# Patient Record
Sex: Male | Born: 1955
Health system: Southern US, Community
[De-identification: ages and names within clinical notes are randomized; demographics above are authoritative.]

## PROBLEM LIST (undated history)

## (undated) ENCOUNTER — Encounter

## (undated) ENCOUNTER — Telehealth

## (undated) ENCOUNTER — Encounter: Attending: Infectious Disease | Primary: Infectious Disease

## (undated) ENCOUNTER — Ambulatory Visit

## (undated) ENCOUNTER — Telehealth: Attending: Clinical | Primary: Clinical

## (undated) ENCOUNTER — Ambulatory Visit: Payer: MEDICARE | Attending: Infectious Disease | Primary: Infectious Disease

## (undated) ENCOUNTER — Ambulatory Visit: Payer: Medicare (Managed Care) | Attending: Infectious Disease | Primary: Infectious Disease

## (undated) ENCOUNTER — Encounter: Payer: MEDICARE | Attending: Infectious Disease | Primary: Infectious Disease

## (undated) DIAGNOSIS — B20 Human immunodeficiency virus [HIV] disease: Secondary | ICD-10-CM

## (undated) DIAGNOSIS — M1990 Unspecified osteoarthritis, unspecified site: Secondary | ICD-10-CM

## (undated) DIAGNOSIS — K219 Gastro-esophageal reflux disease without esophagitis: Secondary | ICD-10-CM

## (undated) DIAGNOSIS — Z21 Asymptomatic human immunodeficiency virus [HIV] infection status: Secondary | ICD-10-CM

## (undated) DIAGNOSIS — F329 Major depressive disorder, single episode, unspecified: Secondary | ICD-10-CM

## (undated) DIAGNOSIS — F32A Depression, unspecified: Secondary | ICD-10-CM

## (undated) DIAGNOSIS — K5792 Diverticulitis of intestine, part unspecified, without perforation or abscess without bleeding: Secondary | ICD-10-CM

## (undated) DIAGNOSIS — I10 Essential (primary) hypertension: Secondary | ICD-10-CM

## (undated) DIAGNOSIS — H9192 Unspecified hearing loss, left ear: Secondary | ICD-10-CM

## (undated) HISTORY — PX: CARPAL TUNNEL RELEASE: SHX101

## (undated) HISTORY — PX: WISDOM TOOTH EXTRACTION: SHX21

## (undated) HISTORY — DX: Asymptomatic human immunodeficiency virus (hiv) infection status: Z21

## (undated) HISTORY — DX: Diverticulitis of intestine, part unspecified, without perforation or abscess without bleeding: K57.92

## (undated) HISTORY — DX: Essential (primary) hypertension: I10

## (undated) HISTORY — PX: COLONOSCOPY: SHX174

## (undated) HISTORY — DX: Human immunodeficiency virus (HIV) disease: B20

---

## 2008-02-16 ENCOUNTER — Ambulatory Visit: Payer: Self-pay | Admitting: Cardiology

## 2008-03-11 ENCOUNTER — Encounter: Payer: Self-pay | Admitting: Cardiology

## 2008-03-11 ENCOUNTER — Ambulatory Visit: Payer: Self-pay

## 2009-04-09 ENCOUNTER — Encounter: Admission: RE | Admit: 2009-04-09 | Discharge: 2009-04-09 | Payer: Self-pay | Admitting: Orthopaedic Surgery

## 2011-02-01 ENCOUNTER — Other Ambulatory Visit: Payer: Self-pay | Admitting: Family Medicine

## 2011-02-03 ENCOUNTER — Ambulatory Visit
Admission: RE | Admit: 2011-02-03 | Discharge: 2011-02-03 | Disposition: A | Discharge status: Home / Self Care | Payer: PRIVATE HEALTH INSURANCE | Source: Ambulatory Visit | Attending: Family Medicine | Admitting: Family Medicine

## 2011-04-06 NOTE — Assessment & Plan Note (Signed)
[facility]                            CARDIOLOGY OFFICE NOTE   LONN, IM                        MRN:          [id number]  DATE:02/16/2008                            DOB:          05/31/1956    Mr. Richard Velazquez is a pleasant 55 year old gentleman with past medical history  of HIV who presents for evaluation of hypertension and dyspnea.  The  patient states he has had an irregular heartbeat occasionally in the  past.  He is from [place].  However, over the past 6 months he states  his blood pressure has been in the 140/90 to to 150/100 range.  However  over the past 2 weeks it has returned to normal.  When his blood  pressure is elevated, he has complained of dyspnea on exertion which is  new for him.  There is no orthopnea, PND, pedal edema, presyncope,  syncope or chest pain.  He does occasionally have palpitations that are  not sustained.  Because of his blood pressure and dyspnea, he was  referred to be evaluated.  His medications include multiple supplements.  He is also on Norvir 100 mg daily, Viread 300 mg daily, Rayataz 300 mg  daily, and Epzicom 900 mg daily. He also takes lorazepam as needed,  Lomotil as needed, and ibuprofen as needed.  He has no known drug  allergies.   SOCIAL HISTORY:  Does not smoke or use drugs.  He rarely consumes  alcohol.  He obtained his HIV through sex by his report.  There has been  no blood transfusion.   FAMILY HISTORY:  Significant for congestive heart failure as well as  atrial fibrillation.   PAST MEDICAL HISTORY:  There is no diabetes mellitus or hyperlipidemia.  He has had elevated blood pressure in the past 6 months but not over the  past 2 weeks.  He has had pleurisy in the past.  He has had reflux as  well as diverticula.  He has had his wisdom teeth removed but no other  surgeries have been noted.  There is no other past medical history  noted.   REVIEW OF SYSTEMS:  He denies any fevers or chills  but he has had  occasional headaches.  There is no productive cough or hemoptysis.  No  dysphagia, odynophagia, melena or hematochezia.  There is no dysuria  inch of his or seizure activity.  There is no orthopnea, PND or pedal  edema.  He does occasionally some pain in his lower extremities.  Remaining systems are negative.   PHYSICAL EXAMINATION:  Today shows a blood pressure of 120/80.  His  pulse is 74, weight is 173 pounds.  He is well-developed, well-nourished distress.  Skin is warm, dry.  Does not appear depressed.  There is no peripheral  clubbing.  BACK:  Normal.  HEENT:  Normal.  Normal eyelids.  Neck is supple with a normal upstroke bilaterally.  No bruits noted.  There is no jugular distention and no thyromegaly.  Chest is clear to auscultation.  There is normal expansion.  CARDIOVASCULAR:  Regular rhythm.  Normal  S1-S2.  There are no murmurs,  rubs or gallops noted.  Abdominal series shows no tenderness to palpation.  There is no  hepatosplenomegaly.  I cannot palpate masses and there are no bruits  noted.  He is 2+ femoral pulses bilaterally bruits.  EXTREMITIES:  Show no edema palpate no cords.  He is 2+ dorsalis pedis  pulses bilaterally.  NEUROLOGIC:  Grossly intact.   Electrocardiogram shows sinus rhythm at 74.  There are no ST changes  noted.   DIAGNOSES:  1. Hypertension - Mr. Commisso states his blood pressure was in 140/90 to      150/100 for the preceding 6 months but in the past 2 weeks it has      improved.  He is now 120/80.  I have asked him to track this at      home and we can implement therapy if his diastolic runs greater      than 85 or systolic runs greater than 140.  He already observes      lifestyle modification (he does not consume significant amounts of      alcohol, he follows a strict diet, and does exercise occasionally).      I have asked him to contact us and if his blood pressure runs high      we could consider adding a beta blocker  both for hypertension and      his history of palpitations.  2. Dyspnea on exertion - we will schedule him to have stress      echocardiogram both to rule out coronary disease and quantify his      LV function (he does have HIV and we would need to exclude an HIV      cardiomyopathy, although I think is unlikely).  Note, he did travel      to [place] recently, but he states his dyspnea began well before      then and I therefore think pulmonary embolism is unlikely.  It      would not explain his symptoms as his travel occurred after the      onset of his symptoms.  He does have a history of a pulmonary      embolus 30 years ago.  3. Human immunodeficiency virus - managed per his infectious disease      specialist.   We will see him back on as-needed basis.     Madolyn Frieze Jens Som, MD, [facility]  Electronically Signed    BSC/MedQ  DD: 02/16/2008  DT: 02/17/2008  Job #: [id number]   cc:   Allena Napoleon

## 2012-06-07 ENCOUNTER — Other Ambulatory Visit: Payer: Self-pay | Admitting: Family Medicine

## 2012-06-07 ENCOUNTER — Ambulatory Visit (INDEPENDENT_AMBULATORY_CARE_PROVIDER_SITE_OTHER): Payer: PRIVATE HEALTH INSURANCE | Admitting: Family Medicine

## 2012-06-07 VITALS — BP 128/81 | HR 83 | Temp 97.7°F | Resp 17 | Ht 70.0 in | Wt 162.0 lb

## 2012-06-07 DIAGNOSIS — B20 Human immunodeficiency virus [HIV] disease: Secondary | ICD-10-CM

## 2012-06-07 DIAGNOSIS — Z21 Asymptomatic human immunodeficiency virus [HIV] infection status: Secondary | ICD-10-CM

## 2012-06-07 DIAGNOSIS — Z113 Encounter for screening for infections with a predominantly sexual mode of transmission: Secondary | ICD-10-CM

## 2012-06-07 DIAGNOSIS — R197 Diarrhea, unspecified: Secondary | ICD-10-CM

## 2012-06-07 DIAGNOSIS — R109 Unspecified abdominal pain: Secondary | ICD-10-CM

## 2012-06-07 LAB — POCT CBC
Granulocyte percent: 54.1 %G (ref 37–80)
HCT, POC: 49.5 % (ref 43.5–53.7)
Hemoglobin: 15.7 g/dL (ref 14.1–18.1)
Lymph, poc: 2.1 (ref 0.6–3.4)
MCH, POC: 30.8 pg (ref 27–31.2)
MCHC: 31.7 g/dL — AB (ref 31.8–35.4)
MCV: 97.2 fL — AB (ref 80–97)
MID (cbc): 0.6 (ref 0–0.9)
MPV: 10.1 fL (ref 0–99.8)
POC Granulocyte: 3.2 (ref 2–6.9)
POC LYMPH PERCENT: 36.1 %L (ref 10–50)
POC MID %: 9.8 %M (ref 0–12)
Platelet Count, POC: 201 10*3/uL (ref 142–424)
RBC: 5.09 M/uL (ref 4.69–6.13)
RDW, POC: 13.4 %
WBC: 5.9 10*3/uL (ref 4.6–10.2)

## 2012-06-07 NOTE — Progress Notes (Signed)
 [facility]:  Office Visit  Chief Complaint:  Chief Complaint  Patient presents with  . Abdominal Pain  . Fever  . Generalized Body Aches  . HIV Positive/AIDS    HPI: Richard Velazquez is a 56 y.o. male who complains of non-bloody diarrhea, since Monday, x 3x daily, took 1/2 lomotil. Stool nl-loose, brown colored.  Subjective fevers, generalized muscle aches. Took Ibuprofen with some relief. Feels better this AM. + quesy. Went to eat at buffet at [employer], chicken salad, asparagus on side and broccoli side , started within 3 hours of ingection of food.   Would like to do STD testing because is not monogamous. + hiv viral load unknown, cd4 >500  Past Medical History  Diagnosis Date  . HIV infection   . Diverticulitis   . Hypertension    History reviewed. No pertinent past surgical history. History   Social History  . Marital Status: Single    Spouse Name: N/A    Number of Children: N/A  . Years of Education: N/A   Social History Main Topics  . Smoking status: Never Smoker   . Smokeless tobacco: None  . Alcohol Use: Yes  . Drug Use: No  . Sexually Active: Yes   Other Topics Concern  . None   Social History Narrative  . None   No family history on file. Allergies  Allergen Reactions  . Clindamycin/Lincomycin Other (See Comments)    Tendonitis   . Levaquin (Levofloxacin In D5w) Other (See Comments)    Tendonitis     Prior to Admission medications   Medication Sig Start Date End Date Taking? Authorizing Provider  Ascorbic Acid (VITAMIN C) 100 MG tablet Take 100 mg by mouth daily.   Yes Historical Provider, MD  atazanavir (REYATAZ) 300 MG capsule Take 300 mg by mouth daily with breakfast.   Yes Historical Provider, MD  buPROPion (WELLBUTRIN SR) 150 MG 12 hr tablet Take 150 mg by mouth 2 (two) times daily.   Yes Historical Provider, MD  lisinopril-hydrochlorothiazide (PRINZIDE,ZESTORETIC) 10-12.5 MG per tablet Take 1 tablet by mouth daily.   Yes  Historical Provider, MD  ritonavir (NORVIR) 100 MG capsule Take by mouth 2 (two) times daily.   Yes Historical Provider, MD  tenofovir (VIREAD) 300 MG tablet Take 300 mg by mouth daily.   Yes Historical Provider, MD  vitamin B-12 (CYANOCOBALAMIN) 50 MCG tablet Take 50 mcg by mouth daily.   Yes Historical Provider, MD  Vitamin D, Ergocalciferol, (DRISDOL) 50000 UNITS CAPS Take 50,000 Units by mouth.   Yes Historical Provider, MD     ROS: The patient denies chills, night sweats, unintentional weight loss, chest pain, palpitations, wheezing, dyspnea on exertion,  vomiting, dysuria, hematuria, melena, numbness, weakness, or tingling.   All other systems have been reviewed and were otherwise negative with the exception of those mentioned in the HPI and as above.    PHYSICAL EXAM: Filed Vitals:   06/07/12 0853  BP: 128/81  Pulse: 83  Temp: 97.7 F (36.5 C)  Resp: 17   Filed Vitals:   06/07/12 0853  Height: 5\' 10"  (1.778 m)  Weight: 162 lb (73.483 kg)   Body mass index is 23.24 kg/(m^2).  General: Alert, no acute distress HEENT:  Normocephalic, atraumatic, oropharynx patent.  Cardiovascular:  Regular rate and rhythm, no rubs murmurs or gallops.  No Carotid bruits, radial pulse intact. No pedal edema.  Respiratory: Clear to auscultation bilaterally.  No wheezes, rales, or rhonchi.  No cyanosis,  no use of accessory musculature GI: No organomegaly, abdomen is soft and non-tender, positive bowel sounds.  No masses. Skin: No rashes. Neurologic: Facial musculature symmetric. Psychiatric: Patient is appropriate throughout our interaction. Lymphatic: No cervical lymphadenopathy Musculoskeletal: Gait intact.   LABS: Results for orders placed in visit on 06/07/12  POCT CBC      Component Value Range   WBC 5.9  4.6 - 10.2 K/uL   Lymph, poc 2.1  0.6 - 3.4   POC LYMPH PERCENT 36.1  10 - 50 %L   MID (cbc) 0.6  0 - 0.9   POC MID % 9.8  0 - 12 %M   POC Granulocyte 3.2  2 - 6.9    Granulocyte percent 54.1  37 - 80 %G   RBC 5.09  4.69 - 6.13 M/uL   Hemoglobin 15.7  14.1 - 18.1 g/dL   HCT, POC 16.1  09.6 - 53.7 %   MCV 97.2 (*) 80 - 97 fL   MCH, POC 30.8  27 - 31.2 pg   MCHC 31.7 (*) 31.8 - 35.4 g/dL   RDW, POC 04.5     Platelet Count, POC 201  142 - 424 K/uL   MPV 10.1  0 - 99.8 fL     EKG/XRAY:   Primary read interpreted by Dr. Conley Rolls at [facility].   ASSESSMENT/PLAN: Encounter Diagnoses  Name Primary?  . Abdominal  pain, other specified site Yes  . Diarrhea   . HIV (human immunodeficiency virus infection)   . Screening for STD (sexually transmitted disease)     [facility] labs: CMP, stool cx, RPR, G/C uriprobe Viral vs bacterial gastroenteritis- will continue to monitor. BRAT. F/u prn if sxs worsen.   ,  PHUONG, DO 06/07/2012 11:18 AM

## 2012-06-15 ENCOUNTER — Telehealth: Payer: Self-pay

## 2012-06-15 ENCOUNTER — Other Ambulatory Visit (INDEPENDENT_AMBULATORY_CARE_PROVIDER_SITE_OTHER): Payer: PRIVATE HEALTH INSURANCE | Admitting: Family Medicine

## 2012-06-15 ENCOUNTER — Other Ambulatory Visit: Payer: Self-pay | Admitting: Family Medicine

## 2012-06-15 ENCOUNTER — Telehealth: Payer: Self-pay | Admitting: Family Medicine

## 2012-06-15 DIAGNOSIS — R899 Unspecified abnormal finding in specimens from other organs, systems and tissues: Secondary | ICD-10-CM

## 2012-06-15 DIAGNOSIS — Z113 Encounter for screening for infections with a predominantly sexual mode of transmission: Secondary | ICD-10-CM

## 2012-06-15 DIAGNOSIS — R6889 Other general symptoms and signs: Secondary | ICD-10-CM

## 2012-06-15 LAB — COMPREHENSIVE METABOLIC PANEL
Albumin/Globulin Ratio: 2 (ref 1.1–2.5)
Albumin: 4.7 g/dL (ref 3.5–5.5)
Alkaline Phosphatase: 107 IU/L — ABNORMAL HIGH (ref 44–102)
BUN/Creatinine Ratio: 16 (ref 9–20)
BUN: 19 mg/dL (ref 6–24)
CO2: 26 mmol/L (ref 19–28)
Creatinine, Ser: 1.19 mg/dL (ref 0.76–1.27)
GFR calc non Af Amer: 68 mL/min/{1.73_m2} (ref >59)
Globulin, Total: 2.3 g/dL (ref 1.5–4.5)

## 2012-06-15 LAB — GC/CHLAMYDIA PROBE AMP

## 2012-06-15 LAB — COMPREHENSIVE METABOLIC PANEL WITH GFR
ALT: 27 IU/L (ref 0–44)
AST: 24 IU/L (ref 0–40)
Calcium: 9.9 mg/dL (ref 8.7–10.2)
Chloride: 100 mmol/L (ref 97–108)
GFR calc Af Amer: 79 mL/min/{1.73_m2} (ref >59)
Glucose: 100 mg/dL — ABNORMAL HIGH (ref 65–99)
Potassium: 4.2 mmol/L (ref 3.5–5.2)
Sodium: 137 mmol/L (ref 134–144)
Total Bilirubin: 2.4 mg/dL — ABNORMAL HIGH (ref 0.0–1.2)
Total Protein: 7 g/dL (ref 6.0–8.5)

## 2012-06-15 LAB — STOOL CULTURE: E coli, Shiga toxin Assay: NEGATIVE

## 2012-06-15 LAB — TEST CODE CHANGE

## 2012-06-15 LAB — RPR: RPR: NONREACTIVE

## 2012-06-15 LAB — SPECIMEN STATUS REPORT

## 2012-06-15 NOTE — Telephone Encounter (Signed)
Patient will be coming in to get repeat labs today : GC urine and CMP. [facility] only. Order already put in. Does not need office visit. GC last collected deteriorated in transmit.  Repeat CMP for abnl results.

## 2012-06-21 ENCOUNTER — Other Ambulatory Visit: Payer: Self-pay | Admitting: Family Medicine

## 2012-06-21 DIAGNOSIS — R197 Diarrhea, unspecified: Secondary | ICD-10-CM

## 2012-06-21 MED ORDER — DIPHENOXYLATE-ATROPINE 2.5-0.025 MG PO TABS: 1.0000 | ORAL_TABLET | Freq: Four times a day (QID) | ORAL | Status: AC | PRN

## 2012-06-22 LAB — COMPREHENSIVE METABOLIC PANEL WITH GFR
ALT: 23 IU/L (ref 0–44)
Albumin/Globulin Ratio: 2 (ref 1.1–2.5)
Albumin: 4.5 g/dL (ref 3.5–5.5)
BUN: 17 mg/dL (ref 6–24)
CO2: 24 mmol/L (ref 19–28)
Calcium: 9.7 mg/dL (ref 8.7–10.2)
Creatinine, Ser: 1.11 mg/dL (ref 0.76–1.27)
GFR calc non Af Amer: 74 mL/min/{1.73_m2} (ref >59)
Globulin, Total: 2.2 g/dL (ref 1.5–4.5)
Sodium: 138 mmol/L (ref 134–144)
Total Protein: 6.7 g/dL (ref 6.0–8.5)

## 2012-06-22 LAB — AMBIG ABBREV CMP14 DEFAULT

## 2012-06-22 LAB — COMPREHENSIVE METABOLIC PANEL
AST: 23 IU/L (ref 0–40)
Alkaline Phosphatase: 104 IU/L — ABNORMAL HIGH (ref 44–102)
BUN/Creatinine Ratio: 15 (ref 9–20)
Chloride: 100 mmol/L (ref 97–108)
GFR calc Af Amer: 86 mL/min/{1.73_m2} (ref >59)
Glucose: 90 mg/dL (ref 65–99)
Potassium: 4.1 mmol/L (ref 3.5–5.2)
Total Bilirubin: 1.8 mg/dL — ABNORMAL HIGH (ref 0.0–1.2)

## 2012-06-22 LAB — GC/CHLAMYDIA PROBE AMP
Chlamydia trachomatis, NAA: NEGATIVE
Neisseria gonorrhoeae by PCR: NEGATIVE

## 2012-07-10 NOTE — Telephone Encounter (Signed)
ERROR

## 2012-07-18 ENCOUNTER — Ambulatory Visit (INDEPENDENT_AMBULATORY_CARE_PROVIDER_SITE_OTHER): Payer: PRIVATE HEALTH INSURANCE | Admitting: Family Medicine

## 2012-07-18 VITALS — BP 109/72 | HR 76 | Temp 97.9°F | Resp 18 | Ht 70.0 in | Wt 166.0 lb

## 2012-07-18 DIAGNOSIS — N489 Disorder of penis, unspecified: Secondary | ICD-10-CM

## 2012-07-18 DIAGNOSIS — N4889 Other specified disorders of penis: Secondary | ICD-10-CM

## 2012-07-18 DIAGNOSIS — B20 Human immunodeficiency virus [HIV] disease: Secondary | ICD-10-CM

## 2012-07-18 DIAGNOSIS — J029 Acute pharyngitis, unspecified: Secondary | ICD-10-CM

## 2012-07-18 LAB — POCT RAPID STREP A (OFFICE): Rapid Strep A Screen: NEGATIVE

## 2012-07-18 MED ORDER — AMOXICILLIN 500 MG PO CAPS: 500.0000 mg | ORAL_CAPSULE | Freq: Three times a day (TID) | ORAL | Status: AC

## 2012-07-18 MED ORDER — MUPIROCIN 2 % EX OINT: TOPICAL_OINTMENT | Freq: Three times a day (TID) | CUTANEOUS | Status: AC

## 2012-07-18 NOTE — Patient Instructions (Addendum)
1. Pharyngitis  POCT rapid strep A, Culture, Group A Strep, amoxicillin (AMOXIL) 500 MG capsule  2. Penile lesion  Herpes culture, rapid, mupirocin ointment (BACTROBAN) 2 %

## 2012-07-18 NOTE — Progress Notes (Signed)
Subjective:    Patient ID: Richard Velazquez, male    DOB: 1956-04-24, 56 y.o.   MRN: [id number]  HPI This 56 y.o. male presents for evaluation of the following:  1.  Sore throat:  Onset of R sided sore throat yesterday; actually pain improved today 25%; [place] and felt like needed to be evaluated.  Gland also tender on L.  No fever/chills/sweats.  No headache; no ear pain but +ear itching intermittently.  No nasal congestion, rhinorrhea, cough.  No malaise.  No nausea, vomiting, rash.    2.  Penile skin lesions:  Has appointment with dermatology next week to evaluate lesion on penis that has been present for 18 months;.  Small spot that has been there for a very long time.  Protopic cream prescribed by dermatology; has applied to area.  Also has an area that is sore and not healing properly.  No drainage.  +new sexual partners; no penetration; not worried about STD; worried about abrasions.  Onset of abrasion 3 days ago; less sore now than with onset.  Has been applying topical antibacterial cream to area with improvement/decreased pain.  Not circumcised so has constant moisture under foreskin.  3.  HIV: very active sexually/intimately yet no penetration.  Tries to be careful.      Review of Systems  Constitutional: Negative for fever, chills, diaphoresis and fatigue.  HENT: Positive for ear pain, sore throat and neck pain. Negative for hearing loss, congestion, rhinorrhea, sneezing, mouth sores, trouble swallowing, neck stiffness, voice change, postnasal drip and ear discharge.   Respiratory: Negative for cough and shortness of breath.   Gastrointestinal: Negative for abdominal pain.  Genitourinary: Positive for genital sores and penile pain. Negative for dysuria, frequency, hematuria, flank pain, discharge, penile swelling, scrotal swelling, difficulty urinating and testicular pain.  Skin: Positive for wound.    Past Medical History  Diagnosis Date  . HIV  infection   . Diverticulitis   . Hypertension     No past surgical history on file.  Prior to Admission medications   Medication Sig Start Date End Date Taking? Authorizing Provider  Ascorbic Acid (VITAMIN C) 100 MG tablet Take 100 mg by mouth daily.   Yes Historical Provider, MD  atazanavir (REYATAZ) 300 MG capsule Take 300 mg by mouth daily with breakfast.   Yes Historical Provider, MD  buPROPion (WELLBUTRIN SR) 150 MG 12 hr tablet Take 150 mg by mouth 2 (two) times daily.   Yes Historical Provider, MD  lisinopril-hydrochlorothiazide (PRINZIDE,ZESTORETIC) 10-12.5 MG per tablet Take 1 tablet by mouth daily.   Yes Historical Provider, MD  ritonavir (NORVIR) 100 MG capsule Take by mouth 2 (two) times daily.   Yes Historical Provider, MD  tenofovir (VIREAD) 300 MG tablet Take 300 mg by mouth daily.   Yes Historical Provider, MD  vitamin B-12 (CYANOCOBALAMIN) 50 MCG tablet Take 50 mcg by mouth daily.   Yes Historical Provider, MD  Vitamin D, Ergocalciferol, (DRISDOL) 50000 UNITS CAPS Take 50,000 Units by mouth.   Yes Historical Provider, MD  amoxicillin (AMOXIL) 500 MG capsule Take 1 capsule (500 mg total) by mouth 3 (three) times daily. 07/18/12 07/28/12  Ethelda Chick, MD  mupirocin ointment (BACTROBAN) 2 % Apply topically 3 (three) times daily. 07/18/12 07/25/12  Ethelda Chick, MD    Allergies  Allergen Reactions  . Clindamycin/Lincomycin Other (See Comments)    Tendonitis   . Levaquin (Levofloxacin In D5w) Other (See Comments)  Tendonitis      History   Social History  . Marital Status: Single    Spouse Name: N/A    Number of Children: N/A  . Years of Education: N/A   Occupational History  . Not on file.   Social History Main Topics  . Smoking status: Never Smoker   . Smokeless tobacco: Not on file  . Alcohol Use: Yes  . Drug Use: No  . Sexually Active: Yes   Other Topics Concern  . Not on file   Social History Narrative  . No narrative on file    No family  history on file.     Objective:   Physical Exam  Nursing note and vitals reviewed. Constitutional: He is oriented to person, place, and time. He appears well-developed and well-nourished. No distress.  HENT:  Head: Normocephalic and atraumatic.  Right Ear: External ear normal.  Left Ear: External ear normal.  Nose: Nose normal.  Mouth/Throat: Oropharynx is clear and moist and mucous membranes are normal. No oral lesions. No uvula swelling. No oropharyngeal exudate or tonsillar abscesses.       MILD ERYTHEMA R OP; NO EXUDATE; NO UVULA DEVIATION.  NO BUCCAL LESIONS.  Eyes: Conjunctivae and EOM are normal. Pupils are equal, round, and reactive to light.  Neck: Normal range of motion. Neck supple. No thyromegaly present.       MILD R ANTERIOR CERVICAL LAD.  Cardiovascular: Normal rate, regular rhythm, normal heart sounds and intact distal pulses.   Pulmonary/Chest: Effort normal and breath sounds normal.  Genitourinary: Uncircumcised. No penile erythema or penile tenderness. No discharge found.       SINGLE PAPULAR LESION L LATERAL PENILE SHAFT DIAMETER WITHOUT ULCERATION, VESICLE, PUSTULE, OR ERYTHEMA.  DISTAL PENILE SHAFT L, WITH SUPERFICIAL X ABRASION; NO ASSOCIATED ULCERATION, PUSTULE, OR VESICLE.   HSV CULTURE OBTAINED.  Lymphadenopathy:    He has cervical adenopathy.  Neurological: He is alert and oriented to person, place, and time. He has normal reflexes.  Skin: He is not diaphoretic.  Psychiatric: He has a normal mood and affect. His behavior is normal. Judgment and thought content normal.       Results for orders placed in visit on 07/18/12  POCT RAPID STREP A (OFFICE)      Component Value Range   Rapid Strep A Screen Negative  Negative    Assessment & Plan:   1. Pharyngitis  POCT rapid strep A, Culture, Group A Strep, amoxicillin (AMOXIL) 500 MG capsule  2. Penile lesion  mupirocin ointment (BACTROBAN) 2 %, Herpes simplex virus culture  3. HIV  positive   1.  Pharyngitis:  New.  Send throat culture.  Treat empirically with Amoxicillin.  If persists, will warrant throat sample for GC/Chlam. 2.  Penile Lesion: New. Two separate lesions.  Papular lesion  L lateral penile shaft; scheduled for evaluation by dermatology in upcoming week.  Superficial abrasion distal penile shaft; rx for Bactroban provided.  HSV culture also obtained.  RTC for acute worsening or lack of healing. 3.  HIV positive status: stable.

## 2012-07-19 NOTE — Progress Notes (Signed)
Reviewed and agree.

## 2012-07-20 LAB — CULTURE, GROUP A STREP: Organism ID, Bacteria: NORMAL

## 2012-07-20 LAB — HERPES SIMPLEX VIRUS CULTURE: Organism ID, Bacteria: NOT DETECTED

## 2013-02-10 ENCOUNTER — Ambulatory Visit (INDEPENDENT_AMBULATORY_CARE_PROVIDER_SITE_OTHER): Payer: PRIVATE HEALTH INSURANCE | Admitting: Family Medicine

## 2013-02-10 VITALS — BP 108/74 | HR 70 | Temp 98.2°F | Resp 16 | Ht 70.0 in | Wt 160.0 lb

## 2013-02-10 DIAGNOSIS — B029 Zoster without complications: Secondary | ICD-10-CM

## 2013-02-10 DIAGNOSIS — H6120 Impacted cerumen, unspecified ear: Secondary | ICD-10-CM

## 2013-02-10 DIAGNOSIS — H6121 Impacted cerumen, right ear: Secondary | ICD-10-CM

## 2013-02-10 DIAGNOSIS — B370 Candidal stomatitis: Secondary | ICD-10-CM

## 2013-02-10 MED ORDER — FLUCONAZOLE 150 MG PO TABS: 150.0000 mg | ORAL_TABLET | Freq: Once | ORAL | Status: DC

## 2013-02-10 MED ORDER — VALACYCLOVIR HCL 1 G PO TABS: 1000.0000 mg | ORAL_TABLET | Freq: Three times a day (TID) | ORAL | Status: DC

## 2013-02-10 NOTE — Progress Notes (Signed)
57 yo HIV patient with left groin and hip skin sensitivity starting last night. Last CD4 count was over 500. Throat has been husky with throat irritation x 1 month Hasn't been exercising this winter  Also having scaly irritated right ear with some popping Also having physical therapy for L4L5 disc bulging, right ear noise and itchiness.  Objective:  NAD  Mild adenopathy left groin without any significant rash. Right ear shows cerumen impaction Throat: few small exudates Ear was cleared on the right and 90% clear on left  Assessment: early thrush, shingles. Cerumen impaction  Plan:Oral thrush - Plan: fluconazole (DIFLUCAN) 150 MG tablet  Shingles - Plan: valACYclovir (VALTREX) 1000 MG tablet  Cerumen impaction, right   Oral thrush - Plan: fluconazole (DIFLUCAN) 150 MG tablet  Shingles - Plan: valACYclovir (VALTREX) 1000 MG tablet

## 2013-03-01 DIAGNOSIS — F329 Major depressive disorder, single episode, unspecified: Secondary | ICD-10-CM | POA: Insufficient documentation

## 2013-03-01 DIAGNOSIS — M5116 Intervertebral disc disorders with radiculopathy, lumbar region: Secondary | ICD-10-CM | POA: Insufficient documentation

## 2013-03-01 DIAGNOSIS — G5600 Carpal tunnel syndrome, unspecified upper limb: Secondary | ICD-10-CM | POA: Insufficient documentation

## 2013-03-01 DIAGNOSIS — F32A Depression, unspecified: Secondary | ICD-10-CM | POA: Insufficient documentation

## 2013-07-19 ENCOUNTER — Ambulatory Visit: Payer: Medicare Other | Attending: Orthopaedic Surgery | Admitting: Physical Therapy

## 2013-07-19 DIAGNOSIS — M25519 Pain in unspecified shoulder: Secondary | ICD-10-CM | POA: Insufficient documentation

## 2013-07-19 DIAGNOSIS — M25619 Stiffness of unspecified shoulder, not elsewhere classified: Secondary | ICD-10-CM | POA: Insufficient documentation

## 2013-07-19 DIAGNOSIS — M6530 Trigger finger, unspecified finger: Secondary | ICD-10-CM | POA: Insufficient documentation

## 2013-07-19 DIAGNOSIS — IMO0001 Reserved for inherently not codable concepts without codable children: Secondary | ICD-10-CM | POA: Insufficient documentation

## 2013-07-25 ENCOUNTER — Ambulatory Visit: Payer: Medicare Other | Attending: Orthopaedic Surgery | Admitting: Physical Therapy

## 2013-07-25 DIAGNOSIS — M25619 Stiffness of unspecified shoulder, not elsewhere classified: Secondary | ICD-10-CM | POA: Insufficient documentation

## 2013-07-25 DIAGNOSIS — M6530 Trigger finger, unspecified finger: Secondary | ICD-10-CM | POA: Insufficient documentation

## 2013-07-25 DIAGNOSIS — M25519 Pain in unspecified shoulder: Secondary | ICD-10-CM | POA: Insufficient documentation

## 2013-07-25 DIAGNOSIS — IMO0001 Reserved for inherently not codable concepts without codable children: Secondary | ICD-10-CM | POA: Insufficient documentation

## 2013-07-26 ENCOUNTER — Ambulatory Visit: Payer: Medicare Other | Admitting: Physical Therapy

## 2013-07-31 ENCOUNTER — Ambulatory Visit: Payer: Medicare Other | Admitting: Physical Therapy

## 2013-08-02 ENCOUNTER — Ambulatory Visit: Payer: Medicare Other | Admitting: Physical Therapy

## 2013-08-07 ENCOUNTER — Ambulatory Visit: Payer: Medicare Other | Admitting: Rehabilitation

## 2013-08-09 ENCOUNTER — Ambulatory Visit: Payer: Medicare Other | Admitting: Rehabilitation

## 2013-09-20 DIAGNOSIS — K5792 Diverticulitis of intestine, part unspecified, without perforation or abscess without bleeding: Secondary | ICD-10-CM | POA: Insufficient documentation

## 2013-09-20 DIAGNOSIS — R5383 Other fatigue: Secondary | ICD-10-CM | POA: Insufficient documentation

## 2013-09-20 DIAGNOSIS — N419 Inflammatory disease of prostate, unspecified: Secondary | ICD-10-CM | POA: Insufficient documentation

## 2013-09-28 ENCOUNTER — Ambulatory Visit: Payer: PRIVATE HEALTH INSURANCE

## 2013-10-03 ENCOUNTER — Encounter: Payer: Self-pay | Admitting: Radiology

## 2013-11-04 ENCOUNTER — Ambulatory Visit (INDEPENDENT_AMBULATORY_CARE_PROVIDER_SITE_OTHER): Payer: PRIVATE HEALTH INSURANCE | Admitting: Emergency Medicine

## 2013-11-04 ENCOUNTER — Ambulatory Visit: Payer: PRIVATE HEALTH INSURANCE

## 2013-11-04 VITALS — BP 120/68 | HR 99 | Temp 98.9°F | Resp 12 | Ht 69.75 in | Wt 165.0 lb

## 2013-11-04 DIAGNOSIS — R109 Unspecified abdominal pain: Secondary | ICD-10-CM

## 2013-11-04 DIAGNOSIS — N419 Inflammatory disease of prostate, unspecified: Secondary | ICD-10-CM

## 2013-11-04 DIAGNOSIS — B20 Human immunodeficiency virus [HIV] disease: Secondary | ICD-10-CM

## 2013-11-04 DIAGNOSIS — N390 Urinary tract infection, site not specified: Secondary | ICD-10-CM

## 2013-11-04 LAB — POCT CBC
HCT, POC: 46 % (ref 43.5–53.7)
Hemoglobin: 14.9 g/dL (ref 14.1–18.1)
Lymph, poc: 2.1 (ref 0.6–3.4)
MCH, POC: 32 pg — AB (ref 27–31.2)
MCHC: 32.4 g/dL (ref 31.8–35.4)
MID (cbc): 0.6 (ref 0–0.9)
POC Granulocyte: 7.3 — AB (ref 2–6.9)
RBC: 4.65 M/uL — AB (ref 4.69–6.13)
WBC: 10 10*3/uL (ref 4.6–10.2)

## 2013-11-04 LAB — COMPREHENSIVE METABOLIC PANEL
ALT: 26 U/L (ref 0–53)
Albumin: 4.5 g/dL (ref 3.5–5.2)
Alkaline Phosphatase: 76 U/L (ref 39–117)
BUN: 17 mg/dL (ref 6–23)
CO2: 29 mEq/L (ref 19–32)
Glucose, Bld: 90 mg/dL (ref 70–99)
Potassium: 4.3 mEq/L (ref 3.5–5.3)
Sodium: 137 mEq/L (ref 135–145)

## 2013-11-04 LAB — POCT UA - MICROSCOPIC ONLY
Casts, Ur, LPF, POC: NEGATIVE
Crystals, Ur, HPF, POC: NEGATIVE
Mucus, UA: POSITIVE

## 2013-11-04 LAB — POCT URINALYSIS DIPSTICK
Bilirubin, UA: NEGATIVE
Ketones, UA: NEGATIVE
Nitrite, UA: NEGATIVE
Spec Grav, UA: 1.02
pH, UA: 7

## 2013-11-04 LAB — IFOBT (OCCULT BLOOD): IFOBT: NEGATIVE

## 2013-11-04 MED ORDER — CEPHALEXIN 500 MG PO CAPS: 500.0000 mg | ORAL_CAPSULE | Freq: Four times a day (QID) | ORAL | Status: DC

## 2013-11-04 NOTE — Patient Instructions (Signed)
Prostatitis The prostate gland is about the size and shape of a walnut. It is located just below your bladder. It produces one of the components of semen, which is made up of sperm and the fluids that help nourish and transport it out from the testicles. Prostatitis is redness, soreness, and swelling (inflammation) of the prostate gland.  There are 3 types of prostatitis:  Acute bacterial prostatitis This is the least common type of prostatitis. It starts quickly and usually leads to a bladder infection. It can occur at any age.  Chronic bacterial prostatitis This is a persistent bacterial infection in the prostate. It usually develops from repeated acute bacterial prostatitis or acute bacterial prostatitis that was not properly treated. It can occur in men of any age but is most common in middle-aged men whose prostate has begun to enlarge.   Chronic prostatitis chronic pelvic pain syndrome This is the most common type of prostatitis. It is inflammation of the prostate gland that is not caused by a bacterial infection. The cause is unknown. CAUSES The cause of acute and chronic bacterial prostatitis is a bacterial infection. The exact cause of chronic prostatitis and chronic pelvic pain syndrome and asymptomatic inflammatory prostatitis is unknown.  SYMPTOMS  Symptoms can vary depending upon the type of prostatitis that exists. There can also be overlap in symptoms. Possible symptoms for each type of prostatitis are listed below. Acute bacterial prostatitis  Painful urination.  Fever or chills.  Muscle or joint pains.  Low back pain.  Low abdominal pain.  Inability to empty bladder completely.  Sudden urge to urinate.  Frequent urination.  Difficulty starting urine stream.  Weak urine stream.  Discharge from the urethra.  Dribbling after urination.  Rectal pain.  Pain in the testicles, penis, or tip of the penis.  Pain in the space between the anus and scrotum  (perineum).  Problems with sexual function.  Painful ejaculation.  Bloody semen. Chronic bacterial prostatitis  The symptoms are similar to those of acute bacterial prostatitis, but they usually are much less severe. Fever, chills, and muscle and joint pain are not associated with chronic bacterial prostatitis. Chronic prostatitis chronic pelvic pain syndrome  Symptoms typically include a dull ache in the scrotum and the perineum. DIAGNOSIS  In order to diagnose prostatitis, your caregiver will ask about your symptoms. If acute or chronic bacterial prostatitis is suspected, a urine sample will be taken and tested (urinalysis). This is to see if there is bacteria in your urine. If the urinalysis result is negative for bacteria, your caregiver may use a finger to feel your prostate (digital rectal exam). This exam helps your caregiver determine if your prostate is swollen and tender. TREATMENT  Treatment for prostatitis depends on the cause. If a bacterial infection is the cause, it can be treated with antibiotic medicine. In cases of chronic bacterial prostatitis, the use of antibiotics for up to 1 month may be necessary. Your caregiver may instruct you to take sitz baths to help relieve pain. A sitz bath is a bath of hot water in which your hips and buttocks are under water. HOME CARE INSTRUCTIONS   Take all medicines as directed by your caregiver.  Take sitz baths as directed by your caregiver. SEEK MEDICAL CARE IF:   Your symptoms get worse, not better.  You have a fever. SEEK IMMEDIATE MEDICAL CARE IF:   You have chills.  You feel nauseous or vomit.  You feel lightheaded or faint.  You are unable to  urinate.  You have blood or blood clots in your urine. Document Released: 11/05/2000 Document Revised: 01/31/2012 Document Reviewed: 05/28/2013 [facility] Patient Information 2014 [place], [place].

## 2013-11-04 NOTE — Progress Notes (Addendum)
Subjective:   This chart was scribed for Richard Chris, MD at [facility] by Yevette Edwards, Scribe. This pt's care was started at 9:42 AM.   Patient ID: Richard Velazquez, male    DOB: Aug 26, 1956, 57 y.o.   MRN: [id number]  Chief Complaint  Patient presents with  . Abdominal Pain    hx diverticulitis - does not feel like that - x3 days - lower quadrants  . Dysuria    frequency & poor stream    HPI  Richard Velazquez is a 56 y.o. male, with a h/o diverticulitis and HIV infection, who presents to [facility] complaining of suprapubic abdominal pain which radiates into his lower quadrants and which began three days ago. The pt characterizes the pain as if he "had been kicked in the stomach," and he reports the abdominal pain has interfered with his sleep. As associated symptoms, the pt has also experienced increased flatulence, a fever, constipation, decreased urination, and frequency. His temperature at bedside is 98.9 F. He denies emesis, diarrhea, and dysuria.  He states the current pain is located in a different abdominal region than previous episodes of diverticulitis  The pt states that he recently had several medications changed, and he began Truvada approximately a month ago. He experienced a headache as a side effect of Truvada, and he questions if the current abdominal pain is also a side effect. The headache has since resolved. His last CD-4 count was in the 400s.   The pt had a colonoscopy within the past ten years; Dr. Fortunato Curling performed the colonoscopy.   The pt's ID physician is in [place].   Past Medical History  Diagnosis Date  . HIV infection   . Diverticulitis   . Hypertension     Current Outpatient Prescriptions on File Prior to Visit  Medication Sig Dispense Refill  . 5-Hydroxytryptophan (5-HTP PO) Take by mouth.      . Ascorbic Acid (VITAMIN C) 100 MG tablet Take 100 mg by mouth daily.      Marland Kitchen atazanavir (REYATAZ) 300 MG capsule Take 300 mg by mouth daily  with breakfast.      . buPROPion (WELLBUTRIN SR) 150 MG 12 hr tablet Take 150 mg by mouth 2 (two) times daily.      . diphenoxylate-atropine (LOMOTIL) 2.5-0.025 MG/5ML liquid Take 5 mLs by mouth 4 (four) times daily as needed.      . fish oil-omega-3 fatty acids 1000 MG capsule Take 2 g by mouth daily.      Marland Kitchen lisinopril-hydrochlorothiazide (PRINZIDE,ZESTORETIC) 10-12.5 MG per tablet Take 1 tablet by mouth daily.      Marland Kitchen LORazepam (ATIVAN) 0.5 MG tablet Take 0.5 mg by mouth every 8 (eight) hours.      . Melatonin 10 MG TABS Take by mouth.      . ritonavir (NORVIR) 100 MG capsule Take by mouth 2 (two) times daily.      . vitamin B-12 (CYANOCOBALAMIN) 50 MCG tablet Take 50 mcg by mouth daily.      . Vitamin D, Ergocalciferol, (DRISDOL) 50000 UNITS CAPS Take 50,000 Units by mouth.      Marland Kitchen abacavir-lamiVUDine (EPZICOM) 600-300 MG per tablet Take 1 tablet by mouth daily.      . fluconazole (DIFLUCAN) 150 MG tablet Take 1 tablet (150 mg total) by mouth once.  1 tablet  0  . tenofovir (VIREAD) 300 MG tablet Take 300 mg by mouth daily.      . valACYclovir (VALTREX) 1000  MG tablet Take 1 tablet (1,000 mg total) by mouth 3 (three) times daily.  21 tablet  0   No current facility-administered medications on file prior to visit.    Review of Systems  Constitutional: Positive for fever.  Gastrointestinal: Positive for abdominal pain and constipation. Negative for vomiting and diarrhea.  Genitourinary: Positive for frequency and decreased urine volume. Negative for dysuria.       Objective:   Physical Exam   Physical Exam  Nursing note and vitals reviewed. Constitutional: He is oriented to person, place, and time. He appears well-developed and well-nourished. No distress.  HENT:  Head: Normocephalic and atraumatic.  Eyes: EOM are normal.  Neck: Neck supple. No tracheal deviation present.  Cardiovascular: Normal rate.   Pulmonary/Chest: Effort normal. No respiratory distress.  Abdomen is soft.  Bowel sounds are present. There is significant tenderness right and left lower abdomen as well as the suprapubic area. Prostate is normal size only mild tenderness. There are no masses felt on rectal exam.  Musculoskeletal: Normal range of motion.  Neurological: He is alert and oriented to person, place, and time.  Skin: Skin is warm and dry.  Psychiatric: He has a normal mood and affect. His behavior is normal.  [facility] reading (PRIMARY) by  Dr.Jelicia Nantz there is no free air. There is significant constipation. Chest x-ray is normal. There is no evidence of bowel obstruction Results for orders placed in visit on 11/04/13  POCT CBC      Result Value Range   WBC 10.0  4.6 - 10.2 K/uL   Lymph, poc 2.1  0.6 - 3.4   POC LYMPH PERCENT 20.9  10 - 50 %L   MID (cbc) 0.6  0 - 0.9   POC MID % 6.5  0 - 12 %M   POC Granulocyte 7.3 (*) 2 - 6.9   Granulocyte percent 72.6  37 - 80 %G   RBC 4.65 (*) 4.69 - 6.13 M/uL   Hemoglobin 14.9  14.1 - 18.1 g/dL   HCT, POC 40.9  81.1 - 53.7 %   MCV 98.9 (*) 80 - 97 fL   MCH, POC 32.0 (*) 27 - 31.2 pg   MCHC 32.4  31.8 - 35.4 g/dL   RDW, POC 91.4     Platelet Count, POC 184  142 - 424 K/uL   MPV 10.1  0 - 99.8 fL  POCT URINALYSIS DIPSTICK      Result Value Range   Color, UA orange     Clarity, UA cloudy     Glucose, UA neg     Bilirubin, UA neg     Ketones, UA neg     Spec Grav, UA 1.020     Blood, UA trace     pH, UA 7.0     Protein, UA 30 mg     Urobilinogen, UA 0.2     Nitrite, UA neg     Leukocytes, UA small (1+)    POCT UA - MICROSCOPIC ONLY      Result Value Range   WBC, Ur, HPF, POC 20-TNTC     RBC, urine, microscopic 10-TNTC     Bacteria, U Microscopic 2+     Mucus, UA pos     Epithelial cells, urine per micros 0-4     Crystals, Ur, HPF, POC neg     Casts, Ur, LPF, POC neg     Yeast, UA neg     Renal tubular cells Pos  Vitals: BP 120/68  Pulse 99  Temp(Src) 98.9 F (37.2 C) (Oral)  Resp 12  Ht 5' 9.75" (1.772 m)  Wt 165 lb  (74.844 kg)  BMI 23.84 kg/m2  SpO2 96%     Assessment & Plan:  This is most consistent with prostatitis and urinary tract infection. We'll treat with cephalexin 503 times a day culture was done he will check with his infectious disease specialist regarding medications he is on. His acute abdominal series shows signs of significant constipation. 9:56 AM- Discussed treatment plan, which includes an acute abdominal scan, a UA with micro and urine culture, and a CBC with patient, and the patient agreed to the plan.

## 2013-11-05 LAB — URINE CULTURE
Colony Count: NO GROWTH
Organism ID, Bacteria: NO GROWTH

## 2013-11-05 LAB — PSA: PSA: 4.3 ng/mL — ABNORMAL HIGH (ref <=4.00)

## 2013-11-06 ENCOUNTER — Telehealth: Payer: Self-pay

## 2013-11-06 NOTE — Telephone Encounter (Signed)
Called pt and LMOM that according to message from [place], pt is overdue for a colonoscopy. Asked pt to CB if he has had one done recently and if not, advised we would be glad to set this up for him if he will call me back.

## 2013-11-13 ENCOUNTER — Ambulatory Visit (INDEPENDENT_AMBULATORY_CARE_PROVIDER_SITE_OTHER): Payer: PRIVATE HEALTH INSURANCE | Admitting: Family Medicine

## 2013-11-13 VITALS — BP 112/76 | HR 83 | Temp 98.1°F | Resp 18 | Ht 70.0 in | Wt 166.4 lb

## 2013-11-13 DIAGNOSIS — N419 Inflammatory disease of prostate, unspecified: Secondary | ICD-10-CM

## 2013-11-13 LAB — POCT URINALYSIS DIPSTICK
Glucose, UA: NEGATIVE
Nitrite, UA: NEGATIVE
Protein, UA: NEGATIVE
Spec Grav, UA: 1.025
Urobilinogen, UA: 0.2

## 2013-11-13 LAB — PSA: PSA: 5.24 ng/mL — ABNORMAL HIGH (ref <=4.00)

## 2013-11-13 LAB — POCT CBC
Granulocyte percent: 55.4 %G (ref 37–80)
HCT, POC: 46.1 % (ref 43.5–53.7)
Hemoglobin: 14.8 g/dL (ref 14.1–18.1)
Lymph, poc: 2.2 (ref 0.6–3.4)
MCV: 98.1 fL — AB (ref 80–97)
MPV: 10.7 fL (ref 0–99.8)
POC Granulocyte: 3.4 (ref 2–6.9)
POC MID %: 8.2 %M (ref 0–12)
RBC: 4.7 M/uL (ref 4.69–6.13)

## 2013-11-13 LAB — POCT UA - MICROSCOPIC ONLY
Casts, Ur, LPF, POC: NEGATIVE
Crystals, Ur, HPF, POC: NEGATIVE
Mucus, UA: NEGATIVE
Yeast, UA: NEGATIVE

## 2013-11-13 MED ORDER — CEPHALEXIN 500 MG PO CAPS: 500.0000 mg | ORAL_CAPSULE | Freq: Four times a day (QID) | ORAL | Status: DC

## 2013-11-13 NOTE — Progress Notes (Addendum)
[facility] [address], [place] [address] [phone]  Date:  11/13/2013   Name:  Richard Velazquez   DOB:  1956/01/26   MRN:  [id number]  PCP:  No primary provider on file.    Chief Complaint: Follow-up   History of Present Illness:  Richard Velazquez is a 57 y.o. very pleasant male patient who presents with the following:  Gentleman with HIV who was here 10 days ago and diagnosed with prostatitis and a UTI.  He was treated with keflex 500 QID for about one week.   Urine culture was negative.  His abdominal pain is improved.  He no longer feels "as though I've been kicked in the gut."  He is eating well.  He does have a history of abdominal discomfort which he attributes to diverticulitis, so some degree of abdominal soreness is typical for him He does note continued polyuria- especially at night.  No dysuria.  He has not noted any blood in his urine, no foul odor.    He smoked a few cigarettes as a youth but has never been a regular smoker  Dr. Elnoria Howard is his GI doctor- he has colonoscopy regularly.  He will be due in about one year.   Patient Active Problem List   Diagnosis Date Noted  . HIV disease 11/04/2013    Past Medical History  Diagnosis Date  . HIV infection   . Diverticulitis   . Hypertension     No past surgical history on file.  History  Substance Use Topics  . Smoking status: Never Smoker   . Smokeless tobacco: Never Used  . Alcohol Use: Yes    No family history on file.  Allergies  Allergen Reactions  . Androgel [Testosterone] Anaphylaxis  . Clindamycin/Lincomycin Other (See Comments)    Tendonitis   . Levaquin [Levofloxacin In D5w] Other (See Comments)    Tendonitis    . Trazodone And Nefazodone Other (See Comments)    prioprism     Medication list has been reviewed and updated.  Current Outpatient Prescriptions on File Prior to Visit  Medication Sig Dispense Refill  . 5-Hydroxytryptophan (5-HTP PO) Take by mouth.       Marland Kitchen abacavir-lamiVUDine (EPZICOM) 600-300 MG per tablet Take 1 tablet by mouth daily.      . Ascorbic Acid (VITAMIN C) 100 MG tablet Take 100 mg by mouth daily.      Marland Kitchen atazanavir (REYATAZ) 300 MG capsule Take 300 mg by mouth daily with breakfast.      . buPROPion (WELLBUTRIN SR) 150 MG 12 hr tablet Take 150 mg by mouth 2 (two) times daily.      . diphenoxylate-atropine (LOMOTIL) 2.5-0.025 MG/5ML liquid Take 5 mLs by mouth 4 (four) times daily as needed.      Marland Kitchen emtricitabine-tenofovir (TRUVADA) 200-300 MG per tablet Take 1 tablet by mouth daily.      . fish oil-omega-3 fatty acids 1000 MG capsule Take 2 g by mouth daily.      . fluconazole (DIFLUCAN) 150 MG tablet Take 1 tablet (150 mg total) by mouth once.  1 tablet  0  . lisinopril-hydrochlorothiazide (PRINZIDE,ZESTORETIC) 10-12.5 MG per tablet Take 1 tablet by mouth daily.      Marland Kitchen LORazepam (ATIVAN) 0.5 MG tablet Take 0.5 mg by mouth every 8 (eight) hours.      . Melatonin 10 MG TABS Take by mouth.      . ritonavir (NORVIR) 100 MG capsule Take  by mouth 2 (two) times daily.      Marland Kitchen tenofovir (VIREAD) 300 MG tablet Take 300 mg by mouth daily.      . vitamin B-12 (CYANOCOBALAMIN) 50 MCG tablet Take 50 mcg by mouth daily.      . Vitamin D, Ergocalciferol, (DRISDOL) 50000 UNITS CAPS Take 50,000 Units by mouth.       No current facility-administered medications on file prior to visit.    Review of Systems:  As per HPI- otherwise negative.   Physical Examination: Filed Vitals:   11/13/13 0832  BP: 112/76  Pulse: 83  Temp: 98.1 F (36.7 C)  Resp: 18   Filed Vitals:   11/13/13 0832  Height: 5\' 10"  (1.778 m)  Weight: 166 lb 6.4 oz (75.479 kg)   Body mass index is 23.88 kg/(m^2). Ideal Body Weight: Weight in (lb) to have BMI = 25: 173.9  GEN: WDWN, NAD, Non-toxic, A & O x 3, looks well HEENT: Atraumatic, Normocephalic. Neck supple. No masses, No LAD.  Bilateral TM wnl, oropharynx normal.  PEERL,EOMI.   Ears and Nose: No external  deformity. CV: RRR, No M/G/R. No JVD. No thrill. No extra heart sounds. PULM: CTA B, no wheezes, crackles, rhonchi. No retractions. No resp. distress. No accessory muscle use. ABD: S, ND, +BS. No rebound. No HSM.  He has minimal tenderness over the lower abdomen, non specific location EXTR: No c/c/e NEURO Normal gait.  PSYCH: Normally interactive. Conversant. Not depressed or anxious appearing.  Calm demeanor.  He declines a prostate exam today Results for orders placed in visit on 11/13/13  POCT UA - MICROSCOPIC ONLY      Result Value Range   WBC, Ur, HPF, POC 1-3     RBC, urine, microscopic 2-5     Bacteria, U Microscopic trace     Mucus, UA neg     Epithelial cells, urine per micros neg     Crystals, Ur, HPF, POC neg     Casts, Ur, LPF, POC neg     Yeast, UA neg    POCT URINALYSIS DIPSTICK      Result Value Range   Color, UA yellow     Clarity, UA clear     Glucose, UA neg     Bilirubin, UA neg     Ketones, UA neg     Spec Grav, UA 1.025     Blood, UA trace-intact     pH, UA 6.0     Protein, UA neg     Urobilinogen, UA 0.2     Nitrite, UA neg     Leukocytes, UA Trace    POCT CBC      Result Value Range   WBC 6.1  4.6 - 10.2 K/uL   Lymph, poc 2.2  0.6 - 3.4   POC LYMPH PERCENT 36.4  10 - 50 %L   MID (cbc) 0.5  0 - 0.9   POC MID % 8.2  0 - 12 %M   POC Granulocyte 3.4  2 - 6.9   Granulocyte percent 55.4  37 - 80 %G   RBC 4.70  4.69 - 6.13 M/uL   Hemoglobin 14.8  14.1 - 18.1 g/dL   HCT, POC 16.1  09.6 - 53.7 %   MCV 98.1 (*) 80 - 97 fL   MCH, POC 31.5 (*) 27 - 31.2 pg   MCHC 32.1  31.8 - 35.4 g/dL   RDW, POC 04.5     Platelet Count, POC 211  142 - 424 K/uL   MPV 10.7  0 - 99.8 fL    Assessment and Plan: Prostatitis - Plan: POCT UA - Microscopic Only, POCT urinalysis dipstick, POCT CBC, PSA, cephALEXin (KEFLEX) 500 MG capsule, Urine culture  Probably persistent prostatitis.  Will treat for another week with keflex 500 QID.  Await repeat urine culture.  Discussed  doing a CT scan to ensure there is no other pathology such as diverticulitis or appendicitis.  However Kowen feels that he is getting quite a bit better and declines a CT today.  Close follow-up if he is getting worse in any way.  Also noted that his PSA was up at his last visit.  He understands that it may likely still be up due to recent prostatitis.  However he would like to go ahead and repeat this as long as he is here.  He also has microhematuria today.  Await his urine culture.  This may be due to prostatitis as well  Signed Abbe Amsterdam, MD  12/26: called and discussed with pt.  His urine culture is negative.  PSA is up a bit more, but this is not surprising given his recent prostate infection.  He will come in for a recheck PSA in about 4 weeks.  Will place a future order for him 1/4: also placed a future order for UA and micro.  Called and LMOM to let him know that he will need to give a urine as well

## 2013-11-13 NOTE — Patient Instructions (Signed)
I will be in touch with the rest of your labs.  Go back on Keflex- let me know if you start getting worse, if you have fever, worsening abdominal pain or if you cannot eat.

## 2013-11-14 LAB — URINE CULTURE
Colony Count: NO GROWTH
Organism ID, Bacteria: NO GROWTH

## 2013-11-16 NOTE — Addendum Note (Signed)
Addended by: Abbe Amsterdam C on: 11/16/2013 02:59 PM   Modules accepted: Orders

## 2013-11-25 NOTE — Addendum Note (Signed)
Addended by: Abbe AmsterdamOPLAND, Kalah Pflum C on: 11/25/2013 04:03 PM   Modules accepted: Orders

## 2013-12-30 ENCOUNTER — Encounter: Payer: Self-pay | Admitting: Emergency Medicine

## 2013-12-30 ENCOUNTER — Ambulatory Visit (INDEPENDENT_AMBULATORY_CARE_PROVIDER_SITE_OTHER): Payer: Medicare HMO | Admitting: Emergency Medicine

## 2013-12-30 VITALS — BP 106/76 | HR 84 | Temp 98.3°F | Resp 18 | Ht 70.0 in | Wt 165.6 lb

## 2013-12-30 DIAGNOSIS — N419 Inflammatory disease of prostate, unspecified: Secondary | ICD-10-CM

## 2013-12-30 DIAGNOSIS — R8290 Unspecified abnormal findings in urine: Secondary | ICD-10-CM

## 2013-12-30 DIAGNOSIS — R1032 Left lower quadrant pain: Secondary | ICD-10-CM

## 2013-12-30 DIAGNOSIS — R82998 Other abnormal findings in urine: Secondary | ICD-10-CM

## 2013-12-30 LAB — IFOBT (OCCULT BLOOD): IFOBT: NEGATIVE

## 2013-12-30 LAB — POCT URINALYSIS DIPSTICK
BILIRUBIN UA: NEGATIVE
GLUCOSE UA: NEGATIVE
KETONES UA: NEGATIVE
LEUKOCYTES UA: NEGATIVE
Nitrite, UA: NEGATIVE
PH UA: 7
Protein, UA: NEGATIVE
SPEC GRAV UA: 1.02
Urobilinogen, UA: 0.2

## 2013-12-30 LAB — POCT UA - MICROSCOPIC ONLY
CRYSTALS, UR, HPF, POC: NEGATIVE
Casts, Ur, LPF, POC: NEGATIVE
Epithelial cells, urine per micros: NEGATIVE
Mucus, UA: NEGATIVE
Yeast, UA: NEGATIVE

## 2013-12-30 LAB — PSA: PSA: 2.96 ng/mL (ref <=4.00)

## 2013-12-30 NOTE — Progress Notes (Addendum)
Subjective:    Patient ID: Richard Velazquez, male    DOB: Jul 13, 1956, 58 y.o.   MRN: [id number]  HPI This chart was scribed for Viviann Spare Lizet Kelso-MD by Ladona Ridgel Day, Scribe. This patient was seen in room 14 and the patient's care was started at 9:38 AM.  HPI Comments: Richard Velazquez is a 58 y.o. male who presents to the [facility] for follow up of hematuria. He had seen another provider for this problem a few weeks ago and was tx w/doxycycline which he has since completed. He was also seen here 12/14 and dx w/prostatitis.  He states that hematuria still seems to be a problem for him. He has not yet seen a urologist.  He denies family hx of prostate CA. He states his father had difficulty w/his prostate in terms of needing a catheter to void his urine. 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 He denies hx of kidney stones. He had an abdominal x-ray in his last visit which showed some stool but possible constipation.   He has never been a smoker.  Patient Active Problem List   Diagnosis Date Noted  . HIV disease 11/04/2013   History reviewed. No pertinent past surgical history.  History reviewed. No pertinent family history.  History   Social History  . Marital Status: Single    Spouse Name: N/A    Number of Children: N/A  . Years of Education: N/A   Occupational History  . Not on file.   Social History Main Topics  . Smoking status: Never Smoker   . Smokeless tobacco: Never Used  . Alcohol Use: Yes  . Drug Use: No  . Sexual Activity: Yes   Other Topics Concern  . Not on file   Social History Narrative  . No narrative on file   Allergies  Allergen Reactions  . Androgel [Testosterone] Anaphylaxis  . Clindamycin/Lincomycin Other (See Comments)    Tendonitis   . Levaquin [Levofloxacin In D5w] Other (See Comments)    Tendonitis    . Trazodone And Nefazodone Other (See Comments)    prioprism    Results for orders placed in visit on 12/30/13  POCT URINALYSIS DIPSTICK      Result Value Range   Color, UA yellow     Clarity, UA clear     Glucose, UA neg     Bilirubin, UA neg     Ketones, UA neg     Spec Grav, UA 1.020     Blood, UA trace-lysed     pH, UA 7.0     Protein, UA neg     Urobilinogen, UA 0.2     Nitrite, UA neg     Leukocytes, UA Negative    POCT UA - MICROSCOPIC ONLY      Result Value Range   WBC, Ur, HPF, POC 0-1     RBC, urine, microscopic 1-4     Bacteria, U Microscopic trace     Mucus, UA neg     Epithelial cells, urine per micros neg     Crystals, Ur, HPF, POC neg     Casts, Ur, LPF, POC neg     Yeast, UA neg    IFOBT (OCCULT BLOOD)      Result Value Range   IFOBT Negative     Review of Systems  Constitutional: Negative for fever and chills.  Respiratory: Negative for cough and shortness of  breath.   Cardiovascular: Negative for chest pain.  Gastrointestinal: Negative for abdominal pain.  Genitourinary: Positive for hematuria. Negative for flank pain.  Musculoskeletal: Negative for back pain.      Objective:   Physical Exam Nursing note and vitals reviewed. Constitutional: Patient is oriented to person, place, and time. Patient appears well-developed and well-nourished. No distress.  HENT:  Head: Normocephalic and atraumatic.  Neck: Neck supple. No tracheal deviation present.  Cardiovascular: Normal rate, regular rhythm and normal heart sounds.   No murmur heard. Pulmonary/Chest: Effort normal and breath sounds normal. No respiratory distress. Patient has no wheezes. Patient has no rales.  Musculoskeletal: Normal range of motion.  Neurological: Patient is alert and oriented to person, place, and time.  Skin: Skin is warm and dry.  Psychiatric: Patient has a normal mood and affect. Patient's behavior is normal.  There is mild tenderness at the right inguinal canal. There is an enlarged prostate which is non-tender without  nodularity. Triage Vitals: BP 106/76  Pulse 84  Temp(Src) 98.3 F (36.8 C) (Oral)  Resp 18  Ht 5\' 10"  (1.778 m)  Wt 165 lb 9.6 oz (75.116 kg)  BMI 23.76 kg/m2  SpO2 99% Results for orders placed in visit on 12/30/13  POCT URINALYSIS DIPSTICK      Result Value Range   Color, UA yellow     Clarity, UA clear     Glucose, UA neg     Bilirubin, UA neg     Ketones, UA neg     Spec Grav, UA 1.020     Blood, UA trace-lysed     pH, UA 7.0     Protein, UA neg     Urobilinogen, UA 0.2     Nitrite, UA neg     Leukocytes, UA Negative    POCT UA - MICROSCOPIC ONLY      Result Value Range   WBC, Ur, HPF, POC 0-1     RBC, urine, microscopic 1-4     Bacteria, U Microscopic trace     Mucus, UA neg     Epithelial cells, urine per micros neg     Crystals, Ur, HPF, POC neg     Casts, Ur, LPF, POC neg     Yeast, UA neg    IFOBT (OCCULT BLOOD)      Result Value Range   IFOBT Negative     DIAGNOSTIC STUDIES: Oxygen Saturation is 99% on room air, normal by my interpretation.    COORDINATION OF CARE: At 920 AM Discussed treatment plan with patient which includes blood work. Patient agrees.      Assessment & Plan:  Patient needs evaluation for ongoing left-sided abdominal pain and symptoms of prostatitis. Referral made to urology for their evaluation referral also made to Dr. Elnoria HowardHung in that patient is due for an update on his colonoscopy. We'll hold off on imaging at the present time.

## 2014-03-01 DIAGNOSIS — R413 Other amnesia: Secondary | ICD-10-CM | POA: Insufficient documentation

## 2014-05-14 ENCOUNTER — Ambulatory Visit (INDEPENDENT_AMBULATORY_CARE_PROVIDER_SITE_OTHER): Payer: Medicare HMO | Admitting: Family Medicine

## 2014-05-14 VITALS — BP 102/68 | HR 76 | Temp 97.6°F | Resp 18 | Wt 165.0 lb

## 2014-05-14 DIAGNOSIS — H60391 Other infective otitis externa, right ear: Secondary | ICD-10-CM

## 2014-05-14 DIAGNOSIS — H6120 Impacted cerumen, unspecified ear: Secondary | ICD-10-CM

## 2014-05-14 DIAGNOSIS — H6121 Impacted cerumen, right ear: Secondary | ICD-10-CM

## 2014-05-14 DIAGNOSIS — H60399 Other infective otitis externa, unspecified ear: Secondary | ICD-10-CM

## 2014-05-14 MED ORDER — NEOMYCIN-POLYMYXIN-HC 3.5-10000-1 OT SOLN: 4.0000 [drp] | Freq: Four times a day (QID) | OTIC | Status: DC

## 2014-05-14 NOTE — Patient Instructions (Addendum)
In order to keep from getting these blockages I would recommend that you put it on your calendar and every 3 months or so use the Debrox drops. Irrigated out as directed.  Return as needed  If the ear develops pain go ahead and use the ear drops as directed

## 2014-05-14 NOTE — Progress Notes (Signed)
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  He has loss of hearing in the left ear, and his right ear has a tendency to build up wax. He has had to have it irrigated several times in the past. It is now plugged up completely. He has a history of itchy ears.  Objective: Right ear is plugged with cerumen. The left ear has some headache, but not like the right. He is a little harder..  Assessment: Cerumen impaction right ear Deafness left ear  Plan: Irrigates cerumen  Post irrigation the ears were rechecked. They're clean, but there is erythema of the superior half of the ear canal on the right. This is probably just from inflammation and irritation from the wax and irrigation process. However I talked with him about needing to go on antibiotic ear drops if the ear becomes painful. Will give her a printed prescription for him to have on as-needed basis in the next few days.  Assessment: Cerumen impaction, resolved Deafness left ear Erythema right ear canal/otitis externa possibly  Plan: Cortisporin Otic drops for when necessary use. See instructions for taking care of his ears.

## 2014-09-19 DIAGNOSIS — M19049 Primary osteoarthritis, unspecified hand: Secondary | ICD-10-CM | POA: Insufficient documentation

## 2014-09-19 DIAGNOSIS — I10 Essential (primary) hypertension: Secondary | ICD-10-CM | POA: Insufficient documentation

## 2014-09-21 ENCOUNTER — Telehealth: Payer: Self-pay

## 2014-09-21 DIAGNOSIS — Z1211 Encounter for screening for malignant neoplasm of colon: Secondary | ICD-10-CM

## 2014-09-21 NOTE — Telephone Encounter (Signed)
Per dr. Cleta Albertsaub, call pt and see if he is UTD on a colon screening. If not, make an appt with GI if he is willing.

## 2014-09-22 NOTE — Telephone Encounter (Signed)
Left message on machine to call back  

## 2014-09-30 NOTE — Telephone Encounter (Signed)
Left message on machine to call back  

## 2014-11-26 ENCOUNTER — Encounter: Payer: Self-pay | Admitting: Family Medicine

## 2015-04-03 IMAGING — CR DG ABDOMEN ACUTE W/ 1V CHEST
5 series · 5 of 5 positions shown · non-contrast
Comparison: None.

CLINICAL DATA: Abdominal pain

EXAM:
ACUTE ABDOMEN SERIES (ABDOMEN 2 VIEW & CHEST 1 VIEW)

[PA]
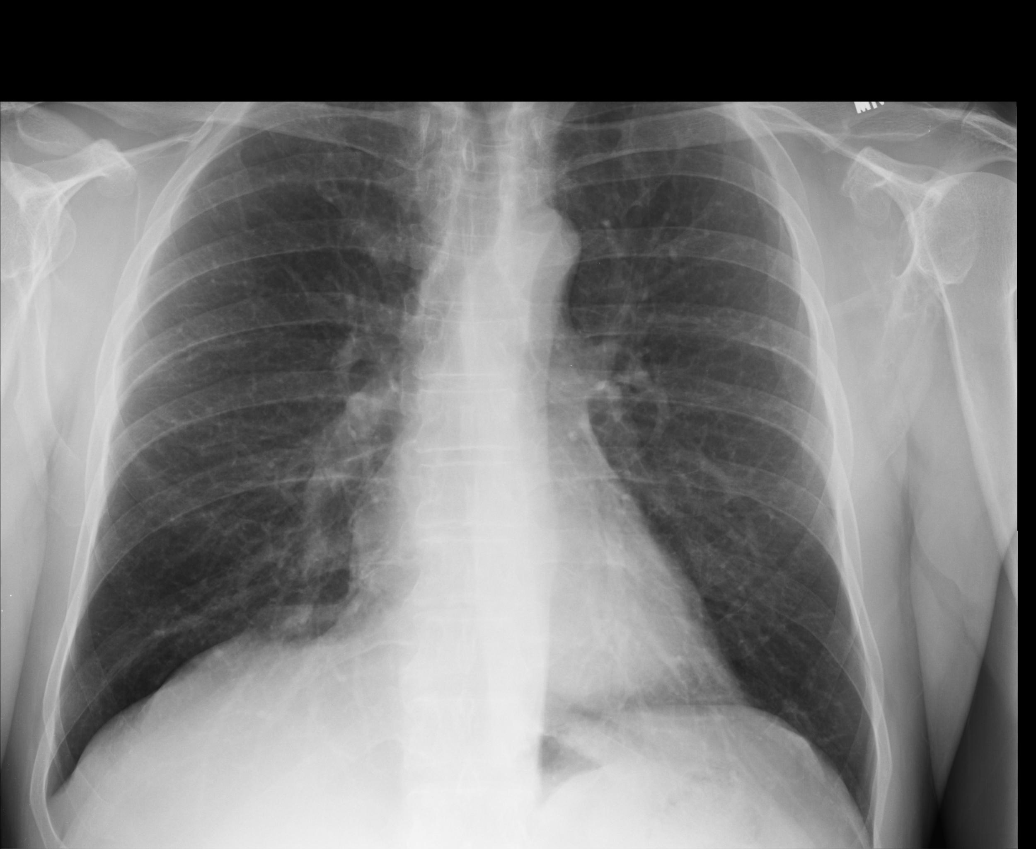

[AP (1 of 4)]
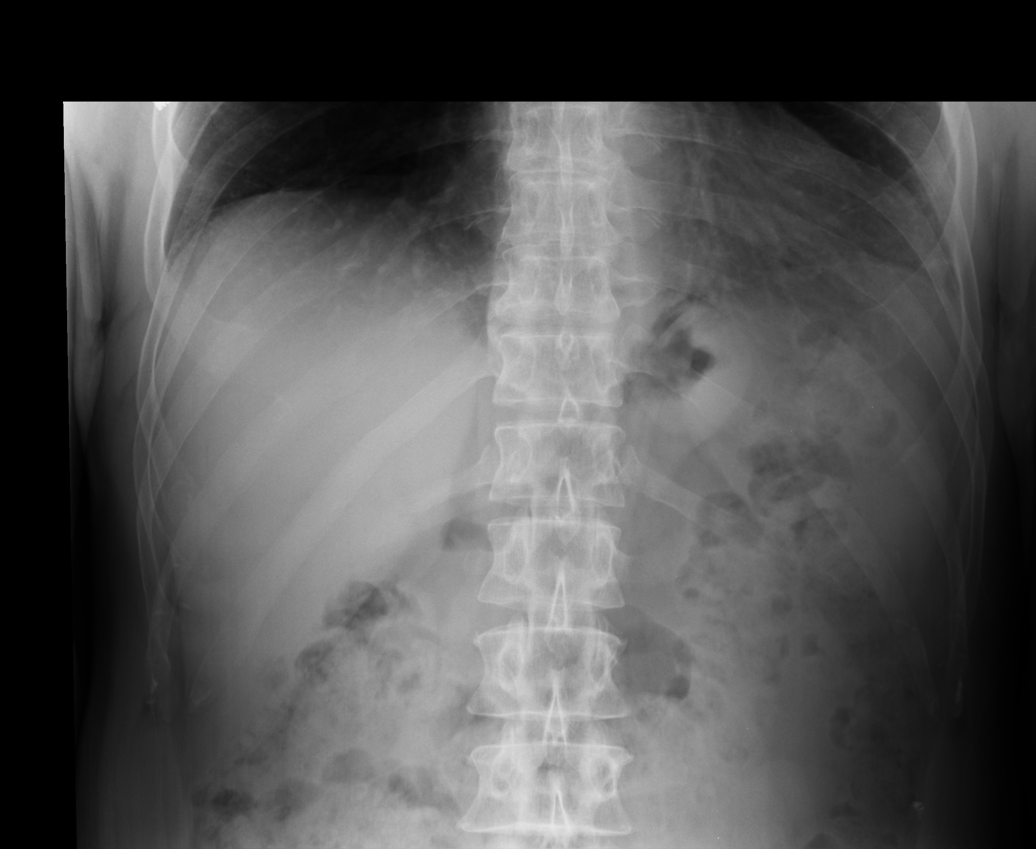

[AP (2 of 4)]
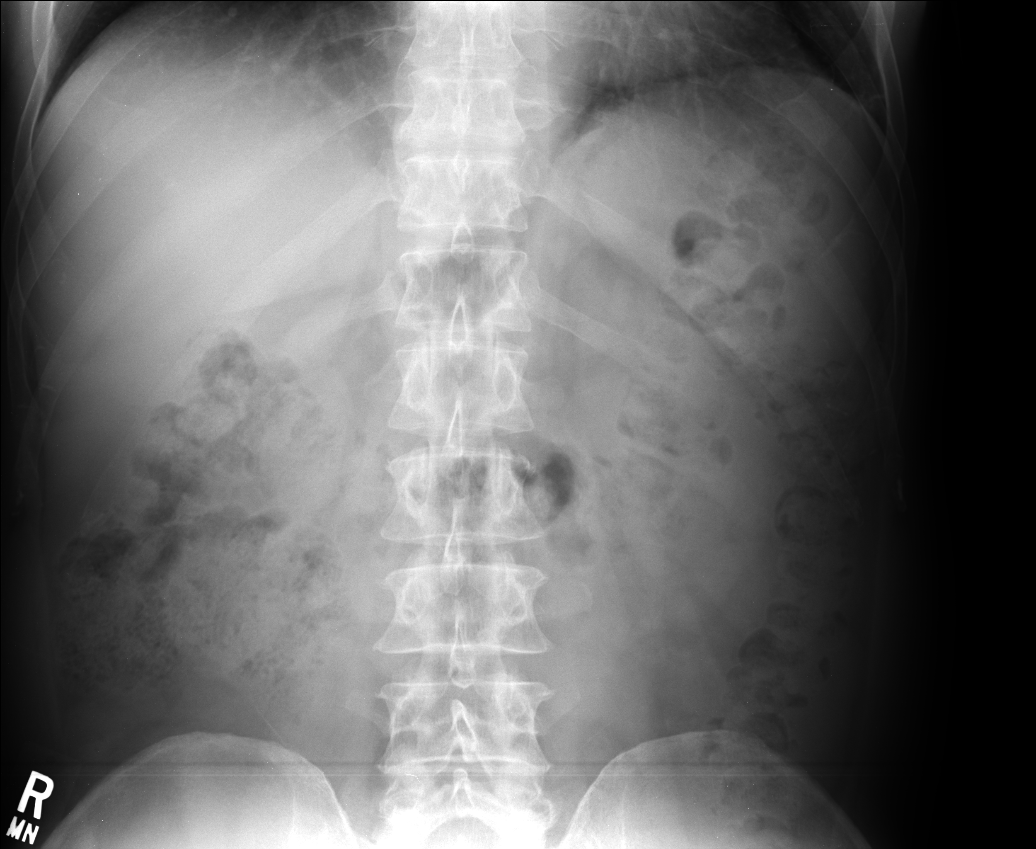

[AP (3 of 4)]
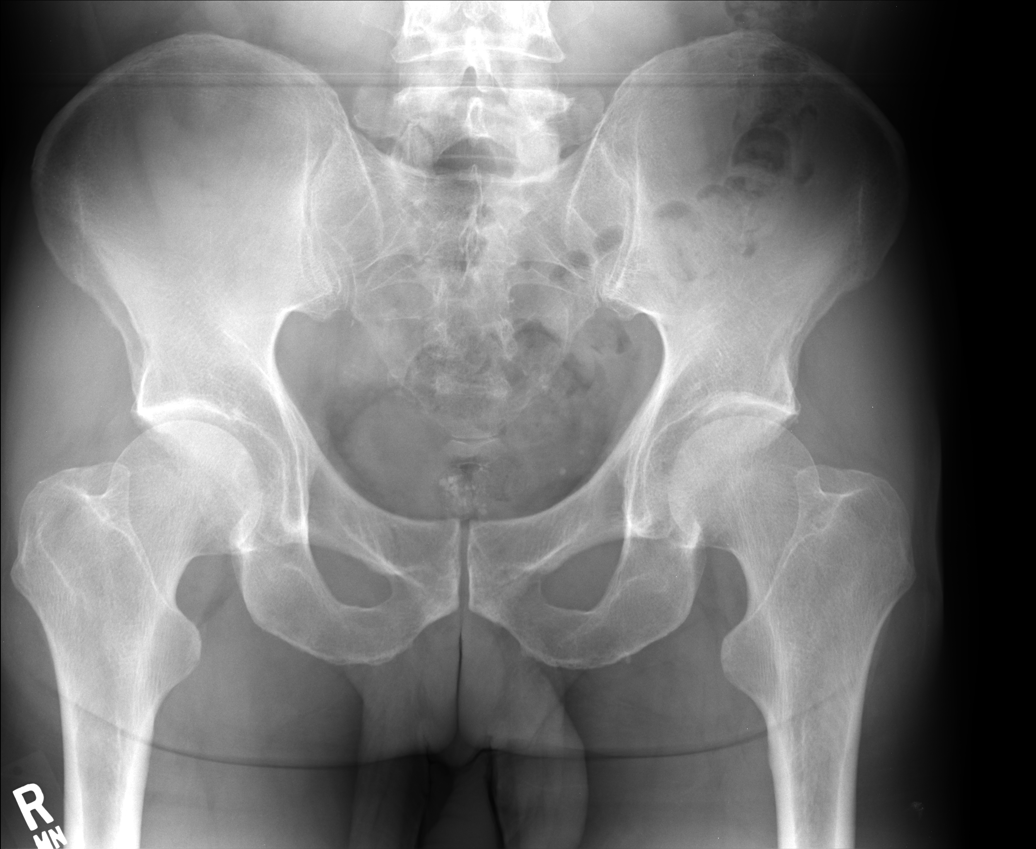

[AP (4 of 4)]
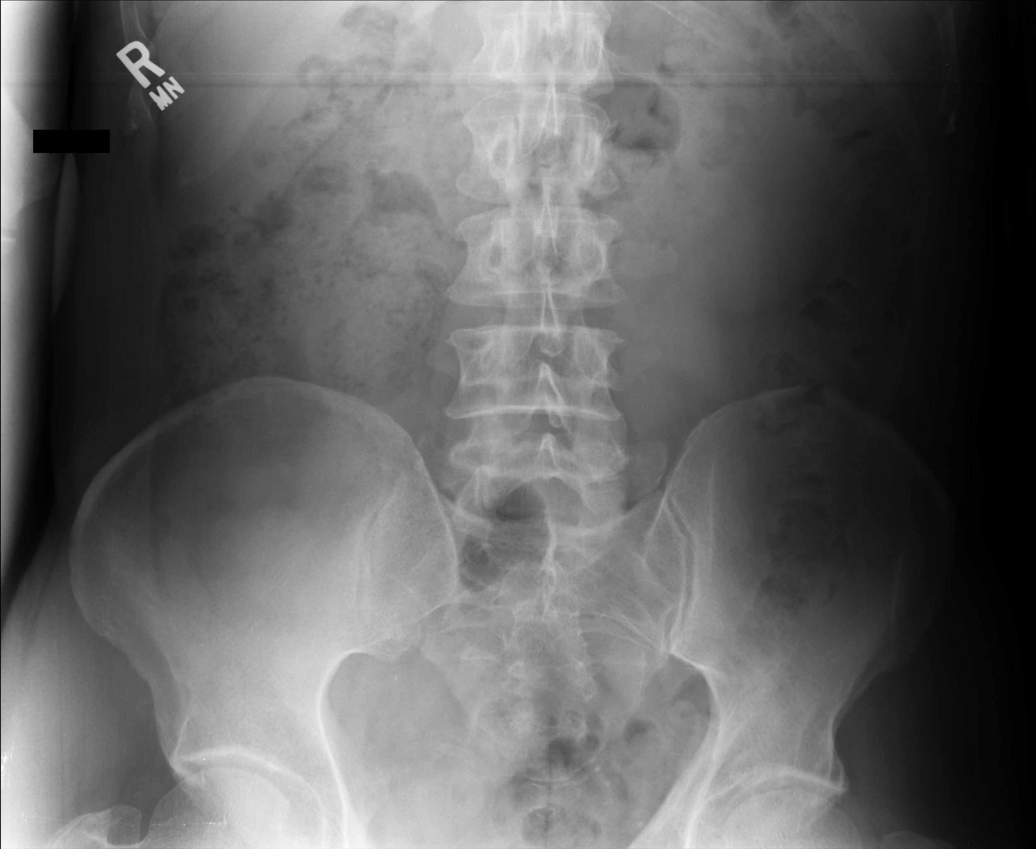

[5 of 5 positions shown; findings below may reference images not displayed]

FINDINGS: Lungs are clear. No pleural effusion or pneumothorax.

The heart is normal in size.

Nonobstructive bowel gas pattern. Mild to moderate colonic stool
burden.

No evidence of free air under the diaphragm on the upright view.

Mild degenerative changes at L4-5.
IMPRESSION: No evidence of acute cardiopulmonary disease.

No evidence of small bowel obstruction or free air.

Mild to moderate colonic stool burden, raising the possibility of
constipation.

## 2015-05-08 DIAGNOSIS — F419 Anxiety disorder, unspecified: Secondary | ICD-10-CM | POA: Insufficient documentation

## 2015-06-24 ENCOUNTER — Ambulatory Visit (INDEPENDENT_AMBULATORY_CARE_PROVIDER_SITE_OTHER): Payer: Medicare HMO | Admitting: Family Medicine

## 2015-06-24 VITALS — BP 118/82 | HR 79 | Temp 98.1°F | Resp 18 | Ht 70.75 in | Wt 167.4 lb

## 2015-06-24 DIAGNOSIS — B20 Human immunodeficiency virus [HIV] disease: Secondary | ICD-10-CM | POA: Diagnosis not present

## 2015-06-24 DIAGNOSIS — L309 Dermatitis, unspecified: Secondary | ICD-10-CM

## 2015-06-24 DIAGNOSIS — J029 Acute pharyngitis, unspecified: Secondary | ICD-10-CM | POA: Diagnosis not present

## 2015-06-24 LAB — POCT CBC
GRANULOCYTE PERCENT: 55 % (ref 37–80)
HEMATOCRIT: 45.1 % (ref 43.5–53.7)
Hemoglobin: 15.4 g/dL (ref 14.1–18.1)
Lymph, poc: 2.2 (ref 0.6–3.4)
MCH: 31 pg (ref 27–31.2)
MCHC: 34.1 g/dL (ref 31.8–35.4)
MCV: 90.8 fL (ref 80–97)
MID (CBC): 0.5 (ref 0–0.9)
MPV: 7.8 fL (ref 0–99.8)
POC Granulocyte: 3.4 (ref 2–6.9)
POC LYMPH PERCENT: 36.2 %L (ref 10–50)
POC MID %: 8.8 %M (ref 0–12)
Platelet Count, POC: 182 10*3/uL (ref 142–424)
RBC: 4.97 M/uL (ref 4.69–6.13)
RDW, POC: 13.1 %
WBC: 6.2 10*3/uL (ref 4.6–10.2)

## 2015-06-24 MED ORDER — TRIAMCINOLONE ACETONIDE 0.1 % EX CREA: 1.0000 "application " | TOPICAL_CREAM | Freq: Two times a day (BID) | CUTANEOUS | Status: DC

## 2015-06-24 NOTE — Patient Instructions (Signed)
Your labs look good so far.  I will be in touch with your throat culture but I think you likely have a common virus of some type Do pick up a thermometer so you can keep an eye on your temp- let us know if you have a fever over 101 Try the triamcinolone for the rash on the back of your neck- let me know if not helpful

## 2015-06-24 NOTE — Progress Notes (Addendum)
[facility] [address], [place] [address] [phone]  Date:  06/24/2015   Name:  Richard Velazquez   DOB:  [birth year]   MRN:  [id number]  PCP:  No primary care provider on file.    Chief Complaint: Excessive Sweating; Fatigue; Sore Throat; and Rash   History of Present Illness:  Richard Velazquez is a 59 y.o. very pleasant male patient who presents with the following:  Here today with illness- he states that he is "not feeling wonderful but not feeling that terrible either"  Today is Tuesday.  On Saturday he noted a feeling of weakness, fatigue, sweating, maybe a little leg cramps and mild ST.   Perhaps mild cough, he thinks this may be able to get worse.  No GI symptoms.  He has some stuffy nose He has not been able to check his temperature at home, but has not felt that he had a fever Little bit of body aches He does not think he got dehydrated in the heat- he has not been outdoors much   He also notes a patch of itchy skin on his neck that has been breaking out of the last few months. He has tried moisturizer and otc cortisone but it keeps coming back   He has not been SA for about 6 months, but did give oral sex about 10 days ago; this got him worried that he could have gonorrhea of the throat so he came in to be seen  His HIV care is through [facility]- states that his counts have been good.  Last labs 2 months ago, he is checked every 4 months   Patient Active Problem List   Diagnosis Date Noted  . HIV disease 11/04/2013    Past Medical History  Diagnosis Date  . HIV infection   . Diverticulitis   . Hypertension     History reviewed. No pertinent past surgical history.  History  Substance Use Topics  . Smoking status: Never Smoker   . Smokeless tobacco: Never Used  . Alcohol Use: Yes    History reviewed. No pertinent family history.  Allergies  Allergen Reactions  . Androgel [Testosterone] Anaphylaxis  . Clindamycin/Lincomycin  Other (See Comments)    Tendonitis   . Levaquin [Levofloxacin In D5w] Other (See Comments)    Tendonitis    . Trazodone And Nefazodone Other (See Comments)    prioprism     Medication list has been reviewed and updated.  Current Outpatient Prescriptions on File Prior to Visit  Medication Sig Dispense Refill  . 5-Hydroxytryptophan (5-HTP PO) Take by mouth.    . Ascorbic Acid (VITAMIN C) 100 MG tablet Take 500 mg by mouth daily.     Marland Kitchen atazanavir (REYATAZ) 300 MG capsule Take 300 mg by mouth daily with breakfast.    . buPROPion (WELLBUTRIN SR) 150 MG 12 hr tablet Take 150 mg by mouth 2 (two) times daily.    . diphenoxylate-atropine (LOMOTIL) 2.5-0.025 MG/5ML liquid Take 5 mLs by mouth 4 (four) times daily as needed.    . fish oil-omega-3 fatty acids 1000 MG capsule Take 2 g by mouth daily.    Marland Kitchen lamivudine (EPIVIR) 300 MG tablet Take 300 mg by mouth daily.    Marland Kitchen lisinopril-hydrochlorothiazide (PRINZIDE,ZESTORETIC) 10-12.5 MG per tablet Take 1 tablet by mouth daily.    Marland Kitchen LORazepam (ATIVAN) 0.5 MG tablet Take 0.5 mg by mouth every 8 (eight) hours.    . Melatonin 10 MG TABS  Take 5 mg by mouth.     . ritonavir (NORVIR) 100 MG capsule Take by mouth 2 (two) times daily.    Marland Kitchen tenofovir (VIREAD) 300 MG tablet Take 300 mg by mouth daily.    . Vitamin D, Ergocalciferol, (DRISDOL) 50000 UNITS CAPS Take 50,000 Units by mouth.    Marland Kitchen abacavir-lamiVUDine (EPZICOM) 600-300 MG per tablet Take 1 tablet by mouth daily.    . cephALEXin (KEFLEX) 500 MG capsule Take 1 capsule (500 mg total) by mouth 4 (four) times daily. (Patient not taking: Reported on 06/24/2015) 30 capsule 0  . emtricitabine-tenofovir (TRUVADA) 200-300 MG per tablet Take 1 tablet by mouth daily.    . fluconazole (DIFLUCAN) 150 MG tablet Take 1 tablet (150 mg total) by mouth once. (Patient not taking: Reported on 06/24/2015) 1 tablet 0  . neomycin-polymyxin-hydrocortisone (CORTISPORIN) otic solution Place 4 drops into the right ear 4 (four) times  daily. (Patient not taking: Reported on 06/24/2015) 10 mL 0   No current facility-administered medications on file prior to visit.    Review of Systems:  As per HPI- otherwise negative.   Physical Examination: Filed Vitals:   06/24/15 0813  BP: 118/82  Pulse: 79  Temp: 98.1 F (36.7 C)  Resp: 18   Filed Vitals:   06/24/15 0813  Height: 5' 10.75" (1.797 m)  Weight: 167 lb 6.4 oz (75.932 kg)   Body mass index is 23.51 kg/(m^2). Ideal Body Weight: Weight in (lb) to have BMI = 25: 177.6  GEN: WDWN, NAD, Non-toxic, A & O x 3, looks well HEENT: Atraumatic, Normocephalic. Neck supple. No masses, No LAD.  Bilateral TM wnl, oropharynx normal.  PEERL,EOMI.   Ears and Nose: No external deformity. CV: RRR, No M/G/R. No JVD. No thrill. No extra heart sounds. PULM: CTA B, no wheezes, crackles, rhonchi. No retractions. No resp. distress. No accessory muscle use. ABD: S, NT, ND. No rebound. No HSM. EXTR: No c/c/e NEURO Normal gait.  PSYCH: Normally interactive. Conversant. Not depressed or anxious appearing.  Calm demeanor.  Minor patch of dry and excoriated skin on the back of his neck c/w eczema  Results for orders placed or performed in visit on 06/24/15  POCT CBC  Result Value Ref Range   WBC 6.2 4.6 - 10.2 K/uL   Lymph, poc 2.2 0.6 - 3.4   POC LYMPH PERCENT 36.2 10 - 50 %L   MID (cbc) 0.5 0 - 0.9   POC MID % 8.8 0 - 12 %M   POC Granulocyte 3.4 2 - 6.9   Granulocyte percent 55.0 37 - 80 %G   RBC 4.97 4.69 - 6.13 M/uL   Hemoglobin 15.4 14.1 - 18.1 g/dL   HCT, POC 16.1 09.6 - 53.7 %   MCV 90.8 80 - 97 fL   MCH, POC 31.0 27 - 31.2 pg   MCHC 34.1 31.8 - 35.4 g/dL   RDW, POC 04.5 %   Platelet Count, POC 182 142 - 424 K/uL   MPV 7.8 0 - 99.8 fL    Assessment and Plan: Sore throat - Plan: POCT CBC, Gonococcus culture  HIV disease  Eczema - Plan: triamcinolone cream (KENALOG) 0.1 %  Concern about gonococcus of the throat.  This seems unlikely with his exam but will check  culture and be in touch with him Try triamcinolone pulsed therapy for his neck Will plan further follow- up pending labs.   Signed Abbe Amsterdam, MD  Called 8/4 with throat culture- negative for gonorrhea.  LMOM that labs look fine

## 2015-06-26 LAB — GONOCOCCUS CULTURE

## 2015-09-10 DIAGNOSIS — R69 Illness, unspecified: Secondary | ICD-10-CM | POA: Diagnosis not present

## 2015-09-16 ENCOUNTER — Telehealth: Payer: Self-pay | Admitting: Physician Assistant

## 2015-09-16 NOTE — Telephone Encounter (Signed)
Got a call from [employer] - needing information regarding the patient's health record and whether he was well enough to travel prior to 01/2015 and then need medical records.  They are faxing a release form with the information they need.

## 2015-09-16 NOTE — Telephone Encounter (Signed)
Spoke with Harrold DonathNathan at [employer] - gave general information regarding his 06/2015 visit.  Told them he had no history known to us regarding back pain.

## 2015-09-18 DIAGNOSIS — R69 Illness, unspecified: Secondary | ICD-10-CM | POA: Diagnosis not present

## 2015-09-26 DIAGNOSIS — M5441 Lumbago with sciatica, right side: Secondary | ICD-10-CM | POA: Diagnosis not present

## 2015-09-26 DIAGNOSIS — M545 Low back pain: Secondary | ICD-10-CM | POA: Diagnosis not present

## 2015-09-26 DIAGNOSIS — M5442 Lumbago with sciatica, left side: Secondary | ICD-10-CM | POA: Diagnosis not present

## 2015-09-29 DIAGNOSIS — N401 Enlarged prostate with lower urinary tract symptoms: Secondary | ICD-10-CM | POA: Diagnosis not present

## 2015-10-01 DIAGNOSIS — M545 Low back pain: Secondary | ICD-10-CM | POA: Diagnosis not present

## 2015-10-03 DIAGNOSIS — M545 Low back pain: Secondary | ICD-10-CM | POA: Diagnosis not present

## 2015-10-06 DIAGNOSIS — N401 Enlarged prostate with lower urinary tract symptoms: Secondary | ICD-10-CM | POA: Diagnosis not present

## 2015-10-06 DIAGNOSIS — R972 Elevated prostate specific antigen [PSA]: Secondary | ICD-10-CM | POA: Diagnosis not present

## 2015-10-06 DIAGNOSIS — R351 Nocturia: Secondary | ICD-10-CM | POA: Diagnosis not present

## 2015-10-06 DIAGNOSIS — R3129 Other microscopic hematuria: Secondary | ICD-10-CM | POA: Diagnosis not present

## 2015-10-06 DIAGNOSIS — M545 Low back pain: Secondary | ICD-10-CM | POA: Diagnosis not present

## 2015-10-08 DIAGNOSIS — M545 Low back pain: Secondary | ICD-10-CM | POA: Diagnosis not present

## 2015-10-13 DIAGNOSIS — M545 Low back pain: Secondary | ICD-10-CM | POA: Diagnosis not present

## 2015-10-15 DIAGNOSIS — M545 Low back pain: Secondary | ICD-10-CM | POA: Diagnosis not present

## 2015-10-20 DIAGNOSIS — M5441 Lumbago with sciatica, right side: Secondary | ICD-10-CM | POA: Diagnosis not present

## 2015-10-22 DIAGNOSIS — H2513 Age-related nuclear cataract, bilateral: Secondary | ICD-10-CM | POA: Diagnosis not present

## 2015-10-22 DIAGNOSIS — H43811 Vitreous degeneration, right eye: Secondary | ICD-10-CM | POA: Diagnosis not present

## 2015-10-22 DIAGNOSIS — H01022 Squamous blepharitis right lower eyelid: Secondary | ICD-10-CM | POA: Diagnosis not present

## 2015-10-22 DIAGNOSIS — H01021 Squamous blepharitis right upper eyelid: Secondary | ICD-10-CM | POA: Diagnosis not present

## 2015-10-22 DIAGNOSIS — H01025 Squamous blepharitis left lower eyelid: Secondary | ICD-10-CM | POA: Diagnosis not present

## 2015-10-22 DIAGNOSIS — H01024 Squamous blepharitis left upper eyelid: Secondary | ICD-10-CM | POA: Diagnosis not present

## 2015-10-23 DIAGNOSIS — M545 Low back pain: Secondary | ICD-10-CM | POA: Diagnosis not present

## 2015-10-27 DIAGNOSIS — M545 Low back pain: Secondary | ICD-10-CM | POA: Diagnosis not present

## 2015-10-29 DIAGNOSIS — M545 Low back pain: Secondary | ICD-10-CM | POA: Diagnosis not present

## 2015-10-31 DIAGNOSIS — M545 Low back pain: Secondary | ICD-10-CM | POA: Diagnosis not present

## 2015-11-03 DIAGNOSIS — M545 Low back pain: Secondary | ICD-10-CM | POA: Diagnosis not present

## 2015-11-05 DIAGNOSIS — M545 Low back pain: Secondary | ICD-10-CM | POA: Diagnosis not present

## 2015-11-10 DIAGNOSIS — M545 Low back pain: Secondary | ICD-10-CM | POA: Diagnosis not present

## 2015-11-12 DIAGNOSIS — M545 Low back pain: Secondary | ICD-10-CM | POA: Diagnosis not present

## 2015-11-19 DIAGNOSIS — M545 Low back pain: Secondary | ICD-10-CM | POA: Diagnosis not present

## 2015-11-21 DIAGNOSIS — M545 Low back pain: Secondary | ICD-10-CM | POA: Diagnosis not present

## 2015-11-26 DIAGNOSIS — M545 Low back pain: Secondary | ICD-10-CM | POA: Diagnosis not present

## 2015-11-28 DIAGNOSIS — M5442 Lumbago with sciatica, left side: Secondary | ICD-10-CM | POA: Diagnosis not present

## 2015-12-01 DIAGNOSIS — M545 Low back pain: Secondary | ICD-10-CM | POA: Diagnosis not present

## 2015-12-03 DIAGNOSIS — M545 Low back pain: Secondary | ICD-10-CM | POA: Diagnosis not present

## 2015-12-09 DIAGNOSIS — M545 Low back pain: Secondary | ICD-10-CM | POA: Diagnosis not present

## 2015-12-11 DIAGNOSIS — M545 Low back pain: Secondary | ICD-10-CM | POA: Diagnosis not present

## 2015-12-15 DIAGNOSIS — M545 Low back pain: Secondary | ICD-10-CM | POA: Diagnosis not present

## 2015-12-17 DIAGNOSIS — M545 Low back pain: Secondary | ICD-10-CM | POA: Diagnosis not present

## 2015-12-22 DIAGNOSIS — M79641 Pain in right hand: Secondary | ICD-10-CM | POA: Diagnosis not present

## 2015-12-22 DIAGNOSIS — M545 Low back pain: Secondary | ICD-10-CM | POA: Diagnosis not present

## 2015-12-22 DIAGNOSIS — M79642 Pain in left hand: Secondary | ICD-10-CM | POA: Diagnosis not present

## 2015-12-23 ENCOUNTER — Ambulatory Visit (INDEPENDENT_AMBULATORY_CARE_PROVIDER_SITE_OTHER): Payer: Medicare HMO | Admitting: Family Medicine

## 2015-12-23 VITALS — BP 128/80 | HR 79 | Temp 97.7°F | Resp 16 | Ht 70.0 in | Wt 172.0 lb

## 2015-12-23 DIAGNOSIS — L290 Pruritus ani: Secondary | ICD-10-CM | POA: Diagnosis not present

## 2015-12-23 MED ORDER — NYSTATIN-TRIAMCINOLONE 100000-0.1 UNIT/GM-% EX CREA: 1.0000 "application " | TOPICAL_CREAM | Freq: Two times a day (BID) | CUTANEOUS | Status: DC

## 2015-12-23 NOTE — Progress Notes (Signed)
   Subjective:    Patient ID: Richard Velazquez, male    DOB: [birth year], 60 y.o.   MRN: [id number]  HPI This is a pleasant 60 yo male who presents today with several month history of anal itching. Symptoms worse at night and with exercising using his rowing machine. Area feels sore and he occasionally has small amount of blood on tissue with wiping. He has tried OTC preparations without relief (hemorrhoid cream and wipes). Has not had diarrhea or constipation.   He is seen regularly at [facility] for treatment of HIV.   He was recently started on meloxicam for bilateral hand pain by Dr. Yisroel Ramming. He took one dose yesterday and reports feeling terrible today- stomach upset, achy.   Review of Systems  Constitutional: Positive for appetite change (decreased wtih meloxicam) and fatigue (chronic). Negative for fever and chills.  HENT: Negative for congestion and sneezing.   Respiratory: Negative for chest tightness and shortness of breath.   Cardiovascular: Negative for chest pain.  Gastrointestinal: Positive for nausea (since starting meloxicam). Negative for vomiting, diarrhea and constipation.  Musculoskeletal: Negative for arthralgias (not at this time).  Skin:       Rectal itching and irritation.  Neurological: Positive for headaches (for 24 hours). Negative for light-headedness.  Psychiatric/Behavioral: Negative for confusion.       Objective:   Physical Exam  Constitutional: He is oriented to person, place, and time. He appears well-developed and well-nourished.  HENT:  Head: Normocephalic.  Eyes: Conjunctivae are normal.  Neck: Normal range of motion.  Cardiovascular: Normal rate.   Pulmonary/Chest: Effort normal.  Musculoskeletal: Normal range of motion.  Neurological: He is alert and oriented to person, place, and time.  Skin: Skin is warm.  Perianal area with mild erythema, no lesions.    BP 128/80 mmHg  Pulse 79  Temp(Src) 97.7 F (36.5 C) (Oral)  Resp 16  Ht  (1.778  m)  Wt 172 lb (78.019 kg)  BMI 24.68 kg/m2  SpO2 97%     Assessment & Plan:  1. Anal itching - suspect candida, discussed importance of keeping as clean and dry as possible - nystatin-triamcinolone (MYCOLOG II) cream; Apply 1 application topically 2 (two) times daily. For a maximum of 10 days.  Dispense: 30 g; Refill: 0 - RTC if no improvement or if worsening symptoms.   2. Medication intolerance - discussed meloxicam and common side effects. Not sure if he had side effects from meloxicam or if he is getting a viral illness.  - discussed alternative such as OTC ibuprofen/naproxen  Olean Ree, FNP-BC  [facility]  12/26/2015 7:33 AM

## 2015-12-31 DIAGNOSIS — M25641 Stiffness of right hand, not elsewhere classified: Secondary | ICD-10-CM | POA: Diagnosis not present

## 2015-12-31 DIAGNOSIS — M25642 Stiffness of left hand, not elsewhere classified: Secondary | ICD-10-CM | POA: Diagnosis not present

## 2015-12-31 DIAGNOSIS — M542 Cervicalgia: Secondary | ICD-10-CM | POA: Diagnosis not present

## 2016-01-01 ENCOUNTER — Ambulatory Visit (INDEPENDENT_AMBULATORY_CARE_PROVIDER_SITE_OTHER): Payer: Medicare HMO | Admitting: Family Medicine

## 2016-01-01 VITALS — BP 108/70 | HR 103 | Temp 98.4°F | Resp 16 | Ht 70.0 in | Wt 166.8 lb

## 2016-01-01 DIAGNOSIS — H6123 Impacted cerumen, bilateral: Secondary | ICD-10-CM | POA: Diagnosis not present

## 2016-01-01 DIAGNOSIS — N411 Chronic prostatitis: Secondary | ICD-10-CM | POA: Diagnosis not present

## 2016-01-01 DIAGNOSIS — R3911 Hesitancy of micturition: Secondary | ICD-10-CM | POA: Diagnosis not present

## 2016-01-01 LAB — POC MICROSCOPIC URINALYSIS (UMFC): Mucus: ABSENT

## 2016-01-01 LAB — POCT CBC
Granulocyte percent: 82.1 %G — AB (ref 37–80)
HCT, POC: 45 % (ref 43.5–53.7)
Hemoglobin: 15.8 g/dL (ref 14.1–18.1)
Lymph, poc: 2.6 (ref 0.6–3.4)
MCH: 32.8 pg — AB (ref 27–31.2)
MCHC: 35 g/dL (ref 31.8–35.4)
MCV: 93.5 fL (ref 80–97)
MID (cbc): 0.3 (ref 0–0.9)
MPV: 7.8 fL (ref 0–99.8)
POC GRANULOCYTE: 13.5 — AB (ref 2–6.9)
POC LYMPH PERCENT: 16 %L (ref 10–50)
POC MID %: 1.9 % (ref 0–12)
Platelet Count, POC: 205 10*3/uL (ref 142–424)
RBC: 4.81 M/uL (ref 4.69–6.13)
RDW, POC: 12.6 %
WBC: 16.4 10*3/uL — AB (ref 4.6–10.2)

## 2016-01-01 LAB — POCT URINALYSIS DIP (MANUAL ENTRY)
Bilirubin, UA: NEGATIVE
Glucose, UA: NEGATIVE
Ketones, POC UA: NEGATIVE
NITRITE UA: NEGATIVE
Spec Grav, UA: 1.01
UROBILINOGEN UA: 1
pH, UA: 6

## 2016-01-01 MED ORDER — SULFAMETHOXAZOLE-TRIMETHOPRIM 800-160 MG PO TABS: 1.0000 | ORAL_TABLET | Freq: Two times a day (BID) | ORAL | Status: AC

## 2016-01-01 NOTE — Patient Instructions (Signed)
Take bactrim twice a day for 2 weeks Call dr Isabel Caprice and see if you can get an appt with him. I will call you with your lab results. Return if symptoms do not improve or at any time if symptoms worsen.

## 2016-01-01 NOTE — Progress Notes (Signed)
[facility] [address], [place] [address] [phone]  Date:  01/01/2016   Name:  Richard Velazquez   DOB:  October 24, 1956   MRN:  [id number]  PCP:  No primary care provider on file.    Chief Complaint: prostate pain   History of Present Illness:  This is a 60 y.o. male with PMH HTN, HIV, depression, anxiety, prostatitis who is presenting with prostate pain x 1 week.  Pain in lower abdomen/at base of penis with urinating. Weak stream. No rectal pain. No fever but feeling a little feverish. Had chills last night. Had bilateral low back pain last night, this better now. No n/v. Under care of [facility], Dr. Isabel Caprice. Goes there every 4 months. Followed for BPH and recurrent prostate infections. Never had biopsy, no mention of prostate cancer. PSA has been elevated in the past to 4.6 - 5.2 Pt is followed by Dr. Sheral Flow in [place] for his HIV. Pt states his viral load has been undetectable for many years.  Had tendonitis from levoquin in the past.  Pt also wanting his ears cleaned out. States they have been itching some lately.  Review of Systems:  Review of Systems See HPI  Patient Active Problem List   Diagnosis Date Noted  . Anxiety 05/08/2015  . Essential (primary) hypertension 09/19/2014  . Arthritis of hand, degenerative 09/19/2014  . Amnesia 03/01/2014  . HIV disease (HCC) 11/04/2013  . Diverticulitis 09/20/2013  . Fatigue 09/20/2013  . Prostatitis 09/20/2013  . Carpal tunnel syndrome 03/01/2013  . Clinical depression 03/01/2013  . Lumbar disc disease with radiculopathy 03/01/2013    Prior to Admission medications   Medication Sig Start Date End Date Taking? Authorizing Provider  Ascorbic Acid (VITAMIN C) 100 MG tablet Take 500 mg by mouth daily.    Yes Historical Provider, MD  buPROPion (WELLBUTRIN SR) 150 MG 12 hr tablet Take 150 mg by mouth 2 (two) times daily.   Yes Historical Provider, MD  cyanocobalamin 2000 MCG tablet Take 2,000  mcg by mouth daily.   Yes Historical Provider, MD  diphenoxylate-atropine (LOMOTIL) 2.5-0.025 MG/5ML liquid Take 5 mLs by mouth 4 (four) times daily as needed.   Yes Historical Provider, MD  fish oil-omega-3 fatty acids 1000 MG capsule Take 2 g by mouth daily.   Yes Historical Provider, MD  lamivudine (EPIVIR) 300 MG tablet Take 300 mg by mouth daily.   Yes Historical Provider, MD  lisinopril-hydrochlorothiazide (PRINZIDE,ZESTORETIC) 10-12.5 MG per tablet Take 1 tablet by mouth daily.   Yes Historical Provider, MD  LORazepam (ATIVAN) 0.5 MG tablet Take 0.5 mg by mouth every 8 (eight) hours.   Yes Historical Provider, MD  Melatonin 10 MG TABS Take 5 mg by mouth.    Yes Historical Provider, MD  meloxicam (MOBIC) 15 MG tablet Take 15 mg by mouth daily. 12/22/15  Yes Historical Provider, MD  silodosin (RAPAFLO) 8 MG CAPS capsule Take 8 mg by mouth 2 (two) times daily.   Yes Historical Provider, MD  tenofovir (VIREAD) 300 MG tablet Take 300 mg by mouth daily.   Yes Historical Provider, MD  Vitamin D, Ergocalciferol, (DRISDOL) 50000 UNITS CAPS Take 50,000 Units by mouth.   Yes Historical Provider, MD  nystatin-triamcinolone (MYCOLOG II) cream Apply 1 application topically 2 (two) times daily. For a maximum of 10 days. Patient not taking: Reported on 01/01/2016 12/23/15   Emi Belfast, FNP    Allergies  Allergen Reactions  . Androgel [Testosterone] Anaphylaxis  .  Clindamycin/Lincomycin Other (See Comments)    Tendonitis   . Levaquin [Levofloxacin In D5w] Other (See Comments)    Tendonitis    . Trazodone And Nefazodone Other (See Comments)    prioprism   . Sildenafil Rash    Anxiety, palpitations, no effect of drug    History reviewed. No pertinent past surgical history.  Social History  Substance Use Topics  . Smoking status: Never Smoker   . Smokeless tobacco: Never Used  . Alcohol Use: Yes    History reviewed. No pertinent family history.  Medication list has been reviewed and  updated.  Physical Examination:  Physical Exam  Constitutional: He is oriented to person, place, and time. He appears well-developed and well-nourished. No distress.  HENT:  Head: Normocephalic and atraumatic.  Right Ear: Hearing, external ear and ear canal normal.  Left Ear: Hearing, external ear and ear canal normal.  Nose: Nose normal.  Bilateral TMs with cerumen partially blocking TM  Eyes: Conjunctivae and lids are normal. Right eye exhibits no discharge. Left eye exhibits no discharge. No scleral icterus.  Cardiovascular: Normal rate, regular rhythm, normal heart sounds and normal pulses.   No murmur heard. Pulmonary/Chest: Effort normal and breath sounds normal. No respiratory distress. He has no wheezes. He has no rhonchi. He has no rales.  Abdominal: Soft. Normal appearance and bowel sounds are normal. There is tenderness (lower abdomen).  Genitourinary: Rectum normal. Prostate is enlarged and tender.  Musculoskeletal: Normal range of motion.  Lymphadenopathy:    He has no cervical adenopathy.  Neurological: He is alert and oriented to person, place, and time.  Skin: Skin is warm, dry and intact. No lesion and no rash noted.  Psychiatric: He has a normal mood and affect. His speech is normal and behavior is normal. Thought content normal.   BP 108/70 mmHg  Pulse 103  Temp(Src) 98.4 F (36.9 C) (Oral)  Resp 16  Ht  (1.778 m)  Wt 166 lb 12.8 oz (75.66 kg)  BMI 23.93 kg/m2  SpO2 96%  Results for orders placed or performed in visit on 01/01/16  POCT CBC  Result Value Ref Range   WBC 16.4 (A) 4.6 - 10.2 K/uL   Lymph, poc 2.6 0.6 - 3.4   POC LYMPH PERCENT 16.0 10 - 50 %L   MID (cbc) 0.3 0 - 0.9   POC MID % 1.9 0 - 12 %M   POC Granulocyte 13.5 (A) 2 - 6.9   Granulocyte percent 82.1 (A) 37 - 80 %G   RBC 4.81 4.69 - 6.13 M/uL   Hemoglobin 15.8 14.1 - 18.1 g/dL   HCT, POC 16.1 09.6 - 53.7 %   MCV 93.5 80 - 97 fL   MCH, POC 32.8 (A) 27 - 31.2 pg   MCHC 35.0 31.8  - 35.4 g/dL   RDW, POC 04.5 %   Platelet Count, POC 205 142 - 424 K/uL   MPV 7.8 0 - 99.8 fL  POCT Microscopic Urinalysis ([facility])  Result Value Ref Range   WBC,UR,HPF,POC Few (A) None WBC/hpf   RBC,UR,HPF,POC None None RBC/hpf   Bacteria Few (A) None, Too numerous to count   Mucus Absent Absent   Epithelial Cells, UR Per Microscopy Few (A) None, Too numerous to count cells/hpf  POCT urinalysis dipstick  Result Value Ref Range   Color, UA yellow yellow   Clarity, UA clear clear   Glucose, UA negative negative   Bilirubin, UA negative negative   Ketones, POC UA negative  negative   Spec Grav, UA 1.010    Blood, UA moderate (A) negative   pH, UA 6.0    Protein Ur, POC trace (A) negative   Urobilinogen, UA 1.0    Nitrite, UA Negative Negative   Leukocytes, UA small (1+) (A) Negative   Assessment and Plan:  1. Chronic prostatitis 2. Urinary hesitancy UA with mod blood and small leuks. CBC with wbc 16.5 with left shift. PSA, CMP, G/C and urine culture pending. Will treat flare in prostatitis with bactrim bid x 14 days. He likely needs prolonged treatment - advised he contact his urologist, Dr. Isabel Caprice, for guidance on duration on therapy. Advised if symptoms not improving over the next week or if symptoms change/worsen at any time RTC. - sulfamethoxazole-trimethoprim (BACTRIM DS,SEPTRA DS) 800-160 MG tablet; Take 1 tablet by mouth 2 (two) times daily.  Dispense: 28 tablet; Refill: 0 - Urine culture - POCT CBC - POCT Microscopic Urinalysis ([facility]) - POCT urinalysis dipstick - Comprehensive metabolic panel - PSA - GC/Chlamydia Probe Amp  3. Cerumen impaction Ear lavage performed. Symptoms improved after.   [place] Brackett, MHS [facility]  01/01/2016

## 2016-01-02 LAB — COMPREHENSIVE METABOLIC PANEL
ALBUMIN: 4.2 g/dL (ref 3.6–5.1)
ALT: 18 U/L (ref 9–46)
AST: 18 U/L (ref 10–35)
Alkaline Phosphatase: 82 U/L (ref 40–115)
BUN: 15 mg/dL (ref 7–25)
CALCIUM: 9.6 mg/dL (ref 8.6–10.3)
CHLORIDE: 100 mmol/L (ref 98–110)
CO2: 25 mmol/L (ref 20–31)
Creat: 1.16 mg/dL (ref 0.70–1.33)
GLUCOSE: 108 mg/dL — AB (ref 65–99)
POTASSIUM: 4.2 mmol/L (ref 3.5–5.3)
Sodium: 137 mmol/L (ref 135–146)
Total Bilirubin: 5.6 mg/dL — ABNORMAL HIGH (ref 0.2–1.2)
Total Protein: 6.9 g/dL (ref 6.1–8.1)

## 2016-01-02 LAB — GC/CHLAMYDIA PROBE AMP
CT PROBE, AMP APTIMA: NOT DETECTED
GC PROBE AMP APTIMA: NOT DETECTED

## 2016-01-02 LAB — PSA: PSA: 44.93 ng/mL — ABNORMAL HIGH (ref <=4.00)

## 2016-01-03 LAB — URINE CULTURE: Colony Count: >=100000

## 2016-01-05 DIAGNOSIS — Z0000 Encounter for general adult medical examination without abnormal findings: Secondary | ICD-10-CM | POA: Diagnosis not present

## 2016-01-05 DIAGNOSIS — N138 Other obstructive and reflux uropathy: Secondary | ICD-10-CM | POA: Diagnosis not present

## 2016-01-05 DIAGNOSIS — N411 Chronic prostatitis: Secondary | ICD-10-CM | POA: Diagnosis not present

## 2016-01-05 DIAGNOSIS — N401 Enlarged prostate with lower urinary tract symptoms: Secondary | ICD-10-CM | POA: Diagnosis not present

## 2016-01-07 ENCOUNTER — Telehealth: Payer: Self-pay | Admitting: Physician Assistant

## 2016-01-07 DIAGNOSIS — M25642 Stiffness of left hand, not elsewhere classified: Secondary | ICD-10-CM | POA: Diagnosis not present

## 2016-01-07 DIAGNOSIS — M25641 Stiffness of right hand, not elsewhere classified: Secondary | ICD-10-CM | POA: Diagnosis not present

## 2016-01-07 DIAGNOSIS — M542 Cervicalgia: Secondary | ICD-10-CM | POA: Diagnosis not present

## 2016-01-07 NOTE — Telephone Encounter (Signed)
Spoke with Nurse, Zella Ball at [facility]. Spoke with her about PSA of 44. She asked that I fax over the results and my office notes and they would work on getting pt worked in with Dr. Isabel Caprice ASAP. Pt notified.

## 2016-01-14 DIAGNOSIS — M25641 Stiffness of right hand, not elsewhere classified: Secondary | ICD-10-CM | POA: Diagnosis not present

## 2016-01-14 DIAGNOSIS — M25642 Stiffness of left hand, not elsewhere classified: Secondary | ICD-10-CM | POA: Diagnosis not present

## 2016-01-14 DIAGNOSIS — M542 Cervicalgia: Secondary | ICD-10-CM | POA: Diagnosis not present

## 2016-01-21 DIAGNOSIS — M542 Cervicalgia: Secondary | ICD-10-CM | POA: Diagnosis not present

## 2016-01-21 DIAGNOSIS — M79641 Pain in right hand: Secondary | ICD-10-CM | POA: Diagnosis not present

## 2016-01-21 DIAGNOSIS — M25642 Stiffness of left hand, not elsewhere classified: Secondary | ICD-10-CM | POA: Diagnosis not present

## 2016-01-21 DIAGNOSIS — M25641 Stiffness of right hand, not elsewhere classified: Secondary | ICD-10-CM | POA: Diagnosis not present

## 2016-01-23 DIAGNOSIS — M25641 Stiffness of right hand, not elsewhere classified: Secondary | ICD-10-CM | POA: Diagnosis not present

## 2016-01-23 DIAGNOSIS — M25642 Stiffness of left hand, not elsewhere classified: Secondary | ICD-10-CM | POA: Diagnosis not present

## 2016-01-23 DIAGNOSIS — M542 Cervicalgia: Secondary | ICD-10-CM | POA: Diagnosis not present

## 2016-01-26 DIAGNOSIS — M542 Cervicalgia: Secondary | ICD-10-CM | POA: Diagnosis not present

## 2016-01-26 DIAGNOSIS — M25642 Stiffness of left hand, not elsewhere classified: Secondary | ICD-10-CM | POA: Diagnosis not present

## 2016-01-26 DIAGNOSIS — M25641 Stiffness of right hand, not elsewhere classified: Secondary | ICD-10-CM | POA: Diagnosis not present

## 2016-01-30 DIAGNOSIS — M25642 Stiffness of left hand, not elsewhere classified: Secondary | ICD-10-CM | POA: Diagnosis not present

## 2016-01-30 DIAGNOSIS — M542 Cervicalgia: Secondary | ICD-10-CM | POA: Diagnosis not present

## 2016-01-30 DIAGNOSIS — M25641 Stiffness of right hand, not elsewhere classified: Secondary | ICD-10-CM | POA: Diagnosis not present

## 2016-02-02 DIAGNOSIS — M542 Cervicalgia: Secondary | ICD-10-CM | POA: Diagnosis not present

## 2016-02-02 DIAGNOSIS — M25641 Stiffness of right hand, not elsewhere classified: Secondary | ICD-10-CM | POA: Diagnosis not present

## 2016-02-02 DIAGNOSIS — M25642 Stiffness of left hand, not elsewhere classified: Secondary | ICD-10-CM | POA: Diagnosis not present

## 2016-02-06 DIAGNOSIS — M25641 Stiffness of right hand, not elsewhere classified: Secondary | ICD-10-CM | POA: Diagnosis not present

## 2016-02-06 DIAGNOSIS — R69 Illness, unspecified: Secondary | ICD-10-CM | POA: Diagnosis not present

## 2016-02-06 DIAGNOSIS — M542 Cervicalgia: Secondary | ICD-10-CM | POA: Diagnosis not present

## 2016-02-06 DIAGNOSIS — M25642 Stiffness of left hand, not elsewhere classified: Secondary | ICD-10-CM | POA: Diagnosis not present

## 2016-02-09 DIAGNOSIS — M25642 Stiffness of left hand, not elsewhere classified: Secondary | ICD-10-CM | POA: Diagnosis not present

## 2016-02-09 DIAGNOSIS — M542 Cervicalgia: Secondary | ICD-10-CM | POA: Diagnosis not present

## 2016-02-09 DIAGNOSIS — M25641 Stiffness of right hand, not elsewhere classified: Secondary | ICD-10-CM | POA: Diagnosis not present

## 2016-02-13 DIAGNOSIS — M25641 Stiffness of right hand, not elsewhere classified: Secondary | ICD-10-CM | POA: Diagnosis not present

## 2016-02-13 DIAGNOSIS — M542 Cervicalgia: Secondary | ICD-10-CM | POA: Diagnosis not present

## 2016-02-13 DIAGNOSIS — M25642 Stiffness of left hand, not elsewhere classified: Secondary | ICD-10-CM | POA: Diagnosis not present

## 2016-02-17 DIAGNOSIS — M25642 Stiffness of left hand, not elsewhere classified: Secondary | ICD-10-CM | POA: Diagnosis not present

## 2016-02-17 DIAGNOSIS — M542 Cervicalgia: Secondary | ICD-10-CM | POA: Diagnosis not present

## 2016-02-17 DIAGNOSIS — M25641 Stiffness of right hand, not elsewhere classified: Secondary | ICD-10-CM | POA: Diagnosis not present

## 2016-02-20 DIAGNOSIS — M25641 Stiffness of right hand, not elsewhere classified: Secondary | ICD-10-CM | POA: Diagnosis not present

## 2016-02-20 DIAGNOSIS — M25642 Stiffness of left hand, not elsewhere classified: Secondary | ICD-10-CM | POA: Diagnosis not present

## 2016-02-20 DIAGNOSIS — M542 Cervicalgia: Secondary | ICD-10-CM | POA: Diagnosis not present

## 2016-02-24 DIAGNOSIS — M25642 Stiffness of left hand, not elsewhere classified: Secondary | ICD-10-CM | POA: Diagnosis not present

## 2016-02-24 DIAGNOSIS — M542 Cervicalgia: Secondary | ICD-10-CM | POA: Diagnosis not present

## 2016-02-24 DIAGNOSIS — M25641 Stiffness of right hand, not elsewhere classified: Secondary | ICD-10-CM | POA: Diagnosis not present

## 2016-02-27 DIAGNOSIS — M25641 Stiffness of right hand, not elsewhere classified: Secondary | ICD-10-CM | POA: Diagnosis not present

## 2016-02-27 DIAGNOSIS — M25642 Stiffness of left hand, not elsewhere classified: Secondary | ICD-10-CM | POA: Diagnosis not present

## 2016-02-27 DIAGNOSIS — M542 Cervicalgia: Secondary | ICD-10-CM | POA: Diagnosis not present

## 2016-03-01 DIAGNOSIS — M25642 Stiffness of left hand, not elsewhere classified: Secondary | ICD-10-CM | POA: Diagnosis not present

## 2016-03-01 DIAGNOSIS — M542 Cervicalgia: Secondary | ICD-10-CM | POA: Diagnosis not present

## 2016-03-01 DIAGNOSIS — M25641 Stiffness of right hand, not elsewhere classified: Secondary | ICD-10-CM | POA: Diagnosis not present

## 2016-03-03 DIAGNOSIS — M25641 Stiffness of right hand, not elsewhere classified: Secondary | ICD-10-CM | POA: Diagnosis not present

## 2016-03-03 DIAGNOSIS — M25642 Stiffness of left hand, not elsewhere classified: Secondary | ICD-10-CM | POA: Diagnosis not present

## 2016-03-03 DIAGNOSIS — M542 Cervicalgia: Secondary | ICD-10-CM | POA: Diagnosis not present

## 2016-03-12 DIAGNOSIS — M542 Cervicalgia: Secondary | ICD-10-CM | POA: Diagnosis not present

## 2016-03-12 DIAGNOSIS — M25642 Stiffness of left hand, not elsewhere classified: Secondary | ICD-10-CM | POA: Diagnosis not present

## 2016-03-12 DIAGNOSIS — M4806 Spinal stenosis, lumbar region: Secondary | ICD-10-CM | POA: Diagnosis not present

## 2016-03-12 DIAGNOSIS — M25641 Stiffness of right hand, not elsewhere classified: Secondary | ICD-10-CM | POA: Diagnosis not present

## 2016-03-19 DIAGNOSIS — M542 Cervicalgia: Secondary | ICD-10-CM | POA: Diagnosis not present

## 2016-03-19 DIAGNOSIS — M25641 Stiffness of right hand, not elsewhere classified: Secondary | ICD-10-CM | POA: Diagnosis not present

## 2016-03-19 DIAGNOSIS — M25642 Stiffness of left hand, not elsewhere classified: Secondary | ICD-10-CM | POA: Diagnosis not present

## 2016-03-29 DIAGNOSIS — R972 Elevated prostate specific antigen [PSA]: Secondary | ICD-10-CM | POA: Diagnosis not present

## 2016-04-01 DIAGNOSIS — M542 Cervicalgia: Secondary | ICD-10-CM | POA: Diagnosis not present

## 2016-04-01 DIAGNOSIS — M25642 Stiffness of left hand, not elsewhere classified: Secondary | ICD-10-CM | POA: Diagnosis not present

## 2016-04-01 DIAGNOSIS — M25641 Stiffness of right hand, not elsewhere classified: Secondary | ICD-10-CM | POA: Diagnosis not present

## 2016-04-05 DIAGNOSIS — R351 Nocturia: Secondary | ICD-10-CM | POA: Diagnosis not present

## 2016-04-05 DIAGNOSIS — R972 Elevated prostate specific antigen [PSA]: Secondary | ICD-10-CM | POA: Diagnosis not present

## 2016-04-05 DIAGNOSIS — Z0000 Encounter for general adult medical examination without abnormal findings: Secondary | ICD-10-CM | POA: Diagnosis not present

## 2016-04-05 DIAGNOSIS — N138 Other obstructive and reflux uropathy: Secondary | ICD-10-CM | POA: Diagnosis not present

## 2016-04-05 DIAGNOSIS — N401 Enlarged prostate with lower urinary tract symptoms: Secondary | ICD-10-CM | POA: Diagnosis not present

## 2016-04-05 DIAGNOSIS — N411 Chronic prostatitis: Secondary | ICD-10-CM | POA: Diagnosis not present

## 2016-04-23 DIAGNOSIS — M542 Cervicalgia: Secondary | ICD-10-CM | POA: Diagnosis not present

## 2016-04-23 DIAGNOSIS — M25642 Stiffness of left hand, not elsewhere classified: Secondary | ICD-10-CM | POA: Diagnosis not present

## 2016-04-23 DIAGNOSIS — M25641 Stiffness of right hand, not elsewhere classified: Secondary | ICD-10-CM | POA: Diagnosis not present

## 2016-06-21 DIAGNOSIS — H903 Sensorineural hearing loss, bilateral: Secondary | ICD-10-CM | POA: Diagnosis not present

## 2016-06-21 DIAGNOSIS — H9313 Tinnitus, bilateral: Secondary | ICD-10-CM | POA: Diagnosis not present

## 2016-06-22 DIAGNOSIS — H9313 Tinnitus, bilateral: Secondary | ICD-10-CM | POA: Diagnosis not present

## 2016-06-22 DIAGNOSIS — H906 Mixed conductive and sensorineural hearing loss, bilateral: Secondary | ICD-10-CM | POA: Diagnosis not present

## 2016-07-07 DIAGNOSIS — R69 Illness, unspecified: Secondary | ICD-10-CM | POA: Diagnosis not present

## 2016-07-19 DIAGNOSIS — Z1211 Encounter for screening for malignant neoplasm of colon: Secondary | ICD-10-CM | POA: Diagnosis not present

## 2016-07-19 DIAGNOSIS — R69 Illness, unspecified: Secondary | ICD-10-CM | POA: Diagnosis not present

## 2016-07-27 DIAGNOSIS — Z1211 Encounter for screening for malignant neoplasm of colon: Secondary | ICD-10-CM | POA: Diagnosis not present

## 2016-07-27 DIAGNOSIS — K5730 Diverticulosis of large intestine without perforation or abscess without bleeding: Secondary | ICD-10-CM | POA: Diagnosis not present

## 2016-10-27 DIAGNOSIS — H524 Presbyopia: Secondary | ICD-10-CM | POA: Diagnosis not present

## 2017-03-22 DIAGNOSIS — R972 Elevated prostate specific antigen [PSA]: Secondary | ICD-10-CM | POA: Diagnosis not present

## 2017-03-24 DIAGNOSIS — R69 Illness, unspecified: Secondary | ICD-10-CM | POA: Diagnosis not present

## 2017-03-24 DIAGNOSIS — R413 Other amnesia: Secondary | ICD-10-CM | POA: Diagnosis not present

## 2017-03-24 DIAGNOSIS — Z23 Encounter for immunization: Secondary | ICD-10-CM | POA: Diagnosis not present

## 2017-03-24 DIAGNOSIS — M19041 Primary osteoarthritis, right hand: Secondary | ICD-10-CM | POA: Diagnosis not present

## 2017-03-24 DIAGNOSIS — R5382 Chronic fatigue, unspecified: Secondary | ICD-10-CM | POA: Diagnosis not present

## 2017-03-24 DIAGNOSIS — M5116 Intervertebral disc disorders with radiculopathy, lumbar region: Secondary | ICD-10-CM | POA: Diagnosis not present

## 2017-03-24 DIAGNOSIS — N419 Inflammatory disease of prostate, unspecified: Secondary | ICD-10-CM | POA: Diagnosis not present

## 2017-03-29 DIAGNOSIS — N401 Enlarged prostate with lower urinary tract symptoms: Secondary | ICD-10-CM | POA: Diagnosis not present

## 2017-03-29 DIAGNOSIS — R351 Nocturia: Secondary | ICD-10-CM | POA: Diagnosis not present

## 2017-04-27 DIAGNOSIS — R69 Illness, unspecified: Secondary | ICD-10-CM | POA: Diagnosis not present

## 2017-04-29 ENCOUNTER — Encounter: Payer: Self-pay | Admitting: Urgent Care

## 2017-04-29 ENCOUNTER — Ambulatory Visit (INDEPENDENT_AMBULATORY_CARE_PROVIDER_SITE_OTHER): Payer: Medicare HMO | Admitting: Urgent Care

## 2017-04-29 VITALS — BP 120/78 | HR 80 | Temp 97.8°F | Resp 16 | Ht 71.0 in | Wt 165.2 lb

## 2017-04-29 DIAGNOSIS — R519 Headache, unspecified: Secondary | ICD-10-CM

## 2017-04-29 DIAGNOSIS — W57XXXA Bitten or stung by nonvenomous insect and other nonvenomous arthropods, initial encounter: Secondary | ICD-10-CM

## 2017-04-29 DIAGNOSIS — K5792 Diverticulitis of intestine, part unspecified, without perforation or abscess without bleeding: Secondary | ICD-10-CM

## 2017-04-29 DIAGNOSIS — J029 Acute pharyngitis, unspecified: Secondary | ICD-10-CM | POA: Diagnosis not present

## 2017-04-29 DIAGNOSIS — R059 Cough, unspecified: Secondary | ICD-10-CM

## 2017-04-29 DIAGNOSIS — R05 Cough: Secondary | ICD-10-CM

## 2017-04-29 DIAGNOSIS — R300 Dysuria: Secondary | ICD-10-CM | POA: Diagnosis not present

## 2017-04-29 DIAGNOSIS — R1030 Lower abdominal pain, unspecified: Secondary | ICD-10-CM | POA: Diagnosis not present

## 2017-04-29 DIAGNOSIS — R51 Headache: Secondary | ICD-10-CM

## 2017-04-29 DIAGNOSIS — N411 Chronic prostatitis: Secondary | ICD-10-CM

## 2017-04-29 LAB — COMPREHENSIVE METABOLIC PANEL
ALK PHOS: 73 IU/L (ref 39–117)
ALT: 18 IU/L (ref 0–44)
AST: 22 IU/L (ref 0–40)
Albumin/Globulin Ratio: 1.9 (ref 1.2–2.2)
Albumin: 4.3 g/dL (ref 3.6–4.8)
BUN/Creatinine Ratio: 13 (ref 10–24)
BUN: 17 mg/dL (ref 8–27)
Bilirubin Total: 3 mg/dL — ABNORMAL HIGH (ref 0.0–1.2)
CO2: 23 mmol/L (ref 18–29)
CREATININE: 1.34 mg/dL — AB (ref 0.76–1.27)
Calcium: 9.7 mg/dL (ref 8.6–10.2)
Chloride: 102 mmol/L (ref 96–106)
GFR calc Af Amer: 66 mL/min/{1.73_m2} (ref >59)
GFR calc non Af Amer: 57 mL/min/{1.73_m2} — ABNORMAL LOW (ref >59)
GLOBULIN, TOTAL: 2.3 g/dL (ref 1.5–4.5)
GLUCOSE: 103 mg/dL — AB (ref 65–99)
Potassium: 4.6 mmol/L (ref 3.5–5.2)
Sodium: 141 mmol/L (ref 134–144)
Total Protein: 6.6 g/dL (ref 6.0–8.5)

## 2017-04-29 LAB — POCT URINALYSIS DIP (MANUAL ENTRY)
BILIRUBIN UA: NEGATIVE
BILIRUBIN UA: NEGATIVE mg/dL
Glucose, UA: NEGATIVE mg/dL
Nitrite, UA: NEGATIVE
Protein Ur, POC: NEGATIVE mg/dL
Urobilinogen, UA: 0.2 E.U./dL
pH, UA: 5.5 (ref 5.0–8.0)

## 2017-04-29 LAB — POCT CBC
GRANULOCYTE PERCENT: 54.4 % (ref 37–80)
HCT, POC: 44.8 % (ref 43.5–53.7)
HEMOGLOBIN: 15.4 g/dL (ref 14.1–18.1)
Lymph, poc: 2.3 (ref 0.6–3.4)
MCH: 31.9 pg — AB (ref 27–31.2)
MCHC: 34.4 g/dL (ref 31.8–35.4)
MCV: 92.6 fL (ref 80–97)
MID (cbc): 0.3 (ref 0–0.9)
MPV: 7.9 fL (ref 0–99.8)
POC Granulocyte: 3.1 (ref 2–6.9)
POC LYMPH PERCENT: 41 %L (ref 10–50)
POC MID %: 4.6 %M (ref 0–12)
Platelet Count, POC: 173 10*3/uL (ref 142–424)
RBC: 4.84 M/uL (ref 4.69–6.13)
RDW, POC: 12.3 %
WBC: 5.7 10*3/uL (ref 4.6–10.2)

## 2017-04-29 MED ORDER — SULFAMETHOXAZOLE-TRIMETHOPRIM 800-160 MG PO TABS: 1.0000 | ORAL_TABLET | Freq: Two times a day (BID) | ORAL | 1 refills | Status: DC

## 2017-04-29 NOTE — Patient Instructions (Addendum)
We will manage your current symptoms for prostatitis. But please make sure you monitor symptoms related to either a tick bite (fever, headache, sore throat, cough, malaise, fatigue, rash) or diverticulitis (worsening belly pain, bloody stools, fever). If these symptoms develop, then come back to our clinic as we will need to change antibiotic therapy and pursue more testing like imaging, blood-work.   Prostatitis Prostatitis is swelling or inflammation of the prostate gland. The prostate is a walnut-sized gland that is involved in the production of semen. It is located below a man's bladder, in front of the rectum. There are four types of prostatitis:  Chronic nonbacterial prostatitis. This is the most common type of prostatitis. It may be associated with a viral infection or autoimmune disorder.  Acute bacterial prostatitis. This is the least common type of prostatitis. It starts quickly and is usually associated with a bladder infection, high fever, and shaking chills. It can occur at any age.  Chronic bacterial prostatitis. This type usually results from acute bacterial prostatitis that happens repeatedly (is recurrent) or has not been treated properly. It can occur in men of any age but is most common among middle-aged men whose prostate has begun to get larger. The symptoms are not as severe as symptoms caused by acute bacterial prostatitis.  Prostatodynia or chronic pelvic pain syndrome (CPPS). This type is also called pelvic floor disorder. It is associated with increased muscular tone in the pelvis surrounding the prostate.  What are the causes? Bacterial prostatitis is caused by infection from bacteria. Chronic nonbacterial prostatitis may be caused by:  Urinary tract infections (UTIs).  Nerve damage.  A response by the body's disease-fighting system (autoimmune response).  Chemicals in the urine.  The causes of the other types of prostatitis are usually not known. What are the  signs or symptoms? Symptoms of this condition vary depending upon the type of prostatitis. If you have acute bacterial prostatitis, you may experience:  Urinary symptoms, such as: ? Painful urination. ? Burning during urination. ? Frequent and sudden urges to urinate. ? Inability to start urinating. ? A weak or interrupted stream of urine.  Vomiting.  Nausea.  Fever.  Chills.  Inability to empty the bladder completely.  Pain in the: ? Muscles or joints. ? Lower back. ? Lower abdomen.  If you have any of the other types of prostatitis, you may experience:  Urinary symptoms, such as: ? Sudden urges to urinate. ? Frequent urination. ? Difficulty starting urination. ? Weak urine stream. ? Dribbling after urination.  Discharge from the urethra. The urethra is a tube that opens at the end of the penis.  Pain in the: ? Testicles. ? Penis or tip of the penis. ? Rectum. ? Area in front of the rectum and below the scrotum (perineum).  Problems with sexual function.  Painful ejaculation.  Bloody semen.  How is this diagnosed? This condition may be diagnosed based on:  A physical and medical exam.  Your symptoms.  A urine test to check for bacteria.  An exam in which a health care provider uses a finger to feel the prostate (digital rectal exam).  A test of a sample of semen.  Blood tests.  Ultrasound.  Removal of prostate tissue to be examined under a microscope (biopsy).  Tests to check how your body handles urine (urodynamic tests).  A test to look inside your bladder or urethra (cystoscopy).  How is this treated? Treatment for this condition depends on the type of prostatitis. Treatment  may involve:  Medicines to relieve pain or inflammation.  Medicines to help relax your muscles.  Physical therapy.  Heat therapy.  Techniques to help you control certain body functions (biofeedback).  Relaxation exercises.  Antibiotic medicine, if your  condition is caused by bacteria.  Warm water baths (sitz baths). Sitz baths help with relaxing your pelvic floor muscles, which helps to relieve pressure on the prostate.  Follow these instructions at home:  Take over-the-counter and prescription medicines only as told by your health care provider.  If you were prescribed an antibiotic, take it as told by your health care provider. Do not stop taking the antibiotic even if you start to feel better.  If physical therapy, biofeedback, or relaxation exercises were prescribed, do exercises as instructed.  Take sitz baths as directed by your health care provider. For a sitz bath, sit in warm water that is deep enough to cover your hips and buttocks.  Keep all follow-up visits as told by your health care provider. This is important. Contact a health care provider if:  Your symptoms get worse.  You have a fever. Get help right away if:  You have chills.  You feel nauseous.  You vomit.  You feel light-headed or feel like you are going to faint.  You are unable to urinate.  You have blood or blood clots in your urine. This information is not intended to replace advice given to you by your health care provider. Make sure you discuss any questions you have with your health care provider. Document Released: 11/05/2000 Document Revised: 07/29/2016 Document Reviewed: 07/29/2016 Elsevier Interactive Patient Education  2017 [employer]     IF you received an x-ray today, you will receive an invoice from [facility]. Please contact [facility] at [phone] with questions or concerns regarding your invoice.   IF you received labwork today, you will receive an invoice from [facility]. Please contact [facility] at [phone] with questions or concerns regarding your invoice.   Our billing staff will not be able to assist you with questions regarding bills from these companies.  You will be contacted with the lab  results as soon as they are available. The fastest way to get your results is to activate your My Chart account. Instructions are located on the last page of this paperwork. If you have not heard from us regarding the results in 2 weeks, please contact this office.

## 2017-04-29 NOTE — Progress Notes (Signed)
MRN: [id number] DOB: 03/18/1956  Subjective:   Richard HiltsRichard F Val is a 61 y.o. male presenting for chief complaint of Prostate infection (Burning sensation, dysuria); Headache; and Abdominal Pain (Lower abdomen with nausea)  Reports 1 day history of dysuria, left lower abdominal pain, nausea without vomiting, subjective fever, cough, sore throat. Patient has been outdoors and doing yardwork, had a tick bite ~2 weeks ago. Has a history of prostatitis and bph. Sees Dr. Isabel CapriceGrapey once yearly. Last OV was 03/2017, no significant findings. Also has diverticulosis, diverticulitis, had colonoscopy 08/2016 and did not have any significant findings. Sees Dr. Jeani HawkingPatrick Hung. Denies smoking cigarettes. Has a glass of wine nightly.  Kaleth has a current medication list which includes the following prescription(s): vitamin c, bupropion, cyanocobalamin, diphenoxylate-atropine, fish oil-omega-3 fatty acids, lamivudine, lisinopril-hydrochlorothiazide, lorazepam, meloxicam, silodosin, tenofovir alafenamide fumarate, and vitamin d (ergocalciferol). Also is allergic to androgel [testosterone]; clindamycin/lincomycin; levaquin [levofloxacin in d5w]; trazodone and nefazodone; and sildenafil. Irl  has a past medical history of Diverticulitis; HIV infection (HCC); and Hypertension. Also  has no past surgical history on file.  Objective:   Vitals: BP 120/78   Pulse 80   Temp 97.8 F (36.6 C) (Oral)   Resp 16   Ht 5\' 11"  (1.803 m)   Wt 165 lb 3.2 oz (74.9 kg)   SpO2 95%   BMI 23.04 kg/m   Physical Exam  Constitutional: He is oriented to person, place, and time. He appears well-developed and well-nourished.  Cardiovascular: Normal rate, regular rhythm and intact distal pulses.  Exam reveals no gallop and no friction rub.   No murmur heard. Pulmonary/Chest: No respiratory distress. He has no wheezes. He has no rales.  Abdominal: Soft. Bowel sounds are normal. He exhibits no distension and no mass. There is  tenderness (left sided > over left pelvic/llq). There is no guarding.  Neurological: He is alert and oriented to person, place, and time.  Skin: Skin is warm and dry.   Results for orders placed or performed in visit on 04/29/17 (from the past 24 hour(s))  POCT urinalysis dipstick     Status: Abnormal   Collection Time: 04/29/17 11:45 AM  Result Value Ref Range   Color, UA light yellow (A) yellow   Clarity, UA clear clear   Glucose, UA negative negative mg/dL   Bilirubin, UA negative negative   Ketones, POC UA negative negative mg/dL   Spec Grav, UA <=4.098<=1.005 (A) 1.010 - 1.025   Blood, UA small (A) negative   pH, UA 5.5 5.0 - 8.0   Protein Ur, POC negative negative mg/dL   Urobilinogen, UA 0.2 0.2 or 1.0 E.U./dL   Nitrite, UA Negative Negative   Leukocytes, UA Small (1+) (A) Negative  POCT CBC     Status: Abnormal   Collection Time: 04/29/17 11:48 AM  Result Value Ref Range   WBC 5.7 4.6 - 10.2 K/uL   Lymph, poc 2.3 0.6 - 3.4   POC LYMPH PERCENT 41.0 10 - 50 %L   MID (cbc) 0.3 0 - 0.9   POC MID % 4.6 0 - 12 %M   POC Granulocyte 3.1 2 - 6.9   Granulocyte percent 54.4 37 - 80 %G   RBC 4.84 4.69 - 6.13 M/uL   Hemoglobin 15.4 14.1 - 18.1 g/dL   HCT, POC 11.944.8 14.743.5 - 53.7 %   MCV 92.6 80 - 97 fL   MCH, POC 31.9 (A) 27 - 31.2 pg   MCHC 34.4 31.8 - 35.4 g/dL  RDW, POC 12.3 %   Platelet Count, POC 173 142 - 424 K/uL   MPV 7.9 0 - 99.8 fL    Assessment and Plan :   1. Dysuria 2. Chronic prostatitis - Will manage as acute prostatitis with Bactrim. Urine culture and blood work are pending. Return-to-clinic precautions discussed, patient verbalized understanding.   3. Lower abdominal pain 4. Diverticulitis - Differential includes acute diverticulitis but is less likely given lack of wbc, bloody stools. Return-to-clinic precautions discussed, patient verbalized understanding.   5. Nonintractable headache, unspecified chronicity pattern, unspecified headache type 6. Cough 7. Sore  throat 8. Tick bite, initial encounter - Differential includes tick borne illness. Return-to-clinic precautions discussed, patient verbalized understanding.   Wallis Bamberg, PA-C [facility] [phone] 04/29/2017  11:32 AM

## 2017-04-30 ENCOUNTER — Ambulatory Visit: Payer: Medicare HMO | Admitting: Physician Assistant

## 2017-04-30 LAB — URINALYSIS, MICROSCOPIC ONLY
CASTS: NONE SEEN /LPF
EPITHELIAL CELLS (NON RENAL): NONE SEEN /HPF (ref 0–10)

## 2017-04-30 LAB — URINE CULTURE: Organism ID, Bacteria: NO GROWTH

## 2017-05-05 ENCOUNTER — Encounter: Payer: Self-pay | Admitting: Urgent Care

## 2017-05-05 ENCOUNTER — Ambulatory Visit (INDEPENDENT_AMBULATORY_CARE_PROVIDER_SITE_OTHER): Payer: Medicare HMO | Admitting: Urgent Care

## 2017-05-05 VITALS — BP 108/69 | HR 89 | Temp 97.9°F | Resp 16 | Ht 71.0 in | Wt 163.4 lb

## 2017-05-05 DIAGNOSIS — R7989 Other specified abnormal findings of blood chemistry: Secondary | ICD-10-CM | POA: Diagnosis not present

## 2017-05-05 DIAGNOSIS — Z87438 Personal history of other diseases of male genital organs: Secondary | ICD-10-CM

## 2017-05-05 DIAGNOSIS — R300 Dysuria: Secondary | ICD-10-CM | POA: Diagnosis not present

## 2017-05-05 LAB — POCT URINALYSIS DIP (MANUAL ENTRY)
Bilirubin, UA: NEGATIVE
Glucose, UA: NEGATIVE mg/dL
LEUKOCYTES UA: NEGATIVE
NITRITE UA: NEGATIVE
PROTEIN UA: NEGATIVE mg/dL
Spec Grav, UA: 1.025 (ref 1.010–1.025)
Urobilinogen, UA: 0.2 E.U./dL
pH, UA: 5.5 (ref 5.0–8.0)

## 2017-05-05 NOTE — Patient Instructions (Addendum)
You may take 500mg  Tylenol every 6 hours for pain and inflammation.     Serum Creatinine Test Why am I having this test? A creatinine test is performed to measure the amount of creatinine in your blood (serum). Creatinine is a waste product of normal muscle activity (contraction). The kidneys filter creatinine from your blood and remove it from your body through urination. Blood creatinine levels stay consistent in people whose muscle mass stays consistent. This level can be increased in people who perform resistance exercise to increase muscle mass. Because creatinine is removed from your body by the kidneys, this test is often used as a way to measure kidney function. Your health care provider may recommend this test if he or she suspects that you have a condition that is negatively affecting your kidney function. This test may also be done as a part of routine blood work used to assess your overall health. What kind of sample is taken? A blood sample is required for this test. It is usually collected by inserting a needle into a vein or by sticking a finger with a small needle. For children, the blood sample is usually collected by sticking the child's heel with a small needle. How do I prepare for this test? There is no preparation or fasting required for this test. What are the reference ranges? Reference ranges are considered healthy ranges established after testing a large group of healthy people. Reference ranges may vary among different people, labs, and hospitals. It is your responsibility to obtain your test results. Ask the lab or department performing the test when and how you will get your results.  Children or adolescents: ? Less than 72 years old: 0.1-0.4 mg/dL. ? 27-28 years old: 0.2-0.5 mg/dL. ? 50-71 years old: 0.3-0.6 mg/dL. ? 66-107 years old: 0.4-1 mg/dL.  Adult male: ? 63-67 years old: 0.5-1 mg/dL. ? 63-34 years old: 0.5-1.1 mg/dL. ? 61 years and older: 0.5-1.2  mg/dL.  Adult male: ? 53-33 years old: 0.6-1.2 mg/dL. ? 25-5 years old: 0.6-1.3 mg/dL. ? 61 years and older: 0.7-1.3 mg/dL.  The reference range may be higher in people who perform resistance exercise to increase muscle mass. What do the results mean? Abnormally high levels of serum creatinine can be caused by many health conditions. These may include:  Kidney disease.  Urinary tract obstruction.  Lower-than-normal blood flow to the kidneys.  Rhabdomyolysis. This occurs when muscle damage causes the release of molecules into the bloodstream, which results in kidney damage.  Acromegaly. This is a condition that causes enlarged bones.  Gigantism.  Abnormally low levels of serum creatinine can also be caused by many health conditions. These may include:  Conditions that cause you to be inactive throughout much of the day.  Decreased muscle mass.  Talk with your health care provider to discuss your results, treatment options, and if necessary, the need for more tests. Talk with your health care provider if you have any questions about your results. Talk with your health care provider to discuss your results, treatment options, and if necessary, the need for more tests. Talk with your health care provider if you have any questions about your results. This information is not intended to replace advice given to you by your health care provider. Make sure you discuss any questions you have with your health care provider. Document Released: 12/01/2004 Document Revised: 07/03/2016 Document Reviewed: 04/04/2014 Elsevier Interactive Patient Education  [employer].     IF you received an x-ray today,  you will receive an invoice from [facility]. Please contact [facility] at [phone] with questions or concerns regarding your invoice.   IF you received labwork today, you will receive an invoice from [facility]. Please contact [facility] at [phone] with  questions or concerns regarding your invoice.   Our billing staff will not be able to assist you with questions regarding bills from these companies.  You will be contacted with the lab results as soon as they are available. The fastest way to get your results is to activate your My Chart account. Instructions are located on the last page of this paperwork. If you have not heard from us regarding the results in 2 weeks, please contact this office.

## 2017-05-05 NOTE — Progress Notes (Signed)
MRN: [id number] DOB: 09/25/1956  Subjective:   Richard HiltsRichard F Velazquez is a 61 y.o. male presenting for follow up on prostatitis. Last OV was 04/29/2017, was started on Bactrim to cover for prostatitis. Labs showed elevated creatinine, decreased GFR. Still has dysuria albeit it is improved. Headache is improved. Denies fever, n/v, abdominal pain, hematuria, bloody stools. Denies signs of tick borne illness as discussed in clinic.  Richard Velazquez has a current medication list which includes the following prescription(s): vitamin c, bupropion, cyanocobalamin, diphenoxylate-atropine, fish oil-omega-3 fatty acids, lamivudine, lisinopril-hydrochlorothiazide, lorazepam, meloxicam, silodosin, sulfamethoxazole-trimethoprim, tenofovir alafenamide fumarate, and vitamin d (ergocalciferol). Also is allergic to androgel [testosterone]; clindamycin/lincomycin; levaquin [levofloxacin in d5w]; trazodone and nefazodone; and sildenafil. Richard Velazquez  has a past medical history of Diverticulitis; HIV infection (HCC); and Hypertension. Also denies past surgical history.  Objective:   Vitals: BP 108/69   Pulse 89   Temp 97.9 F (36.6 C) (Oral)   Resp 16   Ht 5\' 11"  (1.803 m)   Wt 163 lb 6.4 oz (74.1 kg)   SpO2 95%   BMI 22.79 kg/m   Physical Exam  Constitutional: He is oriented to person, place, and time. He appears well-developed and well-nourished.  Cardiovascular: Normal rate, regular rhythm and intact distal pulses.  Exam reveals no gallop and no friction rub.   No murmur heard. Pulmonary/Chest: No respiratory distress. He has no wheezes. He has no rales.  Abdominal: Soft. Bowel sounds are normal. He exhibits no distension and no mass. There is tenderness (mid pelvic to LLQ). There is no guarding.  Neurological: He is alert and oriented to person, place, and time.  Skin: Skin is warm and dry.  Psychiatric: He has a normal mood and affect.   Results for orders placed or performed in visit on 05/05/17 (from the past 24  hour(s))  Basic metabolic panel     Status: Abnormal   Collection Time: 05/05/17  4:25 PM  Result Value Ref Range   Glucose 100 (H) 65 - 99 mg/dL   BUN 25 8 - 27 mg/dL   Creatinine, Ser 1.471.24 0.76 - 1.27 mg/dL   GFR calc non Af Amer 63 >59 mL/min/1.73   GFR calc Af Amer 73 >59 mL/min/1.73   BUN/Creatinine Ratio 20 10 - 24   Sodium 139 134 - 144 mmol/L   Potassium 4.4 3.5 - 5.2 mmol/L   Chloride 102 96 - 106 mmol/L   CO2 23 20 - 29 mmol/L   Calcium 9.3 8.6 - 10.2 mg/dL   Narrative   Performed at:  [address] - [facility] [place] [address], [place], [place]  [id number] Lab Director: Mila HomerWilliam F Hancock MD, Phone:  [phone]  Urine Microscopic     Status: Abnormal   Collection Time: 05/05/17  4:33 PM  Result Value Ref Range   WBC, UA 0-5 0 - 5 /hpf   RBC, UA 0-2 0 - 2 /hpf   Epithelial Cells (non renal) None seen 0 - 10 /hpf   Casts None seen None seen /lpf   Crystals Present (A) N/A   Crystal Type Calcium Oxalate N/A   Mucus, UA Present Not Estab.   Bacteria, UA Few None seen/Few   Narrative   Performed at:  [address] - [facility] [place] [address], [place], [place]  [id number] Lab Director: Mila HomerWilliam F Hancock MD, Phone:  [phone]  POCT urinalysis dipstick     Status: Abnormal   Collection Time: 05/05/17  7:29 PM  Result Value Ref Range   Color, UA yellow  yellow   Clarity, UA clear clear   Glucose, UA negative negative mg/dL   Bilirubin, UA negative negative   Ketones, POC UA trace (5) (A) negative mg/dL   Spec Grav, UA 1.610 9.604 - 1.025   Blood, UA trace-lysed (A) negative   pH, UA 5.5 5.0 - 8.0   Protein Ur, POC negative negative mg/dL   Urobilinogen, UA 0.2 0.2 or 1.0 E.U./dL   Nitrite, UA Negative Negative   Leukocytes, UA Negative Negative    Assessment and Plan :   1. Dysuria 2. History of prostatitis 3. Elevated serum creatinine - Labs pending, patient is improving. He prefers to hold off on CT abdomen and pelvis to look into his pelvic and LLQ pain. Will f/u with  labs. Return-to-clinic precautions discussed, patient verbalized understanding.   Wallis Bamberg, PA-C [facility] [phone] 05/05/2017 3:58 PM

## 2017-05-06 LAB — URINALYSIS, MICROSCOPIC ONLY
Casts: NONE SEEN /lpf
EPITHELIAL CELLS (NON RENAL): NONE SEEN /HPF (ref 0–10)

## 2017-05-06 LAB — BASIC METABOLIC PANEL
BUN / CREAT RATIO: 20 (ref 10–24)
BUN: 25 mg/dL (ref 8–27)
CHLORIDE: 102 mmol/L (ref 96–106)
CO2: 23 mmol/L (ref 20–29)
CREATININE: 1.24 mg/dL (ref 0.76–1.27)
Calcium: 9.3 mg/dL (ref 8.6–10.2)
GFR calc non Af Amer: 63 mL/min/{1.73_m2} (ref >59)
GFR, EST AFRICAN AMERICAN: 73 mL/min/{1.73_m2} (ref >59)
GLUCOSE: 100 mg/dL — AB (ref 65–99)
Potassium: 4.4 mmol/L (ref 3.5–5.2)
SODIUM: 139 mmol/L (ref 134–144)

## 2017-05-17 ENCOUNTER — Ambulatory Visit (INDEPENDENT_AMBULATORY_CARE_PROVIDER_SITE_OTHER): Payer: Medicare HMO | Admitting: Urgent Care

## 2017-05-17 ENCOUNTER — Encounter: Payer: Self-pay | Admitting: Urgent Care

## 2017-05-17 VITALS — BP 100/61 | HR 82 | Temp 97.6°F | Resp 17 | Ht 71.0 in | Wt 162.0 lb

## 2017-05-17 DIAGNOSIS — R300 Dysuria: Secondary | ICD-10-CM

## 2017-05-17 DIAGNOSIS — N411 Chronic prostatitis: Secondary | ICD-10-CM | POA: Diagnosis not present

## 2017-05-17 DIAGNOSIS — Z8719 Personal history of other diseases of the digestive system: Secondary | ICD-10-CM

## 2017-05-17 LAB — POCT URINALYSIS DIP (MANUAL ENTRY)
Bilirubin, UA: NEGATIVE
GLUCOSE UA: NEGATIVE mg/dL
Ketones, POC UA: NEGATIVE mg/dL
Leukocytes, UA: NEGATIVE
NITRITE UA: NEGATIVE
Protein Ur, POC: NEGATIVE mg/dL
Urobilinogen, UA: 1 E.U./dL
pH, UA: 6 (ref 5.0–8.0)

## 2017-05-17 LAB — POCT CBC
Granulocyte percent: 47.6 %G (ref 37–80)
HEMATOCRIT: 40.6 % — AB (ref 43.5–53.7)
Hemoglobin: 14.3 g/dL (ref 14.1–18.1)
LYMPH, POC: 2 (ref 0.6–3.4)
MCH, POC: 32.1 pg — AB (ref 27–31.2)
MCHC: 35.2 g/dL (ref 31.8–35.4)
MCV: 91.3 fL (ref 80–97)
MID (cbc): 0.3 (ref 0–0.9)
MPV: 7.8 fL (ref 0–99.8)
POC GRANULOCYTE: 2.1 (ref 2–6.9)
POC LYMPH PERCENT: 46 %L (ref 10–50)
POC MID %: 6.4 %M (ref 0–12)
Platelet Count, POC: 190 10*3/uL (ref 142–424)
RBC: 4.45 M/uL — AB (ref 4.69–6.13)
RDW, POC: 12.4 %
WBC: 4.4 10*3/uL — AB (ref 4.6–10.2)

## 2017-05-17 NOTE — Patient Instructions (Addendum)
Dysuria Dysuria is pain or discomfort while urinating. The pain or discomfort may be felt in the tube that carries urine out of the bladder (urethra) or in the surrounding tissue of the genitals. The pain may also be felt in the groin area, lower abdomen, and lower back. You may have to urinate frequently or have the sudden feeling that you have to urinate (urgency). Dysuria can affect both men and women, but is more common in women. Dysuria can be caused by many different things, including:  Urinary tract infection in women.  Infection of the kidney or bladder.  Kidney stones or bladder stones.  Certain sexually transmitted infections (STIs), such as chlamydia.  Dehydration.  Inflammation of the vagina.  Use of certain medicines.  Use of certain soaps or scented products that cause irritation.  Follow these instructions at home: Watch your dysuria for any changes. The following actions may help to reduce any discomfort you are feeling:  Drink enough fluid to keep your urine clear or pale yellow.  Empty your bladder often. Avoid holding urine for long periods of time.  After a bowel movement or urination, women should cleanse from front to back, using each tissue only once.  Empty your bladder after sexual intercourse.  Take medicines only as directed by your health care provider.  If you were prescribed an antibiotic medicine, finish it all even if you start to feel better.  Avoid caffeine, tea, and alcohol. They can irritate the bladder and make dysuria worse. In men, alcohol may irritate the prostate.  Keep all follow-up visits as directed by your health care provider. This is important.  If you had any tests done to find the cause of dysuria, it is your responsibility to obtain your test results. Ask the lab or department performing the test when and how you will get your results. Talk with your health care provider if you have any questions about your results.  Contact a  health care provider if:  You develop pain in your back or sides.  You have a fever.  You have nausea or vomiting.  You have blood in your urine.  You are not urinating as often as you usually do. Get help right away if:  You pain is severe and not relieved with medicines.  You are unable to hold down any fluids.  You or someone else notices a change in your mental function.  You have a rapid heartbeat at rest.  You have shaking or chills.  You feel extremely weak. This information is not intended to replace advice given to you by your health care provider. Make sure you discuss any questions you have with your health care provider. Document Released: 08/06/2004 Document Revised: 04/15/2016 Document Reviewed: 07/04/2014 Elsevier Interactive Patient Education  2018 [employer]     IF you received an x-ray today, you will receive an invoice from [facility]. Please contact  Radiology at [phone] with questions or concerns regarding your invoice.   IF you received labwork today, you will receive an invoice from [facility]. Please contact [facility] at [phone] with questions or concerns regarding your invoice.   Our billing staff will not be able to assist you with questions regarding bills from these companies.  You will be contacted with the lab results as soon as they are available. The fastest way to get your results is to activate your My Chart account. Instructions are located on the last page of this paperwork. If you have not heard from   us regarding the results in 2 weeks, please contact this office.      

## 2017-05-17 NOTE — Progress Notes (Signed)
MRN: [id number] DOB: 06/24/1956  Subjective:   Richard Velazquez is a 61 y.o. male presenting for follow up on recurrent prostatitis. Last OV was 05/05/2017, was started on Bactrim. He has had steady improvement in most of his symptoms but dysuria has persisted. Today, patient reports the dysuria, slight burning sensation, post-micturition dribble, weakened urinary flow. Also still has lower abdomen/pelvic discomfort, mixed constipation and loose stools. He is taking a probiotic. Denies fever, n/v, hematuria, urinary frequency. Has history of diverticulitis, denies bloody stools.  Richard Velazquez has a current medication list which includes the following prescription(s): vitamin c, bupropion, cyanocobalamin, diphenoxylate-atropine, fish oil-omega-3 fatty acids, lamivudine, lisinopril-hydrochlorothiazide, lorazepam, meloxicam, silodosin, sulfamethoxazole-trimethoprim, tenofovir alafenamide fumarate, and vitamin d (ergocalciferol). Also is allergic to androgel [testosterone]; clindamycin/lincomycin; levaquin [levofloxacin in d5w]; trazodone and nefazodone; and sildenafil. Richard Velazquez  has a past medical history of Diverticulitis; HIV infection (HCC); and Hypertension. Also denies past surgical history.  Objective:   Vitals: BP 100/61   Pulse 82   Temp 97.6 F (36.4 C) (Oral)   Resp 17   Ht 5\' 11"  (1.803 m)   Wt 162 lb (73.5 kg)   SpO2 98%   BMI 22.59 kg/m   Physical Exam  Constitutional: He is oriented to person, place, and time. He appears well-developed and well-nourished.  Cardiovascular: Normal rate, regular rhythm and intact distal pulses.  Exam reveals no gallop and no friction rub.   No murmur heard. Pulmonary/Chest: No respiratory distress. He has no wheezes. He has no rales.  Abdominal: Soft. Bowel sounds are normal. He exhibits no distension and no mass. There is tenderness (lower pelvic). There is no guarding.  No CVA tenderness.  Neurological: He is alert and oriented to person, place,  and time.  Skin: Skin is warm and dry.   Results for orders placed or performed in visit on 05/17/17 (from the past 24 hour(s))  POCT CBC     Status: Abnormal   Collection Time: 05/17/17  9:53 AM  Result Value Ref Range   WBC 4.4 (A) 4.6 - 10.2 K/uL   Lymph, poc 2.0 0.6 - 3.4   POC LYMPH PERCENT 46.0 10 - 50 %L   MID (cbc) 0.3 0 - 0.9   POC MID % 6.4 0 - 12 %M   POC Granulocyte 2.1 2 - 6.9   Granulocyte percent 47.6 37 - 80 %G   RBC 4.45 (A) 4.69 - 6.13 M/uL   Hemoglobin 14.3 14.1 - 18.1 g/dL   HCT, POC 40.940.6 (A) 81.143.5 - 53.7 %   MCV 91.3 80 - 97 fL   MCH, POC 32.1 (A) 27 - 31.2 pg   MCHC 35.2 31.8 - 35.4 g/dL   RDW, POC 91.412.4 %   Platelet Count, POC 190 142 - 424 K/uL   MPV 7.8 0 - 99.8 fL  POCT urinalysis dipstick     Status: Abnormal   Collection Time: 05/17/17  9:54 AM  Result Value Ref Range   Color, UA yellow yellow   Clarity, UA clear clear   Glucose, UA negative negative mg/dL   Bilirubin, UA negative negative   Ketones, POC UA negative negative mg/dL   Spec Grav, UA >=7.829>=1.030 (A) 1.010 - 1.025   Blood, UA small (A) negative   pH, UA 6.0 5.0 - 8.0   Protein Ur, POC negative negative mg/dL   Urobilinogen, UA 1.0 0.2 or 1.0 E.U./dL   Nitrite, UA Negative Negative   Leukocytes, UA Negative Negative   Assessment and Plan :  1. Chronic prostatitis 2. Dysuria - I emphasized adequate hydration to patient, given crystals from his last urine micro. Continue Bactrim for now. Will refer back to [facility] for recheck. - POCT urinalysis dipstick - POCT CBC - Ambulatory referral to Urology  3. History of diverticulitis - Will monitor symptoms.  Wallis Bamberg, PA-C [facility] [phone] 05/17/2017 9:25 AM

## 2017-06-15 DIAGNOSIS — N411 Chronic prostatitis: Secondary | ICD-10-CM | POA: Diagnosis not present

## 2017-06-15 DIAGNOSIS — R351 Nocturia: Secondary | ICD-10-CM | POA: Diagnosis not present

## 2017-06-15 DIAGNOSIS — N401 Enlarged prostate with lower urinary tract symptoms: Secondary | ICD-10-CM | POA: Diagnosis not present

## 2017-07-04 DIAGNOSIS — N411 Chronic prostatitis: Secondary | ICD-10-CM

## 2017-07-21 MED ORDER — VEMLIDY 25 MG TABLET
ORAL_TABLET | 0 refills | 0 days | Status: CP
Start: 2017-07-21 — End: 2017-08-16

## 2017-08-17 MED ORDER — TENOFOVIR ALAFENAMIDE 25 MG TABLET
ORAL_TABLET | Freq: Every day | ORAL | 10 refills | 0 days | Status: CP
Start: 2017-08-17 — End: 2017-08-24

## 2017-08-25 MED ORDER — TENOFOVIR ALAFENAMIDE 25 MG TABLET
ORAL_TABLET | Freq: Every day | ORAL | 10 refills | 0 days | Status: CP
Start: 2017-08-25 — End: 2017-08-25

## 2017-08-26 MED ORDER — TENOFOVIR ALAFENAMIDE 25 MG TABLET
ORAL_TABLET | Freq: Every day | ORAL | 10 refills | 0.00000 days | Status: CP
Start: 2017-08-26 — End: 2018-07-28

## 2017-09-12 DIAGNOSIS — R69 Illness, unspecified: Secondary | ICD-10-CM | POA: Diagnosis not present

## 2017-09-22 ENCOUNTER — Ambulatory Visit
Admission: RE | Admit: 2017-09-22 | Discharge: 2017-09-22 | Disposition: A | Discharge status: Home / Self Care | Payer: MEDICARE | Attending: Infectious Disease | Admitting: Infectious Disease

## 2017-09-22 DIAGNOSIS — N419 Inflammatory disease of prostate, unspecified: Secondary | ICD-10-CM | POA: Diagnosis not present

## 2017-09-22 DIAGNOSIS — M19041 Primary osteoarthritis, right hand: Secondary | ICD-10-CM | POA: Diagnosis not present

## 2017-09-22 DIAGNOSIS — M542 Cervicalgia: Secondary | ICD-10-CM | POA: Diagnosis not present

## 2017-09-22 DIAGNOSIS — K5792 Diverticulitis of intestine, part unspecified, without perforation or abscess without bleeding: Secondary | ICD-10-CM | POA: Diagnosis not present

## 2017-09-22 DIAGNOSIS — R350 Frequency of micturition: Secondary | ICD-10-CM | POA: Diagnosis not present

## 2017-09-22 DIAGNOSIS — R413 Other amnesia: Secondary | ICD-10-CM | POA: Diagnosis not present

## 2017-09-22 DIAGNOSIS — R69 Illness, unspecified: Secondary | ICD-10-CM | POA: Diagnosis not present

## 2017-09-22 DIAGNOSIS — R5383 Other fatigue: Secondary | ICD-10-CM | POA: Diagnosis not present

## 2017-09-22 DIAGNOSIS — G479 Sleep disorder, unspecified: Secondary | ICD-10-CM | POA: Diagnosis not present

## 2017-09-22 DIAGNOSIS — I10 Essential (primary) hypertension: Secondary | ICD-10-CM | POA: Diagnosis not present

## 2017-09-22 DIAGNOSIS — M48061 Spinal stenosis, lumbar region without neurogenic claudication: Secondary | ICD-10-CM | POA: Diagnosis not present

## 2017-09-22 DIAGNOSIS — B20 Human immunodeficiency virus [HIV] disease: Principal | ICD-10-CM

## 2017-09-22 DIAGNOSIS — F3289 Other specified depressive episodes: Secondary | ICD-10-CM

## 2017-09-22 DIAGNOSIS — F419 Anxiety disorder, unspecified: Secondary | ICD-10-CM

## 2017-09-22 MED ORDER — LORAZEPAM 1 MG TABLET
ORAL_TABLET | Freq: Every day | ORAL | 5 refills | 0 days | Status: CP
Start: 2017-09-22 — End: 2018-10-05

## 2017-09-22 MED ORDER — ATAZANAVIR 300 MG-COBICISTAT 150 MG TABLET
ORAL_TABLET | Freq: Every day | ORAL | 11 refills | 0 days | Status: CP
Start: 2017-09-22 — End: 2018-09-11

## 2017-11-04 DIAGNOSIS — H43811 Vitreous degeneration, right eye: Secondary | ICD-10-CM | POA: Diagnosis not present

## 2017-11-04 DIAGNOSIS — H01025 Squamous blepharitis left lower eyelid: Secondary | ICD-10-CM | POA: Diagnosis not present

## 2017-11-04 DIAGNOSIS — H01021 Squamous blepharitis right upper eyelid: Secondary | ICD-10-CM | POA: Diagnosis not present

## 2017-11-04 DIAGNOSIS — H01024 Squamous blepharitis left upper eyelid: Secondary | ICD-10-CM | POA: Diagnosis not present

## 2017-11-04 DIAGNOSIS — H2513 Age-related nuclear cataract, bilateral: Secondary | ICD-10-CM | POA: Diagnosis not present

## 2017-11-04 DIAGNOSIS — H01022 Squamous blepharitis right lower eyelid: Secondary | ICD-10-CM | POA: Diagnosis not present

## 2018-01-02 DIAGNOSIS — R69 Illness, unspecified: Secondary | ICD-10-CM | POA: Diagnosis not present

## 2018-01-28 DIAGNOSIS — K5732 Diverticulitis of large intestine without perforation or abscess without bleeding: Secondary | ICD-10-CM | POA: Diagnosis not present

## 2018-01-31 DIAGNOSIS — H01025 Squamous blepharitis left lower eyelid: Secondary | ICD-10-CM | POA: Diagnosis not present

## 2018-01-31 DIAGNOSIS — H43811 Vitreous degeneration, right eye: Secondary | ICD-10-CM | POA: Diagnosis not present

## 2018-01-31 DIAGNOSIS — H01024 Squamous blepharitis left upper eyelid: Secondary | ICD-10-CM | POA: Diagnosis not present

## 2018-01-31 DIAGNOSIS — H2513 Age-related nuclear cataract, bilateral: Secondary | ICD-10-CM | POA: Diagnosis not present

## 2018-01-31 DIAGNOSIS — H01021 Squamous blepharitis right upper eyelid: Secondary | ICD-10-CM | POA: Diagnosis not present

## 2018-01-31 DIAGNOSIS — H01022 Squamous blepharitis right lower eyelid: Secondary | ICD-10-CM | POA: Diagnosis not present

## 2018-03-30 ENCOUNTER — Ambulatory Visit
Admit: 2018-03-30 | Discharge: 2018-03-30 | Payer: MEDICARE | Attending: Infectious Disease | Primary: Infectious Disease

## 2018-03-30 DIAGNOSIS — R5382 Chronic fatigue, unspecified: Secondary | ICD-10-CM | POA: Diagnosis not present

## 2018-03-30 DIAGNOSIS — M19042 Primary osteoarthritis, left hand: Secondary | ICD-10-CM | POA: Diagnosis not present

## 2018-03-30 DIAGNOSIS — R69 Illness, unspecified: Secondary | ICD-10-CM | POA: Diagnosis not present

## 2018-03-30 DIAGNOSIS — R413 Other amnesia: Secondary | ICD-10-CM | POA: Diagnosis not present

## 2018-03-30 DIAGNOSIS — R799 Abnormal finding of blood chemistry, unspecified: Secondary | ICD-10-CM | POA: Diagnosis not present

## 2018-03-30 DIAGNOSIS — M19041 Primary osteoarthritis, right hand: Secondary | ICD-10-CM | POA: Diagnosis not present

## 2018-03-30 DIAGNOSIS — B20 Human immunodeficiency virus [HIV] disease: Principal | ICD-10-CM

## 2018-03-30 DIAGNOSIS — F419 Anxiety disorder, unspecified: Secondary | ICD-10-CM

## 2018-03-30 DIAGNOSIS — F3289 Other specified depressive episodes: Secondary | ICD-10-CM

## 2018-03-30 MED ORDER — CLOBETASOL 0.05 % TOPICAL OINTMENT
Freq: Two times a day (BID) | TOPICAL | 1 refills | 0 days | Status: CP
Start: 2018-03-30 — End: 2019-03-30

## 2018-03-30 MED ORDER — DULOXETINE 20 MG CAPSULE,DELAYED RELEASE
ORAL_CAPSULE | Freq: Every day | ORAL | 6 refills | 0.00000 days | Status: CP
Start: 2018-03-30 — End: 2018-10-05

## 2018-03-30 MED ORDER — NEOMYCIN-POLYMYXIN-HYDROCORTISONE OTIC SOLN T-HOME
Freq: Four times a day (QID) | OTIC | 3 refills | 0 days | Status: CP
Start: 2018-03-30 — End: 2018-04-09

## 2018-04-13 MED ORDER — LAMIVUDINE 300 MG TABLET
ORAL_TABLET | Freq: Every day | ORAL | 3 refills | 0 days | Status: CP
Start: 2018-04-13 — End: 2019-04-05

## 2018-05-03 DIAGNOSIS — H01025 Squamous blepharitis left lower eyelid: Secondary | ICD-10-CM | POA: Diagnosis not present

## 2018-05-03 DIAGNOSIS — H2513 Age-related nuclear cataract, bilateral: Secondary | ICD-10-CM | POA: Diagnosis not present

## 2018-05-03 DIAGNOSIS — H01024 Squamous blepharitis left upper eyelid: Secondary | ICD-10-CM | POA: Diagnosis not present

## 2018-05-03 DIAGNOSIS — H43811 Vitreous degeneration, right eye: Secondary | ICD-10-CM | POA: Diagnosis not present

## 2018-05-03 DIAGNOSIS — H01022 Squamous blepharitis right lower eyelid: Secondary | ICD-10-CM | POA: Diagnosis not present

## 2018-05-03 DIAGNOSIS — H01021 Squamous blepharitis right upper eyelid: Secondary | ICD-10-CM | POA: Diagnosis not present

## 2018-05-08 MED ORDER — BUPROPION HCL SR 150 MG TABLET,12 HR SUSTAINED-RELEASE
ORAL_TABLET | Freq: Two times a day (BID) | ORAL | 3 refills | 0.00000 days | Status: CP
Start: 2018-05-08 — End: 2019-04-05

## 2018-05-16 ENCOUNTER — Ambulatory Visit: Payer: Medicare HMO | Admitting: Family Medicine

## 2018-05-16 ENCOUNTER — Other Ambulatory Visit: Payer: Self-pay

## 2018-05-16 ENCOUNTER — Encounter: Payer: Self-pay | Admitting: Family Medicine

## 2018-05-16 VITALS — BP 110/82 | HR 88 | Temp 98.0°F | Ht 70.5 in | Wt 161.0 lb

## 2018-05-16 DIAGNOSIS — R1032 Left lower quadrant pain: Secondary | ICD-10-CM | POA: Diagnosis not present

## 2018-05-16 DIAGNOSIS — R1084 Generalized abdominal pain: Secondary | ICD-10-CM | POA: Diagnosis not present

## 2018-05-16 DIAGNOSIS — R350 Frequency of micturition: Secondary | ICD-10-CM | POA: Diagnosis not present

## 2018-05-16 DIAGNOSIS — Z8719 Personal history of other diseases of the digestive system: Secondary | ICD-10-CM

## 2018-05-16 LAB — POCT CBC
GRANULOCYTE PERCENT: 76.2 % (ref 37–80)
HEMATOCRIT: 47.3 % (ref 43.5–53.7)
HEMOGLOBIN: 15.7 g/dL (ref 14.1–18.1)
Lymph, poc: 0.9 (ref 0.6–3.4)
MCH: 30.8 pg (ref 27–31.2)
MCHC: 33.1 g/dL (ref 31.8–35.4)
MCV: 93 fL (ref 80–97)
MID (CBC): 1.2 — AB (ref 0–0.9)
MPV: 8.5 fL (ref 0–99.8)
POC GRANULOCYTE: 6.7 (ref 2–6.9)
POC LYMPH PERCENT: 10.1 %L (ref 10–50)
POC MID %: 13.7 % — AB (ref 0–12)
Platelet Count, POC: 194 10*3/uL (ref 142–424)
RBC: 5.09 M/uL (ref 4.69–6.13)
RDW, POC: 12.6 %
WBC: 8.8 10*3/uL (ref 4.6–10.2)

## 2018-05-16 LAB — POCT URINALYSIS DIP (MANUAL ENTRY)
Bilirubin, UA: NEGATIVE
GLUCOSE UA: NEGATIVE mg/dL
Ketones, POC UA: NEGATIVE mg/dL
LEUKOCYTES UA: NEGATIVE
Nitrite, UA: NEGATIVE
PROTEIN UA: NEGATIVE mg/dL
Spec Grav, UA: >=1.03 — AB (ref 1.010–1.025)
Urobilinogen, UA: 1 E.U./dL
pH, UA: 6 (ref 5.0–8.0)

## 2018-05-16 MED ORDER — AMOXICILLIN-POT CLAVULANATE 875-125 MG PO TABS: 1.0000 | ORAL_TABLET | Freq: Two times a day (BID) | ORAL | 0 refills | Status: DC

## 2018-05-16 NOTE — Progress Notes (Signed)
Subjective:  By signing my name below, I, Richard Velazquez, attest that this documentation has been prepared under the direction and in the presence of [name], MD. Electronically Signed: Stann Velazquez, Scribe. 05/16/2018 , 6:36 PM .  Patient was seen in Room 2 .   Patient ID: Richard Velazquez, male    DOB: 1956-06-18, 62 y.o.   MRN: [id number] Chief Complaint  Patient presents with  . Abdominal Pain    along with sorness in colon area Started Sunday night   HPI Richard Velazquez is a 62 y.o. male Here with abdominal pain with concern for diverticulitis. His GI is Dr. Elnoria Howard. Note reviewed from GI from 07/19/16 with negative Sept 2007 colonoscopy. Note reviewed from urologist visit May 2017 for long standing progressive voiding symptoms; prostatitis in 2017, treated with antibiotics. He had elevations of PSA up to 5.2.   Lab Results  Component Value Date   PSA 44.93 (H) 01/01/2016   PSA 2.96 12/30/2013   PSA 5.24 (H) 11/13/2013   His PSA was significantly elevated as above in Feb 2017. He was being treated with bactrim at that time. Appears he was treated for chronic prostatitis in June 2018. Plan for [facility] follow up and continued on bactrim. Of note, he did note concern of diverticulitis in 2014 when treated for prostatitis; abdominal CT discussed in 2014 but declined at that time. He was seen by urology on 06/15/17 for history of chronic prostatitis, question of most recent urinalysis with bacterial prostatitis; clear urinalysis at that time. His PSA was 3.05 on 03/22/17. He is a previous patient of Dr. Ellis Parents. He came to the [place] in 1991.   Abdominal pain Patient states his abdominal pain started 2 nights ago, notes going to a BBQ that day with some friends. But, he had concerns due to previous abdominal pain with worsening pain. He usually watches what he eats when abdominal pain occurs for improvement, but he feels this pain is beyond his home remedies this time. He describes  location of pain over LLQ, and radiating into his left back. He denies associated fever, vomiting, nausea or diarrhea. At night, he has the sensation of needing to defecate; then first thing in the morning, he goes for a walk, and upon returning, he would use the bathroom. He denies taking OTC medications for his abdominal pain. He was seen at the ER 5 months ago, and pain was improved after 8 hours starting antibiotics; unsure which antibiotic it was.   He also noticed more urinary frequency recently too. He denies history of kidney stones. He denies hematuria. He does have an appointment with his urologist on Thursday.   He's felt a little lightheaded; his BP was a little low on triage today. He has a BP machine at home to check his BP.   Patient Active Problem List   Diagnosis Date Noted  . Anxiety 05/08/2015  . Essential (primary) hypertension 09/19/2014  . Arthritis of hand, degenerative 09/19/2014  . Amnesia 03/01/2014  . HIV disease (HCC) 11/04/2013  . Diverticulitis 09/20/2013  . Fatigue 09/20/2013  . Prostatitis 09/20/2013  . Carpal tunnel syndrome 03/01/2013  . Clinical depression 03/01/2013  . Lumbar disc disease with radiculopathy 03/01/2013   Past Medical History:  Diagnosis Date  . Diverticulitis   . HIV infection (HCC)   . Hypertension    No past surgical history on file. Allergies  Allergen Reactions  . Androgel [Testosterone] Anaphylaxis  . Clindamycin/Lincomycin Other (See Comments)  Tendonitis   . Levaquin [Levofloxacin In D5w] Other (See Comments)    Tendonitis    . Cymbalta [Duloxetine Hcl] Other (See Comments)    fatigue  . Trazodone And Nefazodone Other (See Comments)    prioprism   . Sildenafil Rash    Anxiety, palpitations, no effect of drug   Prior to Admission medications   Medication Sig Start Date End Date Taking? Authorizing Provider  Ascorbic Acid (VITAMIN C) 100 MG tablet Take 500 mg by mouth daily.     [provider]    buPROPion (WELLBUTRIN SR) 150 MG 12 hr tablet Take 150 mg by mouth 2 (two) times daily.    [provider]  cyanocobalamin 2000 MCG tablet Take 2,000 mcg by mouth daily.    [provider]  diphenoxylate-atropine (LOMOTIL) 2.5-0.025 MG/5ML liquid Take 5 mLs by mouth 4 (four) times daily as needed.    [provider]  fish oil-omega-3 fatty acids 1000 MG capsule Take 2 g by mouth daily.    [provider]  lamivudine (EPIVIR) 300 MG tablet Take 300 mg by mouth daily.    [provider]  lisinopril-hydrochlorothiazide (PRINZIDE,ZESTORETIC) 10-12.5 MG per tablet Take 1 tablet by mouth daily.    [provider]  LORazepam (ATIVAN) 0.5 MG tablet Take 0.5 mg by mouth every 8 (eight) hours.    [provider]  meloxicam (MOBIC) 15 MG tablet Take 15 mg by mouth daily. 12/22/15   [provider]  silodosin (RAPAFLO) 8 MG CAPS capsule Take 8 mg by mouth 2 (two) times daily.    [provider]  sulfamethoxazole-trimethoprim (BACTRIM DS,SEPTRA DS) 800-160 MG tablet Take 1 tablet by mouth 2 (two) times daily. 04/29/17   Wallis BambergMani, Mario, PA-C  Tenofovir Alafenamide Fumarate (VEMLIDY) 25 MG TABS Take by mouth.    [provider]  Vitamin D, Ergocalciferol, (DRISDOL) 50000 UNITS CAPS Take 50,000 Units by mouth.    [provider]   Social History   Socioeconomic History  . Marital status: Single    Spouse name: Not on file  . Number of children: Not on file  . Years of education: Not on file  . Highest education level: Not on file  Occupational History  . Not on file  Social Needs  . Financial resource strain: Not on file  . Food insecurity:    Worry: Not on file    Inability: Not on file  . Transportation needs:    Medical: Not on file    Non-medical: Not on file  Tobacco Use  . Smoking status: Never Smoker  . Smokeless tobacco: Never Used  Substance and Sexual Activity  . Alcohol use: Yes  . Drug use:  No  . Sexual activity: Yes  Lifestyle  . Physical activity:    Days per week: Not on file    Minutes per session: Not on file  . Stress: Not on file  Relationships  . Social connections:    Talks on phone: Not on file    Gets together: Not on file    Attends religious service: Not on file    Active member of club or organization: Not on file    Attends meetings of clubs or organizations: Not on file    Relationship status: Not on file  . Intimate partner violence:    Fear of current or ex partner: Not on file    Emotionally abused: Not on file    Physically abused: Not on file  Forced sexual activity: Not on file  Other Topics Concern  . Not on file  Social History Narrative  . Not on file   Review of Systems  Constitutional: Negative for fatigue and unexpected weight change.  Eyes: Negative for visual disturbance.  Respiratory: Negative for cough, chest tightness and shortness of breath.   Cardiovascular: Negative for chest pain, palpitations and leg swelling.  Gastrointestinal: Positive for abdominal pain. Negative for blood in stool.  Genitourinary: Positive for frequency. Negative for hematuria.  Neurological: Positive for light-headedness. Negative for dizziness and headaches.       Objective:   Physical Exam  Constitutional: He is oriented to person, place, and time. He appears well-developed and well-nourished. No distress.  HENT:  Head: Normocephalic and atraumatic.  Eyes: Pupils are equal, round, and reactive to light. EOM are normal.  Neck: Neck supple.  Cardiovascular: Normal rate, regular rhythm and normal heart sounds. Exam reveals no gallop and no friction rub.  No murmur heard. Pulmonary/Chest: Effort normal and breath sounds normal. No respiratory distress.  Abdominal: Soft. He exhibits no distension. There is tenderness (primarily LLQ) in the suprapubic area and left lower quadrant. There is no rebound, no guarding, no CVA tenderness, no tenderness at  McBurney's point and negative Murphy's sign.  Musculoskeletal: Normal range of motion.  Neurological: He is alert and oriented to person, place, and time.  Skin: Skin is warm and dry.  Psychiatric: He has a normal mood and affect. His behavior is normal.  Nursing note and vitals reviewed.   Vitals:   05/16/18 1712 05/16/18 1722  BP: 92/61 110/82  Pulse: 88   Temp: 98 F (36.7 C)   TempSrc: Oral   SpO2: 96%   Weight: 161 lb (73 kg)   Height: 5' 10.5" (1.791 m)    Results for orders placed or performed in visit on 05/16/18  POCT urinalysis dipstick  Result Value Ref Range   Color, UA yellow yellow   Clarity, UA clear clear   Glucose, UA negative negative mg/dL   Bilirubin, UA negative negative   Ketones, POC UA negative negative mg/dL   Spec Grav, UA >=1.610 (A) 1.010 - 1.025   Blood, UA trace-intact (A) negative   pH, UA 6.0 5.0 - 8.0   Protein Ur, POC negative negative mg/dL   Urobilinogen, UA 1.0 0.2 or 1.0 E.U./dL   Nitrite, UA Negative Negative   Leukocytes, UA Negative Negative  POCT CBC  Result Value Ref Range   WBC 8.8 4.6 - 10.2 K/uL   Lymph, poc 0.9 0.6 - 3.4   POC LYMPH PERCENT 10.1 10 - 50 %L   MID (cbc) 1.2 (A) 0 - 0.9   POC MID % 13.7 (A) 0 - 12 %M   POC Granulocyte 6.7 2 - 6.9   Granulocyte percent 76.2 37 - 80 %G   RBC 5.09 4.69 - 6.13 M/uL   Hemoglobin 15.7 14.1 - 18.1 g/dL   HCT, POC 96.0 45.4 - 53.7 %   MCV 93.0 80 - 97 fL   MCH, POC 30.8 27 - 31.2 pg   MCHC 33.1 31.8 - 35.4 g/dL   RDW, POC 09.8 %   Platelet Count, POC 194 142 - 424 K/uL   MPV 8.5 0 - 99.8 fL        Assessment & Plan:   NAMEER SUMMER is a 62 y.o. male Generalized abdominal pain - Plan: POCT urinalysis dipstick, POCT CBC  History of diverticulitis - Plan: amoxicillin-clavulanate (AUGMENTIN) 875-125  MG tablet, CANCELED: CT Abdomen Pelvis W Contrast  LLQ abdominal pain - Plan: amoxicillin-clavulanate (AUGMENTIN) 875-125 MG tablet, CANCELED: CT Abdomen Pelvis W  Contrast  Urinary frequency - Plan: Urine Culture, PSA  Left lower quadrant pain - Plan: CT Abdomen Pelvis W Contrast, CANCELED: CT Abdomen Pelvis W Contrast  Medical history reviewed, history of prostatitis as well as diverticulitis.  Current exam and symptoms suspicious for early diverticulitis.  Does not feel like his prostatitis symptoms, but urine culture obtained with the urinary symptoms.  Differential also included sigmoid diverticulitis with associated urinary symptoms.  -Started on Augmentin given quinolone allergy with reported tendinitis previously.  Urine culture obtained.  -CT abdomen pelvis was obtained, and noted descending diverticulitis without abscess.  Plan for follow-up in 48 hours.  -Borderline low blood pressure, advised to increase fluid intake and recheck in 48 hours.   Meds ordered this encounter  Medications  . amoxicillin-clavulanate (AUGMENTIN) 875-125 MG tablet    Sig: Take 1 tablet by mouth 2 (two) times daily.    Dispense:  20 tablet    Refill:  0   Patient Instructions    Blood pressure is on the lower side today.  Make sure to drink plenty of fluids, Keep a record of your blood pressures outside of the office and bring them to the next office visit, and recheck for blood pressure and abdominal symtpoms in next 2 days.    Based on symptoms and your exam today, you could potentially have diverticulitis or less likely  repeat prostate infection.  I will check test for prostate, but for now we will treat for diverticulitis with Augmentin 1 pill twice per day.   Ffluids are more important than solid foods for now but make sure you do drink plenty of fluids as above.  We will call you to schedule CT scan tomorrow.  If any worsening pain, fever, or other worsening symptoms, proceed to the emergency room.  Otherwise follow-up with me in 2 days, and follow-up with urologist as planned at that time as well.    Diverticulitis Diverticulitis is infection or  inflammation of small pouches (diverticula) in the colon that form due to a condition called diverticulosis. Diverticula can trap stool (feces) and bacteria, causing infection and inflammation. Diverticulitis may cause severe stomach pain and diarrhea. It may lead to tissue damage in the colon that causes bleeding. The diverticula may also burst (rupture) and cause infected stool to enter other areas of the abdomen. Complications of diverticulitis can include:  Bleeding.  Severe infection.  Severe pain.  Rupture (perforation) of the colon.  Blockage (obstruction) of the colon.  What are the causes? This condition is caused by stool becoming trapped in the diverticula, which allows bacteria to grow in the diverticula. This leads to inflammation and infection. What increases the risk? You are more likely to develop this condition if:  You have diverticulosis. The risk for diverticulosis increases if: ? You are overweight or obese. ? You use tobacco products. ? You do not get enough exercise.  You eat a diet that does not include enough fiber. High-fiber foods include fruits, vegetables, beans, nuts, and whole grains.  What are the signs or symptoms? Symptoms of this condition may include:  Pain and tenderness in the abdomen. The pain is normally located on the left side of the abdomen, but it may occur in other areas.  Fever and chills.  Bloating.  Cramping.  Nausea.  Vomiting.  Changes in bowel routines.  Blood  in your stool.  How is this diagnosed? This condition is diagnosed based on:  Your medical history.  A physical exam.  Tests to make sure there is nothing else causing your condition. These tests may include: ? Blood tests. ? Urine tests. ? Imaging tests of the abdomen, including X-rays, ultrasounds, MRIs, or CT scans.  How is this treated? Most cases of this condition are mild and can be treated at home. Treatment may include:  Taking  over-the-counter pain medicines.  Following a clear liquid diet.  Taking antibiotic medicines by mouth.  Rest.  More severe cases may need to be treated at a hospital. Treatment may include:  Not eating or drinking.  Taking prescription pain medicine.  Receiving antibiotic medicines through an IV tube.  Receiving fluids and nutrition through an IV tube.  Surgery.  When your condition is under control, your health care provider may recommend that you have a colonoscopy. This is an exam to look at the entire large intestine. During the exam, a lubricated, bendable tube is inserted into the anus and then passed into the rectum, colon, and other parts of the large intestine. A colonoscopy can show how severe your diverticula are and whether something else may be causing your symptoms. Follow these instructions at home: Medicines  Take over-the-counter and prescription medicines only as told by your health care provider. These include fiber supplements, probiotics, and stool softeners.  If you were prescribed an antibiotic medicine, take it as told by your health care provider. Do not stop taking the antibiotic even if you start to feel better.  Do not drive or use heavy machinery while taking prescription pain medicine. General instructions  Follow a full liquid diet or another diet as directed by your health care provider. After your symptoms improve, your health care provider may tell you to change your diet. He or she may recommend that you eat a diet that contains at least 25 g (25 grams) of fiber daily. Fiber makes it easier to pass stool. Healthy sources of fiber include: ? Berries. One cup contains 4-8 grams of fiber. ? Beans or lentils. One half cup contains 5-8 grams of fiber. ? Green vegetables. One cup contains 4 grams of fiber.  Exercise for at least 30 minutes, 3 times each week. You should exercise hard enough to raise your heart rate and break a sweat.  Keep all  follow-up visits as told by your health care provider. This is important. You may need a colonoscopy. Contact a health care provider if:  Your pain does not improve.  You have a hard time drinking or eating food.  Your bowel movements do not return to normal. Get help right away if:  Your pain gets worse.  Your symptoms do not get better with treatment.  Your symptoms suddenly get worse.  You have a fever.  You vomit more than one time.  You have stools that are bloody, black, or tarry. Summary  Diverticulitis is infection or inflammation of small pouches (diverticula) in the colon that form due to a condition called diverticulosis. Diverticula can trap stool (feces) and bacteria, causing infection and inflammation.  You are at higher risk for this condition if you have diverticulosis and you eat a diet that does not include enough fiber.  Most cases of this condition are mild and can be treated at home. More severe cases may need to be treated at a hospital.  When your condition is under control, your health care provider  may recommend that you have an exam called a colonoscopy. This exam can show how severe your diverticula are and whether something else may be causing your symptoms. This information is not intended to replace advice given to you by your health care provider. Make sure you discuss any questions you have with your health care provider. Document Released: 08/18/2005 Document Revised: 12/11/2016 Document Reviewed: 12/11/2016 Elsevier Interactive Patient Education  2018 [employer]  Abdominal Pain, Adult Abdominal pain can be caused by many things. Often, abdominal pain is not serious and it gets better with no treatment or by being treated at home. However, sometimes abdominal pain is serious. Your health care provider will do a medical history and a physical exam to try to determine the cause of your abdominal pain. Follow these instructions at home:  Take  over-the-counter and prescription medicines only as told by your health care provider. Do not take a laxative unless told by your health care provider.  Drink enough fluid to keep your urine clear or pale yellow.  Watch your condition for any changes.  Keep all follow-up visits as told by your health care provider. This is important. Contact a health care provider if:  Your abdominal pain changes or gets worse.  You are not hungry or you lose weight without trying.  You are constipated or have diarrhea for more than 2-3 days.  You have pain when you urinate or have a bowel movement.  Your abdominal pain wakes you up at night.  Your pain gets worse with meals, after eating, or with certain foods.  You are throwing up and cannot keep anything down.  You have a fever. Get help right away if:  Your pain does not go away as soon as your health care provider told you to expect.  You cannot stop throwing up.  Your pain is only in areas of the abdomen, such as the right side or the left lower portion of the abdomen.  You have bloody or black stools, or stools that look like tar.  You have severe pain, cramping, or bloating in your abdomen.  You have signs of dehydration, such as: ? Dark urine, very little urine, or no urine. ? Cracked lips. ? Dry mouth. ? Sunken eyes. ? Sleepiness. ? Weakness. This information is not intended to replace advice given to you by your health care provider. Make sure you discuss any questions you have with your health care provider. Document Released: 08/18/2005 Document Revised: 05/28/2016 Document Reviewed: 04/21/2016 Elsevier Interactive Patient Education  2018 [employer].  IF you received an x-ray today, you will receive an invoice from [facility]. Please contact [facility] at [phone] with questions or concerns regarding your invoice.   IF you received labwork today, you will receive an invoice from [facility]. Please  contact [facility] at [phone] with questions or concerns regarding your invoice.   Our billing staff will not be able to assist you with questions regarding bills from these companies.  You will be contacted with the lab results as soon as they are available. The fastest way to get your results is to activate your My Chart account. Instructions are located on the last page of this paperwork. If you have not heard from [place] regarding the results in 2 weeks, please contact this office.       I personally performed the services described in this documentation, which was scribed in my presence. The recorded information has been reviewed and considered for accuracy and  completeness, addended by me as needed, and agree with information above.  Signed,   [name], MD Primary [facility] at [facility].  05/18/18 2:10 PM

## 2018-05-16 NOTE — Patient Instructions (Addendum)
Blood pressure is on the lower side today.  Make sure to drink plenty of fluids, Keep a record of your blood pressures outside of the office and bring them to the next office visit, and recheck for blood pressure and abdominal symtpoms in next 2 days.    Based on symptoms and your exam today, you could potentially have diverticulitis or less likely  repeat prostate infection.  I will check test for prostate, but for now we will treat for diverticulitis with Augmentin 1 pill twice per day.   Ffluids are more important than solid foods for now but make sure you do drink plenty of fluids as above.  We will call you to schedule CT scan tomorrow.  If any worsening pain, fever, or other worsening symptoms, proceed to the emergency room.  Otherwise follow-up with me in 2 days, and follow-up with urologist as planned at that time as well.    Diverticulitis Diverticulitis is infection or inflammation of small pouches (diverticula) in the colon that form due to a condition called diverticulosis. Diverticula can trap stool (feces) and bacteria, causing infection and inflammation. Diverticulitis may cause severe stomach pain and diarrhea. It may lead to tissue damage in the colon that causes bleeding. The diverticula may also burst (rupture) and cause infected stool to enter other areas of the abdomen. Complications of diverticulitis can include:  Bleeding.  Severe infection.  Severe pain.  Rupture (perforation) of the colon.  Blockage (obstruction) of the colon.  What are the causes? This condition is caused by stool becoming trapped in the diverticula, which allows bacteria to grow in the diverticula. This leads to inflammation and infection. What increases the risk? You are more likely to develop this condition if:  You have diverticulosis. The risk for diverticulosis increases if: ? You are overweight or obese. ? You use tobacco products. ? You do not get enough exercise.  You eat a diet  that does not include enough fiber. High-fiber foods include fruits, vegetables, beans, nuts, and whole grains.  What are the signs or symptoms? Symptoms of this condition may include:  Pain and tenderness in the abdomen. The pain is normally located on the left side of the abdomen, but it may occur in other areas.  Fever and chills.  Bloating.  Cramping.  Nausea.  Vomiting.  Changes in bowel routines.  Blood in your stool.  How is this diagnosed? This condition is diagnosed based on:  Your medical history.  A physical exam.  Tests to make sure there is nothing else causing your condition. These tests may include: ? Blood tests. ? Urine tests. ? Imaging tests of the abdomen, including X-rays, ultrasounds, MRIs, or CT scans.  How is this treated? Most cases of this condition are mild and can be treated at home. Treatment may include:  Taking over-the-counter pain medicines.  Following a clear liquid diet.  Taking antibiotic medicines by mouth.  Rest.  More severe cases may need to be treated at a hospital. Treatment may include:  Not eating or drinking.  Taking prescription pain medicine.  Receiving antibiotic medicines through an IV tube.  Receiving fluids and nutrition through an IV tube.  Surgery.  When your condition is under control, your health care provider may recommend that you have a colonoscopy. This is an exam to look at the entire large intestine. During the exam, a lubricated, bendable tube is inserted into the anus and then passed into the rectum, colon, and other parts of the large  intestine. A colonoscopy can show how severe your diverticula are and whether something else may be causing your symptoms. Follow these instructions at home: Medicines  Take over-the-counter and prescription medicines only as told by your health care provider. These include fiber supplements, probiotics, and stool softeners.  If you were prescribed an antibiotic  medicine, take it as told by your health care provider. Do not stop taking the antibiotic even if you start to feel better.  Do not drive or use heavy machinery while taking prescription pain medicine. General instructions  Follow a full liquid diet or another diet as directed by your health care provider. After your symptoms improve, your health care provider may tell you to change your diet. He or she may recommend that you eat a diet that contains at least 25 g (25 grams) of fiber daily. Fiber makes it easier to pass stool. Healthy sources of fiber include: ? Berries. One cup contains 4-8 grams of fiber. ? Beans or lentils. One half cup contains 5-8 grams of fiber. ? Green vegetables. One cup contains 4 grams of fiber.  Exercise for at least 30 minutes, 3 times each week. You should exercise hard enough to raise your heart rate and break a sweat.  Keep all follow-up visits as told by your health care provider. This is important. You may need a colonoscopy. Contact a health care provider if:  Your pain does not improve.  You have a hard time drinking or eating food.  Your bowel movements do not return to normal. Get help right away if:  Your pain gets worse.  Your symptoms do not get better with treatment.  Your symptoms suddenly get worse.  You have a fever.  You vomit more than one time.  You have stools that are bloody, black, or tarry. Summary  Diverticulitis is infection or inflammation of small pouches (diverticula) in the colon that form due to a condition called diverticulosis. Diverticula can trap stool (feces) and bacteria, causing infection and inflammation.  You are at higher risk for this condition if you have diverticulosis and you eat a diet that does not include enough fiber.  Most cases of this condition are mild and can be treated at home. More severe cases may need to be treated at a hospital.  When your condition is under control, your health care  provider may recommend that you have an exam called a colonoscopy. This exam can show how severe your diverticula are and whether something else may be causing your symptoms. This information is not intended to replace advice given to you by your health care provider. Make sure you discuss any questions you have with your health care provider. Document Released: 08/18/2005 Document Revised: 12/11/2016 Document Reviewed: 12/11/2016 Elsevier Interactive Patient Education  2018 [employer]  Abdominal Pain, Adult Abdominal pain can be caused by many things. Often, abdominal pain is not serious and it gets better with no treatment or by being treated at home. However, sometimes abdominal pain is serious. Your health care provider will do a medical history and a physical exam to try to determine the cause of your abdominal pain. Follow these instructions at home:  Take over-the-counter and prescription medicines only as told by your health care provider. Do not take a laxative unless told by your health care provider.  Drink enough fluid to keep your urine clear or pale yellow.  Watch your condition for any changes.  Keep all follow-up visits as told by your health care  provider. This is important. Contact a health care provider if:  Your abdominal pain changes or gets worse.  You are not hungry or you lose weight without trying.  You are constipated or have diarrhea for more than 2-3 days.  You have pain when you urinate or have a bowel movement.  Your abdominal pain wakes you up at night.  Your pain gets worse with meals, after eating, or with certain foods.  You are throwing up and cannot keep anything down.  You have a fever. Get help right away if:  Your pain does not go away as soon as your health care provider told you to expect.  You cannot stop throwing up.  Your pain is only in areas of the abdomen, such as the right side or the left lower portion of the abdomen.  You  have bloody or black stools, or stools that look like tar.  You have severe pain, cramping, or bloating in your abdomen.  You have signs of dehydration, such as: ? Dark urine, very little urine, or no urine. ? Cracked lips. ? Dry mouth. ? Sunken eyes. ? Sleepiness. ? Weakness. This information is not intended to replace advice given to you by your health care provider. Make sure you discuss any questions you have with your health care provider. Document Released: 08/18/2005 Document Revised: 05/28/2016 Document Reviewed: 04/21/2016 Elsevier Interactive Patient Education  2018 [employer].  IF you received an x-ray today, you will receive an invoice from [facility]. Please contact [facility] at [phone] with questions or concerns regarding your invoice.   IF you received labwork today, you will receive an invoice from [place]. Please contact [facility] at [phone] with questions or concerns regarding your invoice.   Our billing staff will not be able to assist you with questions regarding bills from these companies.  You will be contacted with the lab results as soon as they are available. The fastest way to get your results is to activate your My Chart account. Instructions are located on the last page of this paperwork. If you have not heard from [place] regarding the results in 2 weeks, please contact this office.

## 2018-05-17 ENCOUNTER — Ambulatory Visit
Admission: RE | Admit: 2018-05-17 | Discharge: 2018-05-17 | Disposition: A | Discharge status: Home / Self Care | Payer: Medicare HMO | Source: Ambulatory Visit | Attending: Family Medicine | Admitting: Family Medicine

## 2018-05-17 DIAGNOSIS — R1032 Left lower quadrant pain: Secondary | ICD-10-CM

## 2018-05-17 DIAGNOSIS — K5732 Diverticulitis of large intestine without perforation or abscess without bleeding: Secondary | ICD-10-CM | POA: Diagnosis not present

## 2018-05-17 LAB — URINE CULTURE: ORGANISM ID, BACTERIA: NO GROWTH

## 2018-05-17 LAB — PSA: PROSTATE SPECIFIC AG, SERUM: 4.7 ng/mL — AB (ref 0.0–4.0)

## 2018-05-17 MED ORDER — IOHEXOL 300 MG/ML  SOLN
100.0000 mL | Freq: Once | INTRAMUSCULAR | Status: AC | PRN
  Administered 2018-05-17: 100 mL via INTRAVENOUS

## 2018-05-18 ENCOUNTER — Encounter: Payer: Self-pay | Admitting: Family Medicine

## 2018-05-18 ENCOUNTER — Other Ambulatory Visit: Payer: Self-pay

## 2018-05-18 ENCOUNTER — Telehealth: Payer: Self-pay | Admitting: Family Medicine

## 2018-05-18 ENCOUNTER — Ambulatory Visit (INDEPENDENT_AMBULATORY_CARE_PROVIDER_SITE_OTHER): Payer: Medicare HMO | Admitting: Family Medicine

## 2018-05-18 VITALS — BP 110/70 | HR 76 | Temp 97.6°F | Ht 70.0 in | Wt 161.2 lb

## 2018-05-18 DIAGNOSIS — R351 Nocturia: Secondary | ICD-10-CM | POA: Diagnosis not present

## 2018-05-18 DIAGNOSIS — R972 Elevated prostate specific antigen [PSA]: Secondary | ICD-10-CM | POA: Diagnosis not present

## 2018-05-18 DIAGNOSIS — N401 Enlarged prostate with lower urinary tract symptoms: Secondary | ICD-10-CM | POA: Diagnosis not present

## 2018-05-18 DIAGNOSIS — K5732 Diverticulitis of large intestine without perforation or abscess without bleeding: Secondary | ICD-10-CM | POA: Diagnosis not present

## 2018-05-18 DIAGNOSIS — R3912 Poor urinary stream: Secondary | ICD-10-CM | POA: Diagnosis not present

## 2018-05-18 NOTE — Telephone Encounter (Signed)
[facility] approved CT abdomin and pelvis, with contrast  Auth#: [id number] Reference#: [id number] Valid 05/17/18-08/15/18

## 2018-05-18 NOTE — Patient Instructions (Addendum)
Slowly advance diet as pain improves. Fluids most important for now. Call your gastroenterologist to schedule appointment to discuss other recommendations from here forward.  Follow up to discuss other medical history and plan for [place] travel in the next few months.    Diverticulitis Diverticulitis is infection or inflammation of small pouches (diverticula) in the colon that form due to a condition called diverticulosis. Diverticula can trap stool (feces) and bacteria, causing infection and inflammation. Diverticulitis may cause severe stomach pain and diarrhea. It may lead to tissue damage in the colon that causes bleeding. The diverticula may also burst (rupture) and cause infected stool to enter other areas of the abdomen. Complications of diverticulitis can include:  Bleeding.  Severe infection.  Severe pain.  Rupture (perforation) of the colon.  Blockage (obstruction) of the colon.  What are the causes? This condition is caused by stool becoming trapped in the diverticula, which allows bacteria to grow in the diverticula. This leads to inflammation and infection. What increases the risk? You are more likely to develop this condition if:  You have diverticulosis. The risk for diverticulosis increases if: ? You are overweight or obese. ? You use tobacco products. ? You do not get enough exercise.  You eat a diet that does not include enough fiber. High-fiber foods include fruits, vegetables, beans, nuts, and whole grains.  What are the signs or symptoms? Symptoms of this condition may include:  Pain and tenderness in the abdomen. The pain is normally located on the left side of the abdomen, but it may occur in other areas.  Fever and chills.  Bloating.  Cramping.  Nausea.  Vomiting.  Changes in bowel routines.  Blood in your stool.  How is this diagnosed? This condition is diagnosed based on:  Your medical history.  A physical exam.  Tests to make  sure there is nothing else causing your condition. These tests may include: ? Blood tests. ? Urine tests. ? Imaging tests of the abdomen, including X-rays, ultrasounds, MRIs, or CT scans.  How is this treated? Most cases of this condition are mild and can be treated at home. Treatment may include:  Taking over-the-counter pain medicines.  Following a clear liquid diet.  Taking antibiotic medicines by mouth.  Rest.  More severe cases may need to be treated at a hospital. Treatment may include:  Not eating or drinking.  Taking prescription pain medicine.  Receiving antibiotic medicines through an IV tube.  Receiving fluids and nutrition through an IV tube.  Surgery.  When your condition is under control, your health care provider may recommend that you have a colonoscopy. This is an exam to look at the entire large intestine. During the exam, a lubricated, bendable tube is inserted into the anus and then passed into the rectum, colon, and other parts of the large intestine. A colonoscopy can show how severe your diverticula are and whether something else may be causing your symptoms. Follow these instructions at home: Medicines  Take over-the-counter and prescription medicines only as told by your health care provider. These include fiber supplements, probiotics, and stool softeners.  If you were prescribed an antibiotic medicine, take it as told by your health care provider. Do not stop taking the antibiotic even if you start to feel better.  Do not drive or use heavy machinery while taking prescription pain medicine. General instructions  Follow a full liquid diet or another diet as directed by your health care provider. After your symptoms improve, your health care  provider may tell you to change your diet. He or she may recommend that you eat a diet that contains at least 25 g (25 grams) of fiber daily. Fiber makes it easier to pass stool. Healthy sources of fiber  include: ? Berries. One cup contains 4-8 grams of fiber. ? Beans or lentils. One half cup contains 5-8 grams of fiber. ? Green vegetables. One cup contains 4 grams of fiber.  Exercise for at least 30 minutes, 3 times each week. You should exercise hard enough to raise your heart rate and break a sweat.  Keep all follow-up visits as told by your health care provider. This is important. You may need a colonoscopy. Contact a health care provider if:  Your pain does not improve.  You have a hard time drinking or eating food.  Your bowel movements do not return to normal. Get help right away if:  Your pain gets worse.  Your symptoms do not get better with treatment.  Your symptoms suddenly get worse.  You have a fever.  You vomit more than one time.  You have stools that are bloody, black, or tarry. Summary  Diverticulitis is infection or inflammation of small pouches (diverticula) in the colon that form due to a condition called diverticulosis. Diverticula can trap stool (feces) and bacteria, causing infection and inflammation.  You are at higher risk for this condition if you have diverticulosis and you eat a diet that does not include enough fiber.  Most cases of this condition are mild and can be treated at home. More severe cases may need to be treated at a hospital.  When your condition is under control, your health care provider may recommend that you have an exam called a colonoscopy. This exam can show how severe your diverticula are and whether something else may be causing your symptoms. This information is not intended to replace advice given to you by your health care provider. Make sure you discuss any questions you have with your health care provider. Document Released: 08/18/2005 Document Revised: 12/11/2016 Document Reviewed: 12/11/2016 Elsevier Interactive Patient Education  2018 [employer].   IF you received an x-ray today, you will receive an invoice from  [facility]. Please contact [facility] at [phone] with questions or concerns regarding your invoice.   IF you received labwork today, you will receive an invoice from [place]. Please contact [facility] at [phone] with questions or concerns regarding your invoice.   Our billing staff will not be able to assist you with questions regarding bills from these companies.  You will be contacted with the lab results as soon as they are available. The fastest way to get your results is to activate your My Chart account. Instructions are located on the last page of this paperwork. If you have not heard from [place] regarding the results in 2 weeks, please contact this office.

## 2018-05-18 NOTE — Progress Notes (Signed)
I,Richard Velazquez,acting as a scribe for Richard Flood, MD.,have documented all relevant documentation on the behalf of Richard Flood, MD,as directed by  Richard Flood, MD while in the presence of Richard Flood, MD. 05/18/2018  4:03 PM  Subjective:    Patient ID: Richard Velazquez, male    DOB: [birth year], 62 y.o.   MRN: [id number]  Chief Complaint  Patient presents with  . Abdominal Pain    2 day follow up   . Blood Pressure Check    HPI Richard Velazquez is a 62 y.o. male who presents to Primary Care at [name] complaining of follow up of abdominal pain. He was seen 2 days ago with approximate hx of lower abominal pain. -Hx of Diverticulitis and prior hx of chronic prostatitis. CBC and urinalysis was overall reassuring.  -He was started on Augmentin 875 mg BID -CT scan on 05/17/2018 confirmed diverticulitis without abscess.  -He did have slight light headedness with borderline-low BP last visit fluids encouraged. He notes that his abdominal pain is improving, but still sore. He is feeling better overall. He notes an improvement in light-headedness and improved "[place]-Headedness" explained as unclear thoughts.  No fevers, no nausea or vomiting.  Tolerating Augmentin except did have diarrhea last night and he aided this with Imodium and drinking electrolytes. He denies nausea, vomiting, or fever. He has been eating soups. He loves cheese, but this upsets his stomach.   -He notes that he is going to [place] the entire month in October 2019. He notes that the might need approval in order to travel.   Ct Abdomen Pelvis W Contrast  Result Date: 05/18/2018 CLINICAL DATA:  LLQ pain x 3 days. Microscopic hematuria. No prev surg or hx ca. No prev CT. [place] in pacs EXAM: CT ABDOMEN AND PELVIS WITH CONTRAST TECHNIQUE: Multidetector CT imaging of the abdomen and pelvis was performed using the standard protocol following bolus administration of intravenous contrast. CONTRAST:  OMNIPAQUE  IOHEXOL 300 MG/ML  SOLN Creatinine was obtained on site at [facility] at [address] Results: Creatinine 1.1 mg/dL.  GFR 72 COMPARISON:  09/04/2014 FINDINGS: Lower chest: Linear scarring or subsegmental atelectasis posteriorly in the left lower lobe, stable. 3 mm subpleural nodule, posterior left lower lobe, stable. Hepatobiliary: 2.8 cm cyst in hepatic segment 3. Probable subcentimeter subcapsular cyst in segment 7, stable. No new lesion. No biliary ductal dilatation. Gallbladder unremarkable. Pancreas: Unremarkable. No pancreatic ductal dilatation or surrounding inflammatory changes. Spleen: Normal in size without focal abnormality. Adrenals/Urinary Tract: Normal adrenal glands. No renal mass or hydronephrosis. Urinary bladder physiologically distended. Stomach/Bowel: Stomach and small bowel are nondistended. Duodenal diverticulum noted. Appendix not identified. The colon is nondilated. Scattered descending and sigmoid diverticula. There is some mild inflammatory/edematous changes around the distal descending segment. No abscess. Vascular/Lymphatic: Scattered aortoiliac atheromatous calcifications without aneurysm or stenosis. No abdominal or pelvic adenopathy. Bilateral pelvic vascular calcifications. Reproductive: Moderate prostatic enlargement with coarse central calcifications. Other: No ascites.  No free air. Musculoskeletal: No acute or significant osseous findings. IMPRESSION: 1. Distal descending diverticulitis without abscess. 2. Stable patent cysts. 3. No renal mass or nephrolithiasis to suggest etiology of microscopic hematuria. There is marked prostatic enlargement. Electronically Signed   By: Corlis Leak M.D.   On: 05/18/2018 08:01    Patient Active Problem List   Diagnosis Date Noted  . Anxiety 05/08/2015  . Essential (primary) hypertension 09/19/2014  . Arthritis of hand, degenerative 09/19/2014  . Amnesia 03/01/2014  .  HIV disease (HCC) 11/04/2013  . Diverticulitis  09/20/2013  . Fatigue 09/20/2013  . Prostatitis 09/20/2013  . Carpal tunnel syndrome 03/01/2013  . Clinical depression 03/01/2013  . Lumbar disc disease with radiculopathy 03/01/2013   Past Medical History:  Diagnosis Date  . Diverticulitis   . HIV infection (HCC)   . Hypertension    History reviewed. No pertinent surgical history. Allergies  Allergen Reactions  . Androgel [Testosterone] Anaphylaxis  . Clindamycin/Lincomycin Other (See Comments)    Tendonitis   . Levaquin [Levofloxacin In D5w] Other (See Comments)    Tendonitis    . Cymbalta [Duloxetine Hcl] Other (See Comments)    fatigue  . Trazodone And Nefazodone Other (See Comments)    prioprism   . Sildenafil Rash    Anxiety, palpitations, no effect of drug   Prior to Admission medications   Medication Sig Start Date End Date Taking? Authorizing Provider  amoxicillin-clavulanate (AUGMENTIN) 875-125 MG tablet Take 1 tablet by mouth 2 (two) times daily. 05/16/18   Richard Flood, MD  Ascorbic Acid (VITAMIN C) 100 MG tablet Take 500 mg by mouth daily.     [provider]  atazanavir-cobicistat (EVOTAZ) 300-150 MG tablet Take by mouth. 09/22/17   [provider]  buPROPion (WELLBUTRIN SR) 150 MG 12 hr tablet Take 150 mg by mouth 2 (two) times daily.    [provider]  clobetasol ointment (TEMOVATE) 0.05 % Apply topically. 03/30/18 03/30/19  [provider]  cyanocobalamin 2000 MCG tablet Take 2,000 mcg by mouth daily.    [provider]  diphenoxylate-atropine (LOMOTIL) 2.5-0.025 MG/5ML liquid Take 5 mLs by mouth 4 (four) times daily as needed.    [provider]  fish oil-omega-3 fatty acids 1000 MG capsule Take 2 g by mouth daily.    [provider]  lamivudine (EPIVIR) 300 MG tablet Take 300 mg by mouth daily.    [provider]  lisinopril-hydrochlorothiazide (PRINZIDE,ZESTORETIC) 10-12.5 MG per tablet Take 1 tablet by mouth daily.    [provider]  LORazepam (ATIVAN) 0.5 MG tablet Take 0.5 mg by mouth every 8 (eight) hours.    [provider]  silodosin (RAPAFLO) 8 MG CAPS capsule Take 8 mg by mouth 2 (two) times daily.    [provider]  Tenofovir Alafenamide Fumarate (VEMLIDY) 25 MG TABS Take by mouth.    [provider]   Social History   Socioeconomic History  . Marital status: Single    Spouse name: Not on file  . Number of children: Not on file  . Years of education: Not on file  . Highest education level: Not on file  Occupational History  . Not on file  Social Needs  . Financial resource strain: Not on file  . Food insecurity:    Worry: Not on file    Inability: Not on file  . Transportation needs:    Medical: Not on file    Non-medical: Not on file  Tobacco Use  . Smoking status: Never Smoker  . Smokeless tobacco: Never Used  Substance and Sexual Activity  . Alcohol use: Yes  . Drug use: No  . Sexual activity: Yes  Lifestyle  . Physical activity:    Days per week: Not on file    Minutes per session: Not on file  . Stress: Not on file  Relationships  . Social connections:    Talks on phone: Not on file    Gets together: Not on file  Attends religious service: Not on file    Active member of club or organization: Not on file    Attends meetings of clubs or organizations: Not on file    Relationship status: Not on file  . Intimate partner violence:    Fear of current or ex partner: Not on file    Emotionally abused: Not on file    Physically abused: Not on file    Forced sexual activity: Not on file  Other Topics Concern  . Not on file  Social History Narrative  . Not on file     Review of Systems  Constitutional: Negative for fatigue and fever.  HENT: Negative.   Eyes: Negative.   Respiratory: Negative.  Negative for chest tightness and shortness of breath.   Cardiovascular: Negative.  Negative for chest pain.  Gastrointestinal: Positive for  abdominal pain and diarrhea. Negative for blood in stool, nausea and vomiting.  Genitourinary: Negative.   Musculoskeletal: Negative.   Skin: Positive for wound.  Neurological: Positive for light-headedness.  Psychiatric/Behavioral: Positive for confusion.  All other systems reviewed and are negative.      Objective:   Physical Exam  Constitutional: He is oriented to person, place, and time. He appears well-developed and well-nourished.  HENT:  Head: Normocephalic and atraumatic.  Eyes: Pupils are equal, round, and reactive to light. EOM are normal.  Neck: No JVD present. Carotid bruit is not present.  Cardiovascular: Normal rate, regular rhythm and normal heart sounds.  No murmur heard. Pulmonary/Chest: Effort normal and breath sounds normal. He has no rales.  Abdominal: Soft. Bowel sounds are normal. He exhibits no distension. There is tenderness in the right lower quadrant and left lower quadrant.  Slight tenderness in the lower abdominal quadrant  Musculoskeletal: He exhibits no edema.  Neurological: He is alert and oriented to person, place, and time.  Skin: Skin is warm and dry.  Psychiatric: He has a normal mood and affect.  Vitals reviewed.    Vitals:   05/18/18 1602  BP: 110/70  Pulse: 76  Temp: 97.6 F (36.4 C)  TempSrc: Oral  SpO2: 98%  Weight: 161 lb 3.2 oz (73.1 kg)  Height: 5\' 10"  (1.778 m)         Assessment & Plan:   Richard HiltsRichard F Treadway is a 62 y.o. male Diverticulitis of colon  -Improving, tolerating oral antibiotics.  No sign of abscess on CT scanning.  Blood pressure is improved and symptomatically improving.  Continue fluids with slow advancement of diet.  Follow-up with gastroenterology likely within the next 6 weeks.  RTC precautions discussed.  No orders of the defined types were placed in this encounter.  Patient Instructions    Slowly advance diet as pain improves. Fluids most important for now. Call your gastroenterologist to schedule  appointment to discuss other recommendations from here forward.  Follow up to discuss other medical history and plan for overseas travel in the next few months.    Diverticulitis Diverticulitis is infection or inflammation of small pouches (diverticula) in the colon that form due to a condition called diverticulosis. Diverticula can trap stool (feces) and bacteria, causing infection and inflammation. Diverticulitis may cause severe stomach pain and diarrhea. It may lead to tissue damage in the colon that causes bleeding. The diverticula may also burst (rupture) and cause infected stool to enter other areas of the abdomen. Complications of diverticulitis can include:  Bleeding.  Severe infection.  Severe pain.  Rupture (perforation) of the colon.  Blockage (obstruction) of  the colon.  What are the causes? This condition is caused by stool becoming trapped in the diverticula, which allows bacteria to grow in the diverticula. This leads to inflammation and infection. What increases the risk? You are more likely to develop this condition if:  You have diverticulosis. The risk for diverticulosis increases if: ? You are overweight or obese. ? You use tobacco products. ? You do not get enough exercise.  You eat a diet that does not include enough fiber. High-fiber foods include fruits, vegetables, beans, nuts, and whole grains.  What are the signs or symptoms? Symptoms of this condition may include:  Pain and tenderness in the abdomen. The pain is normally located on the left side of the abdomen, but it may occur in other areas.  Fever and chills.  Bloating.  Cramping.  Nausea.  Vomiting.  Changes in bowel routines.  Blood in your stool.  How is this diagnosed? This condition is diagnosed based on:  Your medical history.  A physical exam.  Tests to make sure there is nothing else causing your condition. These tests may include: ? Blood tests. ? Urine  tests. ? Imaging tests of the abdomen, including X-rays, ultrasounds, MRIs, or CT scans.  How is this treated? Most cases of this condition are mild and can be treated at home. Treatment may include:  Taking over-the-counter pain medicines.  Following a clear liquid diet.  Taking antibiotic medicines by mouth.  Rest.  More severe cases may need to be treated at a hospital. Treatment may include:  Not eating or drinking.  Taking prescription pain medicine.  Receiving antibiotic medicines through an IV tube.  Receiving fluids and nutrition through an IV tube.  Surgery.  When your condition is under control, your health care provider may recommend that you have a colonoscopy. This is an exam to look at the entire large intestine. During the exam, a lubricated, bendable tube is inserted into the anus and then passed into the rectum, colon, and other parts of the large intestine. A colonoscopy can show how severe your diverticula are and whether something else may be causing your symptoms. Follow these instructions at home: Medicines  Take over-the-counter and prescription medicines only as told by your health care provider. These include fiber supplements, probiotics, and stool softeners.  If you were prescribed an antibiotic medicine, take it as told by your health care provider. Do not stop taking the antibiotic even if you start to feel better.  Do not drive or use heavy machinery while taking prescription pain medicine. General instructions  Follow a full liquid diet or another diet as directed by your health care provider. After your symptoms improve, your health care provider may tell you to change your diet. He or she may recommend that you eat a diet that contains at least 25 g (25 grams) of fiber daily. Fiber makes it easier to pass stool. Healthy sources of fiber include: ? Berries. One cup contains 4-8 grams of fiber. ? Beans or lentils. One half cup contains 5-8 grams  of fiber. ? Green vegetables. One cup contains 4 grams of fiber.  Exercise for at least 30 minutes, 3 times each week. You should exercise hard enough to raise your heart rate and break a sweat.  Keep all follow-up visits as told by your health care provider. This is important. You may need a colonoscopy. Contact a health care provider if:  Your pain does not improve.  You have a hard time drinking  or eating food.  Your bowel movements do not return to normal. Get help right away if:  Your pain gets worse.  Your symptoms do not get better with treatment.  Your symptoms suddenly get worse.  You have a fever.  You vomit more than one time.  You have stools that are bloody, black, or tarry. Summary  Diverticulitis is infection or inflammation of small pouches (diverticula) in the colon that form due to a condition called diverticulosis. Diverticula can trap stool (feces) and bacteria, causing infection and inflammation.  You are at higher risk for this condition if you have diverticulosis and you eat a diet that does not include enough fiber.  Most cases of this condition are mild and can be treated at home. More severe cases may need to be treated at a hospital.  When your condition is under control, your health care provider may recommend that you have an exam called a colonoscopy. This exam can show how severe your diverticula are and whether something else may be causing your symptoms. This information is not intended to replace advice given to you by your health care provider. Make sure you discuss any questions you have with your health care provider. Document Released: 08/18/2005 Document Revised: 12/11/2016 Document Reviewed: 12/11/2016 Elsevier Interactive Patient Education  2018 [employer].   IF you received an x-ray today, you will receive an invoice from [facility]. Please contact [facility] at [phone] with questions or concerns regarding  your invoice.   IF you received labwork today, you will receive an invoice from [place]. Please contact [facility] at [phone] with questions or concerns regarding your invoice.   Our billing staff will not be able to assist you with questions regarding bills from these companies.  You will be contacted with the lab results as soon as they are available. The fastest way to get your results is to activate your My Chart account. Instructions are located on the last page of this paperwork. If you have not heard from [place] regarding the results in 2 weeks, please contact this office.       I personally performed the services described in this documentation, which was scribed in my presence. The recorded information has been reviewed and considered for accuracy and completeness, addended by me as needed, and agree with information above.  Signed,   [name], MD Primary Care at [facility].  05/19/18 2:14 PM

## 2018-05-22 DIAGNOSIS — K5732 Diverticulitis of large intestine without perforation or abscess without bleeding: Secondary | ICD-10-CM | POA: Diagnosis not present

## 2018-06-15 MED ORDER — LISINOPRIL 10 MG TABLET
ORAL_TABLET | Freq: Every day | ORAL | 6 refills | 0 days | Status: CP
Start: 2018-06-15 — End: 2019-04-05

## 2018-07-03 DIAGNOSIS — R69 Illness, unspecified: Secondary | ICD-10-CM | POA: Diagnosis not present

## 2018-07-20 ENCOUNTER — Other Ambulatory Visit: Payer: Self-pay

## 2018-07-20 ENCOUNTER — Encounter: Payer: Self-pay | Admitting: Family Medicine

## 2018-07-20 ENCOUNTER — Ambulatory Visit (INDEPENDENT_AMBULATORY_CARE_PROVIDER_SITE_OTHER): Payer: Medicare HMO | Admitting: Family Medicine

## 2018-07-20 VITALS — BP 100/66 | HR 81 | Temp 98.4°F | Ht 70.5 in | Wt 158.8 lb

## 2018-07-20 DIAGNOSIS — I10 Essential (primary) hypertension: Secondary | ICD-10-CM | POA: Diagnosis not present

## 2018-07-20 DIAGNOSIS — K5732 Diverticulitis of large intestine without perforation or abscess without bleeding: Secondary | ICD-10-CM

## 2018-07-20 DIAGNOSIS — Z23 Encounter for immunization: Secondary | ICD-10-CM

## 2018-07-20 DIAGNOSIS — Z13220 Encounter for screening for lipoid disorders: Secondary | ICD-10-CM | POA: Diagnosis not present

## 2018-07-20 DIAGNOSIS — Z0001 Encounter for general adult medical examination with abnormal findings: Secondary | ICD-10-CM | POA: Diagnosis not present

## 2018-07-20 DIAGNOSIS — N400 Enlarged prostate without lower urinary tract symptoms: Secondary | ICD-10-CM

## 2018-07-20 DIAGNOSIS — B079 Viral wart, unspecified: Secondary | ICD-10-CM | POA: Diagnosis not present

## 2018-07-20 DIAGNOSIS — Z0000 Encounter for general adult medical examination without abnormal findings: Secondary | ICD-10-CM

## 2018-07-20 LAB — COMPREHENSIVE METABOLIC PANEL
ALT: 14 IU/L (ref 0–44)
AST: 18 IU/L (ref 0–40)
Albumin/Globulin Ratio: 1.8 (ref 1.2–2.2)
Albumin: 4.6 g/dL (ref 3.6–4.8)
Alkaline Phosphatase: 48 IU/L (ref 39–117)
BUN/Creatinine Ratio: 14 (ref 10–24)
BUN: 11 mg/dL (ref 8–27)
Bilirubin Total: <0.2 mg/dL (ref 0.0–1.2)
CALCIUM: 9.4 mg/dL (ref 8.6–10.2)
CO2: 21 mmol/L (ref 20–29)
CREATININE: 0.76 mg/dL (ref 0.76–1.27)
Chloride: 102 mmol/L (ref 96–106)
GFR, EST AFRICAN AMERICAN: 114 mL/min/{1.73_m2} (ref >59)
GFR, EST NON AFRICAN AMERICAN: 98 mL/min/{1.73_m2} (ref >59)
Globulin, Total: 2.6 g/dL (ref 1.5–4.5)
Glucose: 80 mg/dL (ref 65–99)
Potassium: 4.3 mmol/L (ref 3.5–5.2)
Sodium: 139 mmol/L (ref 134–144)
TOTAL PROTEIN: 7.2 g/dL (ref 6.0–8.5)

## 2018-07-20 LAB — LIPID PANEL
CHOL/HDL RATIO: 2.6 ratio (ref 0.0–5.0)
Cholesterol, Total: 158 mg/dL (ref 100–199)
HDL: 61 mg/dL (ref >39)
LDL CALC: 72 mg/dL (ref 0–99)
TRIGLYCERIDES: 124 mg/dL (ref 0–149)
VLDL CHOLESTEROL CAL: 25 mg/dL (ref 5–40)

## 2018-07-20 MED ORDER — LISINOPRIL-HYDROCHLOROTHIAZIDE 10-12.5 MG PO TABS: 0.5000 | ORAL_TABLET | Freq: Every day | ORAL | 2 refills | Status: DC

## 2018-07-20 MED ORDER — ZOSTER VAC RECOMB ADJUVANTED 50 MCG/0.5ML IM SUSR: 0.5000 mL | Freq: Once | INTRAMUSCULAR | 1 refills | Status: AC

## 2018-07-20 MED ORDER — AMOXICILLIN-POT CLAVULANATE 875-125 MG PO TABS: 1.0000 | ORAL_TABLET | Freq: Two times a day (BID) | ORAL | 0 refills | Status: DC

## 2018-07-20 NOTE — Patient Instructions (Addendum)
Try lotion or cuticle oil around nails to help with dry skin.   Area on your toe appears to be common wart. Ok to try over the counter treatment or return to have that frozen here.   I prescribed Augmentin if you have signs or symptoms of return of diverticulitis.  However as we discussed if the symptoms worsen, or not starting to improve within a few days I would recommend evaluation in a medical facility for possible imaging.  Follow-up with gastroenterologist as planned.   Continue follow-up with infectious disease and urology as planned.  Please follow-up to discuss other concerns if needed.  Thank you for coming in today.  Keeping you healthy  Get these tests  Blood pressure- Have your blood pressure checked once a year by your healthcare provider.  Normal blood pressure is 120/80  Weight- Have your body mass index (BMI) calculated to screen for obesity.  BMI is a measure of body fat based on height and weight. You can also calculate your own BMI at [email or web address]/.  Cholesterol- Have your cholesterol checked every year.  Diabetes- Have your blood sugar checked regularly if you have high blood pressure, high cholesterol, have a family history of diabetes or if you are overweight.  Screening for Colon Cancer- Colonoscopy starting at age 62.  Screening may begin sooner depending on your family history and other health conditions. Follow up colonoscopy as directed by your Gastroenterologist.  Screening for Prostate Cancer- Both blood work (PSA) and a rectal exam help screen for Prostate Cancer.  Screening begins at age 62 with African-American men and at age 62 with Caucasian men.  Screening may begin sooner depending on your family history.  Take these medicines  Aspirin- One aspirin daily can help prevent Heart disease and Stroke.  Flu shot- Every fall.  Tetanus- Every 10 years.  Zostavax- Once after the age of 62 to prevent Shingles.  Pneumonia shot- Once after the  age of 62; if you are younger than 3365, ask your healthcare provider if you need a Pneumonia shot.  Take these steps  Don't smoke- If you do smoke, talk to your doctor about quitting.  For tips on how to quit, go to www.smokefree.gov or call 1-800-QUIT-NOW.  Be physically active- Exercise 5 days a week for at least 30 minutes.  If you are not already physically active start slow and gradually work up to 30 minutes of moderate physical activity.  Examples of moderate activity include walking briskly, mowing the yard, dancing, swimming, bicycling, etc.  Eat a healthy diet- Eat a variety of healthy food such as fruits, vegetables, low fat milk, low fat cheese, yogurt, lean meant, poultry, fish, beans, tofu, etc. For more information go to www.thenutritionsource.org  Drink alcohol in moderation- Limit alcohol intake to less than two drinks a day. Never drink and drive.  Dentist- Brush and floss twice daily; visit your dentist twice a year.  Depression- Your emotional health is as important as your physical health. If you're feeling down, or losing interest in things you would normally enjoy please talk to your healthcare provider.  Eye exam- Visit your eye doctor every year.  Safe sex- If you may be exposed to a sexually transmitted infection, use a condom.  Seat belts- Seat belts can save your life; always wear one.  Smoke/Carbon Monoxide detectors- These detectors need to be installed on the appropriate level of your home.  Replace batteries at least once a year.  Skin cancer- When out in the sun,  cover up and use sunscreen 15 SPF or higher.  Violence- If anyone is threatening you, please tell your healthcare provider.  Living Will/ Health care power of attorney- Speak with your healthcare provider and family.  If you have lab work done today you will be contacted with your lab results within the next 2 weeks.  If you have not heard from [place] then please contact us. The fastest way to get  your results is to register for My Chart.   IF you received an x-ray today, you will receive an invoice from [facility]. Please contact [facility] at [phone] with questions or concerns regarding your invoice.   IF you received labwork today, you will receive an invoice from [place]. Please contact [facility] at [phone] with questions or concerns regarding your invoice.   Our billing staff will not be able to assist you with questions regarding bills from these companies.  You will be contacted with the lab results as soon as they are available. The fastest way to get your results is to activate your My Chart account. Instructions are located on the last page of this paperwork. If you have not heard from [place] regarding the results in 2 weeks, please contact this office.

## 2018-07-20 NOTE — Progress Notes (Signed)
Subjective:    Patient ID: Richard Velazquez, male    DOB: 1955-12-25, 62 y.o.   MRN: [id number]  HPI Richard Velazquez is a 62 y.o. male Presents today for: Chief Complaint  Patient presents with  . Annual Exam    CPE  (pt states that he doesnt feel well today due to fasting for blood test)   Here for annual exam.  History of hypertension, depression/anxiety, HIV, BPH, recent diverticulitis.   Plans to travel to [place] at end of September for 1 month. Wants to have antibiotic available if diverticulitis flares - requests Augmentin.   Depression/anxiety: Takes Wellbutrin 150 mg twice daily previously, did recommend change to Cymbalta 20 mg daily when seen by infectious disease in May.  Too sedated on Cymbalta, went back to Wellbutrin. Not current therapist - does not feel that would be helpful at this time. He has used lorazepam in the past, rarely for sleep or anxiety 1-2 times per month per report at infectious disease visit in May.    History of HIV disease. Followed at [facility] infectious disease, Dr. Sheral Flow, in May.  Continued on Evotaz, Epivir, Vemlidy.  HIV RNA not detected at May 9 visit, CD4 count 615 in November 2018.   History of BPH, followed by [facility].  Office visit June 27.  Has been prescribed Rapaflo - 1 at night d/t cost - doing ok at this dose. PSA 4.7 on June 25.  Range of 3.49 to 4.59 over past few years.   History of diverticulitis, treated in June with Augmentin. Has followed up with Dr. Elnoria Howard a few weeks later.  If not traveling, would consider surgery. If recurs, plan to meet with surgeon due to recurrence.   Hypertension: BP Readings from Last 3 Encounters:  07/20/18 100/66  05/18/18 110/70  05/16/18 110/82   Lab Results  Component Value Date   CREATININE 1.24 05/05/2017  Takes lisinopril/HCTZ 10/12.5 mg  - 1/2 pill daily. Fasting today (lipid panel in May at [facility] not fasting). Home readings about 120/80. No new side effects with BP meds.   Cancer  screening: Colonoscopy: 09/10/16 Prostate cancer screening: over the past few years with urology.  PSA was declined at his last visit June 27.  Immunizations:  There is no immunization history on file for this patient.  Does not get flu shot - had reaction in past.  Has not received shingles vaccine. Had shingles 5-10 years ago.   Had tetanus vaccine and pneumovax by ID.   Depression screening: Depression screen [facility] 2/9 07/20/2018 05/18/2018 05/16/2018 05/17/2017 05/05/2017  Decreased Interest 0 0 0 0 0  Down, Depressed, Hopeless 0 0 0 0 0  PHQ - 2 Score 0 0 0 0 0  See info above  Vision screening:  Visual Acuity Screening   Right eye Left eye Both eyes  Without correction: 20/25 20/30-2 20/20  With correction:     hx of R eye issues - followed by Dr. Dione Booze - last ov few months ago, appt in December.    Dentist: Dr. Anise Salvo. At least every 6 months.   Exercise: walking for exercise, , 5 days per week and walking at home.   Hep C screening: Hep C antibody nonreactive 09/22/2017 through [facility]  Patient Active Problem List   Diagnosis Date Noted  . Anxiety 05/08/2015  . Essential (primary) hypertension 09/19/2014  . Arthritis of hand, degenerative 09/19/2014  . Amnesia 03/01/2014  . HIV disease (HCC) 11/04/2013  . Diverticulitis 09/20/2013  .  Fatigue 09/20/2013  . Prostatitis 09/20/2013  . Carpal tunnel syndrome 03/01/2013  . Clinical depression 03/01/2013  . Lumbar disc disease with radiculopathy 03/01/2013   Past Medical History:  Diagnosis Date  . Diverticulitis   . HIV infection (HCC)   . Hypertension    History reviewed. No pertinent surgical history. Allergies  Allergen Reactions  . Androgel [Testosterone] Anaphylaxis  . Clindamycin/Lincomycin Other (See Comments)    Tendonitis   . Levaquin [Levofloxacin In D5w] Other (See Comments)    Tendonitis    . Cymbalta [Duloxetine Hcl] Other (See Comments)    fatigue  . Trazodone And Nefazodone Other (See Comments)      prioprism   . Sildenafil Rash    Anxiety, palpitations, no effect of drug   Prior to Admission medications   Medication Sig Start Date End Date Taking? Authorizing Provider  amoxicillin-clavulanate (AUGMENTIN) 875-125 MG tablet Take 1 tablet by mouth 2 (two) times daily. 05/16/18   Shade Flood, MD  Ascorbic Acid (VITAMIN C) 100 MG tablet Take 500 mg by mouth daily.     [provider]  atazanavir-cobicistat (EVOTAZ) 300-150 MG tablet Take by mouth. 09/22/17   [provider]  buPROPion (WELLBUTRIN SR) 150 MG 12 hr tablet Take 150 mg by mouth 2 (two) times daily.    [provider]  clobetasol ointment (TEMOVATE) 0.05 % Apply topically. 03/30/18 03/30/19  [provider]  cyanocobalamin 2000 MCG tablet Take 2,000 mcg by mouth daily.    [provider]  diphenoxylate-atropine (LOMOTIL) 2.5-0.025 MG/5ML liquid Take 5 mLs by mouth 4 (four) times daily as needed.    [provider]  fish oil-omega-3 fatty acids 1000 MG capsule Take 2 g by mouth daily.    [provider]  lamivudine (EPIVIR) 300 MG tablet Take 300 mg by mouth daily.    [provider]  lisinopril-hydrochlorothiazide (PRINZIDE,ZESTORETIC) 10-12.5 MG per tablet Take 1 tablet by mouth daily.    [provider]  LORazepam (ATIVAN) 0.5 MG tablet Take 0.5 mg by mouth every 8 (eight) hours.    [provider]  silodosin (RAPAFLO) 8 MG CAPS capsule Take 8 mg by mouth 2 (two) times daily.    [provider]  Tenofovir Alafenamide Fumarate (VEMLIDY) 25 MG TABS Take by mouth.    [provider]   Social History   Socioeconomic History  . Marital status: Single    Spouse name: Not on file  . Number of children: Not on file  . Years of education: Not on file  . Highest education level: Not on file  Occupational History  . Not on file  Social Needs  . Financial resource strain: Not on file  . Food insecurity:    Worry: Not on  file    Inability: Not on file  . Transportation needs:    Medical: Not on file    Non-medical: Not on file  Tobacco Use  . Smoking status: Never Smoker  . Smokeless tobacco: Never Used  Substance and Sexual Activity  . Alcohol use: Yes  . Drug use: No  . Sexual activity: Yes  Lifestyle  . Physical activity:    Days per week: Not on file    Minutes per session: Not on file  . Stress: Not on file  Relationships  . Social connections:    Talks on phone: Not on file    Gets together: Not on file    Attends religious service: Not on file  Active member of club or organization: Not on file    Attends meetings of clubs or organizations: Not on file    Relationship status: Not on file  . Intimate partner violence:    Fear of current or ex partner: Not on file    Emotionally abused: Not on file    Physically abused: Not on file    Forced sexual activity: Not on file  Other Topics Concern  . Not on file  Social History Narrative  . Not on file    Review of Systems  Constitutional: Positive for appetite change (eating less recently. less appetite. ).  HENT: Positive for hearing loss (ongoing issue. has been seen by ENT. ) and tinnitus (has been followed by ENT. ).   Eyes: Positive for visual disturbance (R eye issues have been monitored by optho. ).  Cardiovascular: Positive for palpitations ("all my life"  no recent changes, has been evaluated by cardiology without concern.  may be related to anxiety. ).  Genitourinary: Positive for frequency (hx of BPH, has taken Rapaflo - 1 at night due to cost - doing ok. ).  Musculoskeletal: Positive for back pain and neck pain (back and neck chronic issues, no recent changes. ).  Skin: Positive for rash (on shoulders and upper chest. noted during summer.  has cream form infectious disease. ).  Psychiatric/Behavioral: Positive for sleep disturbance. The patient is nervous/anxious (chronic anxiety, sleep issues - see HPI. ).   rash on  shoulders improving with cream from ID.   Dry skin around fingernails at times. Plays piano. Had to cut back on playing temporarily.   Wart on toe - left second toe plantar surface, 6 weeks. Tried duct tape. Min relief.      Objective:   Physical Exam  Constitutional: He is oriented to person, place, and time. He appears well-developed and well-nourished.  HENT:  Head: Normocephalic and atraumatic.  Right Ear: External ear normal.  Left Ear: External ear normal.  Mouth/Throat: Oropharynx is clear and moist.  Eyes: Pupils are equal, round, and reactive to light. Conjunctivae and EOM are normal.  Neck: Normal range of motion. Neck supple. No thyromegaly present.  Cardiovascular: Normal rate, regular rhythm, normal heart sounds and intact distal pulses.  Pulmonary/Chest: Effort normal and breath sounds normal. No respiratory distress. He has no wheezes.  Abdominal: Soft. He exhibits no distension. There is no tenderness. A hernia is present.  Musculoskeletal: Normal range of motion. He exhibits no edema or tenderness.       Feet:  Lymphadenopathy:    He has no cervical adenopathy.  Neurological: He is alert and oriented to person, place, and time. He has normal reflexes.  Skin: Skin is warm and dry.  Psychiatric: He has a normal mood and affect. His behavior is normal.  Vitals reviewed.  Vitals:   07/20/18 0815  BP: 100/66  Pulse: 81  Temp: 98.4 F (36.9 C)  TempSrc: Oral  SpO2: 96%  Weight: 158 lb 12.8 oz (72 kg)  Height: 5' 10.5" (1.791 m)      Assessment & Plan:   Richard Velazquez is a 63 y.o. male Annual physical exam  - -anticipatory guidance as below in AVS, screening labs above. Health maintenance items as above in HPI discussed/recommended as applicable.   Warts of foot  - otc treatment options discussed, or return for cryodestruction.   Essential hypertension - Plan: Comprehensive metabolic panel, Lipid panel  -  Stable, tolerating current regimen. Medications  refilled. Labs  pending as above.   Diverticulitis of colon - Plan: amoxicillin-clavulanate (AUGMENTIN) 875-125 MG tablet  - history of recurrent cellulitis. Asymptomatic currently. Has follow up with GI. Possible surgical treatment if recurrence.   -augmentin rx given for overseas travel if flair, but ER/medical evla precautions if not improving within 48-72 hours of treatment or worsening.   Benign prostatic hyperplasia, unspecified whether lower urinary tract symptoms present  - follow up with urology.   Screening for hyperlipidemia - Plan: Lipid panel  Need for shingles vaccine - Plan: Zoster Vaccine Adjuvanted [facility]) injection sent to pharmacy.   Other nonurgent issues as above in ROS - stable by hx and longstanding.  Follow up to discuss specific issues if changes. Keep follow up with specialists.    Meds ordered this encounter  Medications  . amoxicillin-clavulanate (AUGMENTIN) 875-125 MG tablet    Sig: Take 1 tablet by mouth 2 (two) times daily.    Dispense:  20 tablet    Refill:  0  . Zoster Vaccine Adjuvanted [facility]) injection    Sig: Inject 0.5 mLs into the muscle once for 1 dose. Repeat in 2-6 months.    Dispense:  0.5 mL    Refill:  1  . DISCONTD: lisinopril-hydrochlorothiazide (PRINZIDE,ZESTORETIC) 10-12.5 MG tablet    Sig: Take 0.5 tablets by mouth daily.    Dispense:  45 tablet    Refill:  2  . lisinopril-hydrochlorothiazide (PRINZIDE,ZESTORETIC) 10-12.5 MG tablet    Sig: Take 0.5 tablets by mouth daily.    Dispense:  45 tablet    Refill:  2   Patient Instructions    Try lotion or cuticle oil around nails to help with dry skin.   Area on your toe appears to be common wart. Ok to try over the counter treatment or return to have that frozen here.   I prescribed Augmentin if you have signs or symptoms of return of diverticulitis.  However as we discussed if the symptoms worsen, or not starting to improve within a few days I would recommend evaluation in a  medical facility for possible imaging.  Follow-up with gastroenterologist as planned.   Continue follow-up with infectious disease and urology as planned.  Please follow-up to discuss other concerns if needed.  Thank you for coming in today.  Keeping you healthy  Get these tests  Blood pressure- Have your blood pressure checked once a year by your healthcare provider.  Normal blood pressure is 120/80  Weight- Have your body mass index (BMI) calculated to screen for obesity.  BMI is a measure of body fat based on height and weight. You can also calculate your own BMI at [email or web address].  Cholesterol- Have your cholesterol checked every year.  Diabetes- Have your blood sugar checked regularly if you have high blood pressure, high cholesterol, have a family history of diabetes or if you are overweight.  Screening for Colon Cancer- Colonoscopy starting at age 98.  Screening may begin sooner depending on your family history and other health conditions. Follow up colonoscopy as directed by your Gastroenterologist.  Screening for Prostate Cancer- Both blood work (PSA) and a rectal exam help screen for Prostate Cancer.  Screening begins at age 52 with African-American men and at age 1 with Caucasian men.  Screening may begin sooner depending on your family history.  Take these medicines  Aspirin- One aspirin daily can help prevent Heart disease and Stroke.  Flu shot- Every fall.  Tetanus- Every 10 years.  Zostavax- Once after the age of 8  to prevent Shingles.  Pneumonia shot- Once after the age of 62; if you are younger than 3165, ask your healthcare provider if you need a Pneumonia shot.  Take these steps  Don't smoke- If you do smoke, talk to your doctor about quitting.  For tips on how to quit, go to www.smokefree.gov or call 1-800-QUIT-NOW.  Be physically active- Exercise 5 days a week for at least 30 minutes.  If you are not already physically active start slow and  gradually work up to 30 minutes of moderate physical activity.  Examples of moderate activity include walking briskly, mowing the yard, dancing, swimming, bicycling, etc.  Eat a healthy diet- Eat a variety of healthy food such as fruits, vegetables, low fat milk, low fat cheese, yogurt, lean meant, poultry, fish, beans, tofu, etc. For more information go to www.thenutritionsource.org  Drink alcohol in moderation- Limit alcohol intake to less than two drinks a day. Never drink and drive.  Dentist- Brush and floss twice daily; visit your dentist twice a year.  Depression- Your emotional health is as important as your physical health. If you're feeling down, or losing interest in things you would normally enjoy please talk to your healthcare provider.  Eye exam- Visit your eye doctor every year.  Safe sex- If you may be exposed to a sexually transmitted infection, use a condom.  Seat belts- Seat belts can save your life; always wear one.  Smoke/Carbon Monoxide detectors- These detectors need to be installed on the appropriate level of your home.  Replace batteries at least once a year.  Skin cancer- When out in the sun, cover up and use sunscreen 15 SPF or higher.  Violence- If anyone is threatening you, please tell your healthcare provider.  Living Will/ Health care power of attorney- Speak with your healthcare provider and family.  If you have lab work done today you will be contacted with your lab results within the next 2 weeks.  If you have not heard from us then please contact us. The fastest way to get your results is to register for My Chart.   IF you received an x-ray today, you will receive an invoice from [facility]. Please contact [facility] at [phone] with questions or concerns regarding your invoice.   IF you received labwork today, you will receive an invoice from [facility]. Please contact [facility] at [phone] with questions or concerns regarding  your invoice.   Our billing staff will not be able to assist you with questions regarding bills from these companies.  You will be contacted with the lab results as soon as they are available. The fastest way to get your results is to activate your My Chart account. Instructions are located on the last page of this paperwork. If you have not heard from us regarding the results in 2 weeks, please contact this office.       Signed,   Meredith StaggersJeffrey Kaycee Haycraft, MD [facility].  07/22/18 11:39 PM

## 2018-07-29 MED ORDER — TENOFOVIR ALAFENAMIDE 25 MG TABLET
ORAL_TABLET | Freq: Every day | ORAL | 10 refills | 0.00000 days | Status: CP
Start: 2018-07-29 — End: 2019-07-10

## 2018-07-31 ENCOUNTER — Encounter: Payer: Self-pay | Admitting: Family Medicine

## 2018-07-31 DIAGNOSIS — K5732 Diverticulitis of large intestine without perforation or abscess without bleeding: Secondary | ICD-10-CM | POA: Diagnosis not present

## 2018-07-31 DIAGNOSIS — R1032 Left lower quadrant pain: Secondary | ICD-10-CM | POA: Diagnosis not present

## 2018-08-11 ENCOUNTER — Ambulatory Visit (INDEPENDENT_AMBULATORY_CARE_PROVIDER_SITE_OTHER): Payer: Medicare HMO | Admitting: Family Medicine

## 2018-08-11 ENCOUNTER — Other Ambulatory Visit: Payer: Self-pay

## 2018-08-11 ENCOUNTER — Encounter: Payer: Self-pay | Admitting: Family Medicine

## 2018-08-11 VITALS — BP 111/73 | HR 89 | Temp 98.4°F | Ht 70.5 in | Wt 160.8 lb

## 2018-08-11 DIAGNOSIS — N401 Enlarged prostate with lower urinary tract symptoms: Secondary | ICD-10-CM

## 2018-08-11 DIAGNOSIS — K5792 Diverticulitis of intestine, part unspecified, without perforation or abscess without bleeding: Secondary | ICD-10-CM

## 2018-08-11 NOTE — Progress Notes (Signed)
Subjective:  By signing my name below, I, [name], attest that this documentation has been prepared under the direction and in the presence of [name], MD. Electronically Signed: [name], Scribe. 08/11/2018 , 2:22 PM .  Patient was seen in Room 2 .   Patient ID: Richard Velazquez, male    DOB: 01/06/1956, 62 y.o.   MRN: [id number] Chief Complaint  Patient presents with  . Diverticulitis    f/u   . Travel Consult    [place] (next thursday)   HPI Richard Velazquez is a 62 y.o. male  He has a history of recurrent diverticulitis. His GI is Dr. Elnoria Howard, see previous visits and email. He did have a recent flare and saw GI on Sept 9th, restarted Augmentin, plan for 14 days treatment. He does have a follow up visit with Dr. Elnoria Howard in 3 days to determine if his travel to [place] is okay. Patient also brought in a copy of his living will to scan in today.   Patient is planning to travel to [place] next Thursday Sept 26th. He had started the Augmentin about 12 days ago, and his pain has improved. He denies appetite changes, bowel symptoms, blood in stool or fever. He does have another 14 day treatment at the pharmacy as a safety net prescribed by Dr. Elnoria Howard. He informs having a good local doctor available overseas in [place], in case anything happens. He believes his most recent flare is due stress dealing with pharmacy and his prescriptions. He'll be staying at a friend's place; friend of 40 years and an organist. The trip will be good for him mentally with the stress he's been dealing with.   He mentions having urinary frequency during the day. He's followed by urologist, Dr. Alvester Morin. He's been taking Rapaflo 8 mg qd. His nocturia has improved with just once a night, but during the day, he's having more frequency. He isn't taking the 2nd dose of Rapaflo during the day due to insurance management.    Patient Active Problem List   Diagnosis Date Noted  . Anxiety 05/08/2015  . Essential (primary)  hypertension 09/19/2014  . Arthritis of hand, degenerative 09/19/2014  . Amnesia 03/01/2014  . HIV disease (HCC) 11/04/2013  . Diverticulitis 09/20/2013  . Fatigue 09/20/2013  . Prostatitis 09/20/2013  . Carpal tunnel syndrome 03/01/2013  . Clinical depression 03/01/2013  . Lumbar disc disease with radiculopathy 03/01/2013   Past Medical History:  Diagnosis Date  . Diverticulitis   . HIV infection (HCC)   . Hypertension    No past surgical history on file. Allergies  Allergen Reactions  . Androgel [Testosterone] Anaphylaxis  . Clindamycin/Lincomycin Other (See Comments)    Tendonitis   . Levaquin [Levofloxacin In D5w] Other (See Comments)    Tendonitis    . Cymbalta [Duloxetine Hcl] Other (See Comments)    fatigue  . Trazodone And Nefazodone Other (See Comments)    prioprism   . Sildenafil Rash    Anxiety, palpitations, no effect of drug   Prior to Admission medications   Medication Sig Start Date End Date Taking? Authorizing Provider  amoxicillin-clavulanate (AUGMENTIN) 875-125 MG tablet Take 1 tablet by mouth 2 (two) times daily. 07/20/18   Shade Flood, MD  Ascorbic Acid (VITAMIN C) 100 MG tablet Take 500 mg by mouth daily.     [provider]  atazanavir-cobicistat (EVOTAZ) 300-150 MG tablet Take by mouth. 09/22/17   [provider]  buPROPion (WELLBUTRIN SR) 150 MG 12  hr tablet Take 150 mg by mouth 2 (two) times daily.    [provider]  clobetasol ointment (TEMOVATE) 0.05 % Apply topically. 03/30/18 03/30/19  [provider]  cyanocobalamin 2000 MCG tablet Take 2,000 mcg by mouth daily.    [provider]  diphenoxylate-atropine (LOMOTIL) 2.5-0.025 MG/5ML liquid Take 5 mLs by mouth 4 (four) times daily as needed.    [provider]  fish oil-omega-3 fatty acids 1000 MG capsule Take 2 g by mouth daily.    [provider]  lamivudine (EPIVIR) 300 MG tablet Take 300 mg by mouth daily.    [provider]  lisinopril-hydrochlorothiazide (PRINZIDE,ZESTORETIC) 10-12.5 MG tablet Take 0.5 tablets by mouth daily. 07/20/18   Shade Flood, MD  LORazepam (ATIVAN) 0.5 MG tablet Take 0.5 mg by mouth every 8 (eight) hours.    [provider]  silodosin (RAPAFLO) 8 MG CAPS capsule Take 8 mg by mouth 2 (two) times daily.    [provider]  Tenofovir Alafenamide Fumarate (VEMLIDY) 25 MG TABS Take by mouth.    [provider]   Social History   Socioeconomic History  . Marital status: Single    Spouse name: Not on file  . Number of children: Not on file  . Years of education: Not on file  . Highest education level: Not on file  Occupational History  . Not on file  Social Needs  . Financial resource strain: Not on file  . Food insecurity:    Worry: Not on file    Inability: Not on file  . Transportation needs:    Medical: Not on file    Non-medical: Not on file  Tobacco Use  . Smoking status: Never Smoker  . Smokeless tobacco: Never Used  Substance and Sexual Activity  . Alcohol use: Yes  . Drug use: No  . Sexual activity: Yes  Lifestyle  . Physical activity:    Days per week: Not on file    Minutes per session: Not on file  . Stress: Not on file  Relationships  . Social connections:    Talks on phone: Not on file    Gets together: Not on file    Attends religious service: Not on file    Active member of club or organization: Not on file    Attends meetings of clubs or organizations: Not on file    Relationship status: Not on file  . Intimate partner violence:    Fear of current or ex partner: Not on file    Emotionally abused: Not on file    Physically abused: Not on file    Forced sexual activity: Not on file  Other Topics Concern  . Not on file  Social History Narrative  . Not on file   Review of Systems  Constitutional: Negative for fatigue and unexpected weight change.  Eyes: Negative for visual disturbance.  Respiratory:  Negative for cough, chest tightness and shortness of breath.   Cardiovascular: Negative for chest pain, palpitations and leg swelling.  Gastrointestinal: Negative for abdominal pain and blood in stool.  Neurological: Negative for dizziness, light-headedness and headaches.       Objective:   Physical Exam  Constitutional: He is oriented to person, place, and time. He appears well-developed and well-nourished. No distress.  HENT:  Head: Normocephalic and atraumatic.  Eyes: Pupils are equal, round, and reactive to light. EOM are normal.  Neck: Neck supple.  Cardiovascular: Normal rate.  Pulmonary/Chest: Effort normal. No  respiratory distress.  Abdominal: Soft. There is tenderness. There is no rebound and no guarding.  Minimal tenderness of lower abdomen   Musculoskeletal: Normal range of motion.  Neurological: He is alert and oriented to person, place, and time.  Skin: Skin is warm and dry.  Psychiatric: He has a normal mood and affect. His behavior is normal.  Nursing note and vitals reviewed.   Vitals:   08/11/18 1346  BP: 111/73  Pulse: 89  Temp: 98.4 F (36.9 C)  TempSrc: Oral  SpO2: 95%  Weight: 160 lb 12.8 oz (72.9 kg)  Height: 5' 10.5" (1.791 m)       Assessment & Plan:   Richard Velazquez is a 62 y.o. male Diverticulitis  -Recurrent diverticulitis, managed by gastroenterology.  Question of upcoming overseas trip.  As he is clinically improved do not suspect there would be any restriction at this time for him traveling.  We did discuss potential treatment options if he is overseas and has recurrent flare.   -Ultimate decision on surgical treatment will be decided by gastroenterologist, has appointment coming up in 3 days.  Benign prostatic hyperplasia with lower urinary tract symptoms, symptom details unspecified  -Increased daytime symptoms on lower dose of Rapaflo.  Discussed restarting it twice per day, or discussing with urology other management options.    No  orders of the defined types were placed in this encounter.  Patient Instructions    Ultimately I would yield to recommendations of Dr. Elnoria HowardHung regarding travel with diverticulitis, and whether further treatment needed prior to travel next week. If you do have a flair overseas, would restart augmentin, then seek care with provider locally.  If decision needed on safety of travel, local physician can determine safety of return flight.  Dr. Elnoria HowardHung can advise you further on surgery necessity as well.   It may be worth discussing options with meds for prostate with Dr. Alvester MorinBell. Restarting Rapaflo twice per day may be needed.     Benign Prostatic Hyperplasia Benign prostatic hyperplasia (BPH) is an enlarged prostate gland that is caused by the normal aging process and not by cancer. The prostate is a walnut-sized gland that is involved in the production of semen. It is located in front of the rectum and below the bladder. The bladder stores urine and the urethra is the tube that carries the urine out of the body. The prostate may get bigger as a man gets older. An enlarged prostate can press on the urethra. This can make it harder to pass urine. The build-up of urine in the bladder can cause infection. Back pressure and infection may progress to bladder damage and kidney (renal) failure. What are the causes? This condition is part of a normal aging process. However, not all men develop problems from this condition. If the prostate enlarges away from the urethra, urine flow will not be blocked. If it enlarges toward the urethra and compresses it, there will be problems passing urine. What increases the risk? This condition is more likely to develop in men over the age of 50 years. What are the signs or symptoms? Symptoms of this condition include:  Getting up often during the night to urinate.  Needing to urinate frequently during the day.  Difficulty starting urine flow.  Decrease in size and strength  of your urine stream.  Leaking (dribbling) after urinating.  Inability to pass urine. This needs immediate treatment.  Inability to completely empty your bladder.  Pain when you pass urine. This  is more common if there is also an infection.  Urinary tract infection (UTI).  How is this diagnosed? This condition is diagnosed based on your medical history, a physical exam, and your symptoms. Tests will also be done, such as:  A post-void bladder scan. This measures any amount of urine that may remain in your bladder after you finish urinating.  A digital rectal exam. In a rectal exam, your health care provider checks your prostate by putting a lubricated, gloved finger into your rectum to feel the back of your prostate gland. This exam detects the size of your gland and any abnormal lumps or growths.  An exam of your urine (urinalysis).  A prostate specific antigen (PSA) screening. This is a blood test used to screen for prostate cancer.  An ultrasound. This test uses sound waves to electronically produce a picture of your prostate gland.  Your health care provider may refer you to a specialist in kidney and prostate diseases (urologist). How is this treated? Once symptoms begin, your health care provider will monitor your condition (active surveillance or watchful waiting). Treatment for this condition will depend on the severity of your condition. Treatment may include:  Observation and yearly exams. This may be the only treatment needed if your condition and symptoms are mild.  Medicines to relieve your symptoms, including: ? Medicines to shrink the prostate. ? Medicines to relax the muscle of the prostate.  Surgery in severe cases. Surgery may include: ? Prostatectomy. In this procedure, the prostate tissue is removed completely through an open incision or with a laparascope or robotics. ? Transurethral resection of the prostate (TURP). In this procedure, a tool is inserted  through the opening at the tip of the penis (urethra). It is used to cut away tissue of the inner core of the prostate. The pieces are removed through the same opening of the penis. This removes the blockage. ? Transurethral incision (TUIP). In this procedure, small cuts are made in the prostate. This lessens the prostate's pressure on the urethra. ? Transurethral microwave thermotherapy (TUMT). This procedure uses microwaves to create heat. The heat destroys and removes a small amount of prostate tissue. ? Transurethral needle ablation (TUNA). This procedure uses radio frequencies to destroy and remove a small amount of prostate tissue. ? Interstitial laser coagulation (ILC). This procedure uses a laser to destroy and remove a small amount of prostate tissue. ? Transurethral electrovaporization (TUVP). This procedure uses electrodes to destroy and remove a small amount of prostate tissue. ? Prostatic urethral lift. This procedure inserts an implant to push the lobes of the prostate away from the urethra.  Follow these instructions at home:  Take over-the-counter and prescription medicines only as told by your health care provider.  Monitor your symptoms for any changes. Contact your health care provider with any changes.  Avoid drinking large amounts of liquid before going to bed or out in public.  Avoid or reduce how much caffeine or alcohol you drink.  Give yourself time when you urinate.  Keep all follow-up visits as told by your health care provider. This is important. Contact a health care provider if:  You have unexplained back pain.  Your symptoms do not get better with treatment.  You develop side effects from the medicine you are taking.  Your urine becomes very dark or has a bad smell.  Your lower abdomen becomes distended and you have trouble passing your urine. Get help right away if:  You have a fever or  chills.  You suddenly cannot urinate.  You feel lightheaded,  or very dizzy, or you faint.  There are large amounts of blood or clots in the urine.  Your urinary problems become hard to manage.  You develop moderate to severe low back or flank pain. The flank is the side of your body between the ribs and the hip. These symptoms may represent a serious problem that is an emergency. Do not wait to see if the symptoms will go away. Get medical help right away. Call your local emergency services (911 in the [place] Do not drive yourself to the hospital. Summary  Benign prostatic hyperplasia (BPH) is an enlarged prostate that is caused by the normal aging process and not by cancer.  An enlarged prostate can press on the urethra. This can make it hard to pass urine.  This condition is part of a normal aging process and is more likely to develop in men over the age of 50 years.  Get help right away if you suddenly cannot urinate. This information is not intended to replace advice given to you by your health care provider. Make sure you discuss any questions you have with your health care provider. Document Released: 11/08/2005 Document Revised: 12/13/2016 Document Reviewed: 12/13/2016 Elsevier Interactive Patient Education  2018 [employer].   Diverticulitis Diverticulitis is infection or inflammation of small pouches (diverticula) in the colon that form due to a condition called diverticulosis. Diverticula can trap stool (feces) and bacteria, causing infection and inflammation. Diverticulitis may cause severe stomach pain and diarrhea. It may lead to tissue damage in the colon that causes bleeding. The diverticula may also burst (rupture) and cause infected stool to enter other areas of the abdomen. Complications of diverticulitis can include:  Bleeding.  Severe infection.  Severe pain.  Rupture (perforation) of the colon.  Blockage (obstruction) of the colon.  What are the causes? This condition is caused by stool becoming trapped in the  diverticula, which allows bacteria to grow in the diverticula. This leads to inflammation and infection. What increases the risk? You are more likely to develop this condition if:  You have diverticulosis. The risk for diverticulosis increases if: ? You are overweight or obese. ? You use tobacco products. ? You do not get enough exercise.  You eat a diet that does not include enough fiber. High-fiber foods include fruits, vegetables, beans, nuts, and whole grains.  What are the signs or symptoms? Symptoms of this condition may include:  Pain and tenderness in the abdomen. The pain is normally located on the left side of the abdomen, but it may occur in other areas.  Fever and chills.  Bloating.  Cramping.  Nausea.  Vomiting.  Changes in bowel routines.  Blood in your stool.  How is this diagnosed? This condition is diagnosed based on:  Your medical history.  A physical exam.  Tests to make sure there is nothing else causing your condition. These tests may include: ? Blood tests. ? Urine tests. ? Imaging tests of the abdomen, including X-rays, ultrasounds, MRIs, or CT scans.  How is this treated? Most cases of this condition are mild and can be treated at home. Treatment may include:  Taking over-the-counter pain medicines.  Following a clear liquid diet.  Taking antibiotic medicines by mouth.  Rest.  More severe cases may need to be treated at a hospital. Treatment may include:  Not eating or drinking.  Taking prescription pain medicine.  Receiving antibiotic medicines through an IV  tube.  Receiving fluids and nutrition through an IV tube.  Surgery.  When your condition is under control, your health care provider may recommend that you have a colonoscopy. This is an exam to look at the entire large intestine. During the exam, a lubricated, bendable tube is inserted into the anus and then passed into the rectum, colon, and other parts of the large  intestine. A colonoscopy can show how severe your diverticula are and whether something else may be causing your symptoms. Follow these instructions at home: Medicines  Take over-the-counter and prescription medicines only as told by your health care provider. These include fiber supplements, probiotics, and stool softeners.  If you were prescribed an antibiotic medicine, take it as told by your health care provider. Do not stop taking the antibiotic even if you start to feel better.  Do not drive or use heavy machinery while taking prescription pain medicine. General instructions  Follow a full liquid diet or another diet as directed by your health care provider. After your symptoms improve, your health care provider may tell you to change your diet. He or she may recommend that you eat a diet that contains at least 25 g (25 grams) of fiber daily. Fiber makes it easier to pass stool. Healthy sources of fiber include: ? Berries. One cup contains 4-8 grams of fiber. ? Beans or lentils. One half cup contains 5-8 grams of fiber. ? Green vegetables. One cup contains 4 grams of fiber.  Exercise for at least 30 minutes, 3 times each week. You should exercise hard enough to raise your heart rate and break a sweat.  Keep all follow-up visits as told by your health care provider. This is important. You may need a colonoscopy. Contact a health care provider if:  Your pain does not improve.  You have a hard time drinking or eating food.  Your bowel movements do not return to normal. Get help right away if:  Your pain gets worse.  Your symptoms do not get better with treatment.  Your symptoms suddenly get worse.  You have a fever.  You vomit more than one time.  You have stools that are bloody, black, or tarry. Summary  Diverticulitis is infection or inflammation of small pouches (diverticula) in the colon that form due to a condition called diverticulosis. Diverticula can trap stool  (feces) and bacteria, causing infection and inflammation.  You are at higher risk for this condition if you have diverticulosis and you eat a diet that does not include enough fiber.  Most cases of this condition are mild and can be treated at home. More severe cases may need to be treated at a hospital.  When your condition is under control, your health care provider may recommend that you have an exam called a colonoscopy. This exam can show how severe your diverticula are and whether something else may be causing your symptoms. This information is not intended to replace advice given to you by your health care provider. Make sure you discuss any questions you have with your health care provider. Document Released: 08/18/2005 Document Revised: 12/11/2016 Document Reviewed: 12/11/2016 Elsevier Interactive Patient Education  [employer].    If you have lab work done today you will be contacted with your lab results within the next 2 weeks.  If you have not heard from [place] then please contact us. The fastest way to get your results is to register for My Chart.   IF you received an x-ray  today, you will receive an invoice from [facility]. Please contact [facility] at [phone] with questions or concerns regarding your invoice.   IF you received labwork today, you will receive an invoice from [facility]. Please contact [facility] at [phone] with questions or concerns regarding your invoice.   Our billing staff will not be able to assist you with questions regarding bills from these companies.  You will be contacted with the lab results as soon as they are available. The fastest way to get your results is to activate your My Chart account. Instructions are located on the last page of this paperwork. If you have not heard from [place] regarding the results in 2 weeks, please contact this office.       I personally performed the services described in this documentation,  which was scribed in my presence. The recorded information has been reviewed and considered for accuracy and completeness, addended by me as needed, and agree with information above.  Signed,   [name], MD [facility].  08/11/18 2:33 PM

## 2018-08-11 NOTE — Patient Instructions (Addendum)
Ultimately I would yield to recommendations of Dr. Elnoria Howard regarding travel with diverticulitis, and whether further treatment needed prior to travel next week. If you do have a flair overseas, would restart augmentin, then seek care with provider locally.  If decision needed on safety of travel, local physician can determine safety of return flight.  Dr. Elnoria Howard can advise you further on surgery necessity as well.   It may be worth discussing options with meds for prostate with Dr. Alvester Morin. Restarting Rapaflo twice per day may be needed.     Benign Prostatic Hyperplasia Benign prostatic hyperplasia (BPH) is an enlarged prostate gland that is caused by the normal aging process and not by cancer. The prostate is a walnut-sized gland that is involved in the production of semen. It is located in front of the rectum and below the bladder. The bladder stores urine and the urethra is the tube that carries the urine out of the body. The prostate may get bigger as a man gets older. An enlarged prostate can press on the urethra. This can make it harder to pass urine. The build-up of urine in the bladder can cause infection. Back pressure and infection may progress to bladder damage and kidney (renal) failure. What are the causes? This condition is part of a normal aging process. However, not all men develop problems from this condition. If the prostate enlarges away from the urethra, urine flow will not be blocked. If it enlarges toward the urethra and compresses it, there will be problems passing urine. What increases the risk? This condition is more likely to develop in men over the age of 50 years. What are the signs or symptoms? Symptoms of this condition include:  Getting up often during the night to urinate.  Needing to urinate frequently during the day.  Difficulty starting urine flow.  Decrease in size and strength of your urine stream.  Leaking (dribbling) after urinating.  Inability to pass  urine. This needs immediate treatment.  Inability to completely empty your bladder.  Pain when you pass urine. This is more common if there is also an infection.  Urinary tract infection (UTI).  How is this diagnosed? This condition is diagnosed based on your medical history, a physical exam, and your symptoms. Tests will also be done, such as:  A post-void bladder scan. This measures any amount of urine that may remain in your bladder after you finish urinating.  A digital rectal exam. In a rectal exam, your health care provider checks your prostate by putting a lubricated, gloved finger into your rectum to feel the back of your prostate gland. This exam detects the size of your gland and any abnormal lumps or growths.  An exam of your urine (urinalysis).  A prostate specific antigen (PSA) screening. This is a blood test used to screen for prostate cancer.  An ultrasound. This test uses sound waves to electronically produce a picture of your prostate gland.  Your health care provider may refer you to a specialist in kidney and prostate diseases (urologist). How is this treated? Once symptoms begin, your health care provider will monitor your condition (active surveillance or watchful waiting). Treatment for this condition will depend on the severity of your condition. Treatment may include:  Observation and yearly exams. This may be the only treatment needed if your condition and symptoms are mild.  Medicines to relieve your symptoms, including: ? Medicines to shrink the prostate. ? Medicines to relax the muscle of the prostate.  Surgery in  severe cases. Surgery may include: ? Prostatectomy. In this procedure, the prostate tissue is removed completely through an open incision or with a laparascope or robotics. ? Transurethral resection of the prostate (TURP). In this procedure, a tool is inserted through the opening at the tip of the penis (urethra). It is used to cut away tissue of  the inner core of the prostate. The pieces are removed through the same opening of the penis. This removes the blockage. ? Transurethral incision (TUIP). In this procedure, small cuts are made in the prostate. This lessens the prostate's pressure on the urethra. ? Transurethral microwave thermotherapy (TUMT). This procedure uses microwaves to create heat. The heat destroys and removes a small amount of prostate tissue. ? Transurethral needle ablation (TUNA). This procedure uses radio frequencies to destroy and remove a small amount of prostate tissue. ? Interstitial laser coagulation (ILC). This procedure uses a laser to destroy and remove a small amount of prostate tissue. ? Transurethral electrovaporization (TUVP). This procedure uses electrodes to destroy and remove a small amount of prostate tissue. ? Prostatic urethral lift. This procedure inserts an implant to push the lobes of the prostate away from the urethra.  Follow these instructions at home:  Take over-the-counter and prescription medicines only as told by your health care provider.  Monitor your symptoms for any changes. Contact your health care provider with any changes.  Avoid drinking large amounts of liquid before going to bed or out in public.  Avoid or reduce how much caffeine or alcohol you drink.  Give yourself time when you urinate.  Keep all follow-up visits as told by your health care provider. This is important. Contact a health care provider if:  You have unexplained back pain.  Your symptoms do not get better with treatment.  You develop side effects from the medicine you are taking.  Your urine becomes very dark or has a bad smell.  Your lower abdomen becomes distended and you have trouble passing your urine. Get help right away if:  You have a fever or chills.  You suddenly cannot urinate.  You feel lightheaded, or very dizzy, or you faint.  There are large amounts of blood or clots in the  urine.  Your urinary problems become hard to manage.  You develop moderate to severe low back or flank pain. The flank is the side of your body between the ribs and the hip. These symptoms may represent a serious problem that is an emergency. Do not wait to see if the symptoms will go away. Get medical help right away. Call your local emergency services (911 in the [place] Do not drive yourself to the hospital. Summary  Benign prostatic hyperplasia (BPH) is an enlarged prostate that is caused by the normal aging process and not by cancer.  An enlarged prostate can press on the urethra. This can make it hard to pass urine.  This condition is part of a normal aging process and is more likely to develop in men over the age of 50 years.  Get help right away if you suddenly cannot urinate. This information is not intended to replace advice given to you by your health care provider. Make sure you discuss any questions you have with your health care provider. Document Released: 11/08/2005 Document Revised: 12/13/2016 Document Reviewed: 12/13/2016 Elsevier Interactive Patient Education  2018 [employer]   Diverticulitis Diverticulitis is infection or inflammation of small pouches (diverticula) in the colon that form due to a condition called diverticulosis.  Diverticula can trap stool (feces) and bacteria, causing infection and inflammation. Diverticulitis may cause severe stomach pain and diarrhea. It may lead to tissue damage in the colon that causes bleeding. The diverticula may also burst (rupture) and cause infected stool to enter other areas of the abdomen. Complications of diverticulitis can include:  Bleeding.  Severe infection.  Severe pain.  Rupture (perforation) of the colon.  Blockage (obstruction) of the colon.  What are the causes? This condition is caused by stool becoming trapped in the diverticula, which allows bacteria to grow in the diverticula. This leads to  inflammation and infection. What increases the risk? You are more likely to develop this condition if:  You have diverticulosis. The risk for diverticulosis increases if: ? You are overweight or obese. ? You use tobacco products. ? You do not get enough exercise.  You eat a diet that does not include enough fiber. High-fiber foods include fruits, vegetables, beans, nuts, and whole grains.  What are the signs or symptoms? Symptoms of this condition may include:  Pain and tenderness in the abdomen. The pain is normally located on the left side of the abdomen, but it may occur in other areas.  Fever and chills.  Bloating.  Cramping.  Nausea.  Vomiting.  Changes in bowel routines.  Blood in your stool.  How is this diagnosed? This condition is diagnosed based on:  Your medical history.  A physical exam.  Tests to make sure there is nothing else causing your condition. These tests may include: ? Blood tests. ? Urine tests. ? Imaging tests of the abdomen, including X-rays, ultrasounds, MRIs, or CT scans.  How is this treated? Most cases of this condition are mild and can be treated at home. Treatment may include:  Taking over-the-counter pain medicines.  Following a clear liquid diet.  Taking antibiotic medicines by mouth.  Rest.  More severe cases may need to be treated at a hospital. Treatment may include:  Not eating or drinking.  Taking prescription pain medicine.  Receiving antibiotic medicines through an IV tube.  Receiving fluids and nutrition through an IV tube.  Surgery.  When your condition is under control, your health care provider may recommend that you have a colonoscopy. This is an exam to look at the entire large intestine. During the exam, a lubricated, bendable tube is inserted into the anus and then passed into the rectum, colon, and other parts of the large intestine. A colonoscopy can show how severe your diverticula are and whether  something else may be causing your symptoms. Follow these instructions at home: Medicines  Take over-the-counter and prescription medicines only as told by your health care provider. These include fiber supplements, probiotics, and stool softeners.  If you were prescribed an antibiotic medicine, take it as told by your health care provider. Do not stop taking the antibiotic even if you start to feel better.  Do not drive or use heavy machinery while taking prescription pain medicine. General instructions  Follow a full liquid diet or another diet as directed by your health care provider. After your symptoms improve, your health care provider may tell you to change your diet. He or she may recommend that you eat a diet that contains at least 25 g (25 grams) of fiber daily. Fiber makes it easier to pass stool. Healthy sources of fiber include: ? Berries. One cup contains 4-8 grams of fiber. ? Beans or lentils. One half cup contains 5-8 grams of fiber. ? Green vegetables.  One cup contains 4 grams of fiber.  Exercise for at least 30 minutes, 3 times each week. You should exercise hard enough to raise your heart rate and break a sweat.  Keep all follow-up visits as told by your health care provider. This is important. You may need a colonoscopy. Contact a health care provider if:  Your pain does not improve.  You have a hard time drinking or eating food.  Your bowel movements do not return to normal. Get help right away if:  Your pain gets worse.  Your symptoms do not get better with treatment.  Your symptoms suddenly get worse.  You have a fever.  You vomit more than one time.  You have stools that are bloody, black, or tarry. Summary  Diverticulitis is infection or inflammation of small pouches (diverticula) in the colon that form due to a condition called diverticulosis. Diverticula can trap stool (feces) and bacteria, causing infection and inflammation.  You are at higher  risk for this condition if you have diverticulosis and you eat a diet that does not include enough fiber.  Most cases of this condition are mild and can be treated at home. More severe cases may need to be treated at a hospital.  When your condition is under control, your health care provider may recommend that you have an exam called a colonoscopy. This exam can show how severe your diverticula are and whether something else may be causing your symptoms. This information is not intended to replace advice given to you by your health care provider. Make sure you discuss any questions you have with your health care provider. Document Released: 08/18/2005 Document Revised: 12/11/2016 Document Reviewed: 12/11/2016 Elsevier Interactive Patient Education  [employer].    If you have lab work done today you will be contacted with your lab results within the next 2 weeks.  If you have not heard from [place] then please contact us. The fastest way to get your results is to register for My Chart.   IF you received an x-ray today, you will receive an invoice from [facility]. Please contact [facility] at [phone] with questions or concerns regarding your invoice.   IF you received labwork today, you will receive an invoice from [place]. Please contact [facility] at [phone] with questions or concerns regarding your invoice.   Our billing staff will not be able to assist you with questions regarding bills from these companies.  You will be contacted with the lab results as soon as they are available. The fastest way to get your results is to activate your My Chart account. Instructions are located on the last page of this paperwork. If you have not heard from [place] regarding the results in 2 weeks, please contact this office.

## 2018-08-14 DIAGNOSIS — K5732 Diverticulitis of large intestine without perforation or abscess without bleeding: Secondary | ICD-10-CM | POA: Diagnosis not present

## 2018-08-15 ENCOUNTER — Encounter: Payer: Self-pay | Admitting: Family Medicine

## 2018-09-11 MED ORDER — ATAZANAVIR 300 MG-COBICISTAT 150 MG TABLET
ORAL_TABLET | Freq: Every day | ORAL | 11 refills | 0 days | Status: CP
Start: 2018-09-11 — End: ?

## 2018-10-05 ENCOUNTER — Encounter
Admit: 2018-10-05 | Discharge: 2018-10-06 | Payer: MEDICARE | Attending: Infectious Disease | Primary: Infectious Disease

## 2018-10-05 DIAGNOSIS — M19042 Primary osteoarthritis, left hand: Secondary | ICD-10-CM | POA: Diagnosis not present

## 2018-10-05 DIAGNOSIS — R5382 Chronic fatigue, unspecified: Secondary | ICD-10-CM | POA: Diagnosis not present

## 2018-10-05 DIAGNOSIS — R69 Illness, unspecified: Secondary | ICD-10-CM | POA: Diagnosis not present

## 2018-10-05 DIAGNOSIS — B20 Human immunodeficiency virus [HIV] disease: Secondary | ICD-10-CM | POA: Diagnosis not present

## 2018-10-05 DIAGNOSIS — G5601 Carpal tunnel syndrome, right upper limb: Secondary | ICD-10-CM | POA: Diagnosis not present

## 2018-10-05 DIAGNOSIS — R413 Other amnesia: Secondary | ICD-10-CM | POA: Diagnosis not present

## 2018-10-05 DIAGNOSIS — I10 Essential (primary) hypertension: Secondary | ICD-10-CM | POA: Diagnosis not present

## 2018-10-05 DIAGNOSIS — M19041 Primary osteoarthritis, right hand: Secondary | ICD-10-CM | POA: Diagnosis not present

## 2018-10-05 DIAGNOSIS — F419 Anxiety disorder, unspecified: Secondary | ICD-10-CM

## 2018-10-05 DIAGNOSIS — F3289 Other specified depressive episodes: Secondary | ICD-10-CM

## 2018-10-05 MED ORDER — LORAZEPAM 1 MG TABLET
ORAL_TABLET | Freq: Every day | ORAL | 5 refills | 0 days | Status: CP
Start: 2018-10-05 — End: 2019-04-05

## 2018-10-13 ENCOUNTER — Other Ambulatory Visit: Payer: Self-pay | Admitting: General Surgery

## 2018-10-13 DIAGNOSIS — K5732 Diverticulitis of large intestine without perforation or abscess without bleeding: Secondary | ICD-10-CM | POA: Diagnosis not present

## 2018-10-14 ENCOUNTER — Encounter: Payer: Self-pay | Admitting: Family Medicine

## 2018-11-06 DIAGNOSIS — H2513 Age-related nuclear cataract, bilateral: Secondary | ICD-10-CM | POA: Diagnosis not present

## 2018-11-06 DIAGNOSIS — H01021 Squamous blepharitis right upper eyelid: Secondary | ICD-10-CM | POA: Diagnosis not present

## 2018-11-06 DIAGNOSIS — H01024 Squamous blepharitis left upper eyelid: Secondary | ICD-10-CM | POA: Diagnosis not present

## 2018-11-06 DIAGNOSIS — H43811 Vitreous degeneration, right eye: Secondary | ICD-10-CM | POA: Diagnosis not present

## 2018-11-06 DIAGNOSIS — H01022 Squamous blepharitis right lower eyelid: Secondary | ICD-10-CM | POA: Diagnosis not present

## 2018-11-06 DIAGNOSIS — H01025 Squamous blepharitis left lower eyelid: Secondary | ICD-10-CM | POA: Diagnosis not present

## 2018-11-17 ENCOUNTER — Encounter (HOSPITAL_COMMUNITY): Payer: Self-pay | Admitting: Physician Assistant

## 2018-11-17 NOTE — Pre-Procedure Instructions (Addendum)
Richard Velazquez  11/17/2018      [name] Richard Velazquez- Richard Velazquez - [address] AT [facility] OF [address] & [address] [place]  [id number] Phone: [phone] Fax: [phone]    [name], [name], [name]  Call this number if you have problems the morning of surgery:  [phone]   Remember:  Do not eat after midnight on Jan. 6th  You may drink clear liquids until 3 hours (4:30AM) prior to surgery.  Clear liquids allowed are:  Water, Juice (non-citric and without pulp), Carbonated beverages, Clear Tea, Black Coffee only, Plain Jell-O only, Gatorade and Plain Popsicles only   Please complete your 2 bottles of PRE-SURGERY ENSURE by 10:00PM the night before, and 1 bottle 3 hours (4:30AM) prior to you surgery start time.  Please, if able, drink it in one setting. DO NOT SIP.  Also, please follow the colon prep instructions that are given.    Take ONLY these medicines the morning of surgery with A SIP OF WATER:  BuPROPion (WELLBUTRIN SR), Lamivudine (EPIVIR), LORazepam (ATIVAN),  And Tenofovir Alafenamide Fumarate (VEMLIDY)  If needed: Diphenoxylate-atropine (LOMOTIL)  7 days before surgery (11/21/18), stop taking all Other Aspirin Products, Vitamins, Fish oils, and Herbal medications. Also stop all NSAIDS i.e. Advil, Ibuprofen, Motrin, Aleve, Anaprox, Naproxen, BC, Goody Powders, and all Supplements.     Do not wear jewelry.  Do not wear lotions, powders, colognes, or deodorant.  Do not shave 48 hours prior to surgery.  Men may shave face.  Do not bring valuables to the hospital.  [name] is not responsible for any belongings or valuables.  Contacts, dentures or bridgework may not be worn into surgery.  Leave your suitcase in the car.  After surgery it may be brought to your room.  For patients admitted to the  hospital, discharge time will be determined by your treatment team.  Patients discharged the day of surgery will not be allowed to drive home.   Special instructions:  [name]  Before surgery, you can play an important role. Because skin is not sterile, your skin needs to be as free of germs as possible. You can reduce the number of germs on your skin by washing with CHG (chlorahexidine gluconate) Soap before surgery.  CHG is an antiseptic cleaner which kills germs and bonds with the skin to continue killing germs even after washing.    Oral Hygiene is also important to reduce your risk of infection.  Remember - BRUSH YOUR TEETH THE MORNING OF SURGERY WITH YOUR REGULAR TOOTHPASTE  Please do not use if you have an allergy to CHG or antibacterial soaps. If your skin becomes reddened/irritated stop using the CHG.  Do not shave (including legs and underarms) for at least 48 hours prior to first CHG shower. It is OK to shave your face.  Please follow these instructions carefully.   1. Shower the NIGHT BEFORE SURGERY and the MORNING OF SURGERY with CHG.   2. If you chose to wash your hair, wash your hair first as usual with your normal shampoo.  3. After you shampoo, rinse your hair and body thoroughly to remove the shampoo.  4. Use CHG as you would any other liquid soap. You can apply CHG directly to the skin and wash gently with a scrungie or a clean  washcloth.   5. Apply the CHG Soap to your body ONLY FROM THE NECK DOWN.  Do not use on open wounds or open sores. Avoid contact with your eyes, ears, mouth and genitals (private parts). Wash Face and genitals (private parts)  with your normal soap.  6. Wash thoroughly, paying special attention to the area where your surgery will be performed.  7. Thoroughly rinse your body with warm water from the neck down.  8. DO NOT shower/wash with your normal soap after using and rinsing off the CHG Soap.  9. Pat yourself dry  with a CLEAN TOWEL.  10. Wear CLEAN PAJAMAS to bed the night before surgery, wear comfortable clothes the morning of surgery  11. Place CLEAN SHEETS on your bed the night of your first shower and DO NOT SLEEP WITH PETS.  Day of Surgery:  Do not apply any deodorants/lotions.  Please wear clean clothes to the hospital/surgery center.   Remember to brush your teeth WITH YOUR REGULAR TOOTHPASTE.  Please read over the following fact sheets that you were given. Pain Booklet, Coughing and Deep Breathing and Surgical Site Infection Prevention

## 2018-11-20 ENCOUNTER — Other Ambulatory Visit: Payer: Self-pay

## 2018-11-20 ENCOUNTER — Encounter (HOSPITAL_COMMUNITY): Payer: Self-pay

## 2018-11-20 ENCOUNTER — Encounter (HOSPITAL_COMMUNITY)
Admission: RE | Admit: 2018-11-20 | Discharge: 2018-11-20 | Disposition: A | Discharge status: Home / Self Care | Payer: Medicare HMO | Source: Ambulatory Visit | Attending: General Surgery | Admitting: General Surgery

## 2018-11-20 DIAGNOSIS — Z01818 Encounter for other preprocedural examination: Secondary | ICD-10-CM | POA: Diagnosis not present

## 2018-11-20 HISTORY — DX: Depression, unspecified: F32.A

## 2018-11-20 HISTORY — DX: Gastro-esophageal reflux disease without esophagitis: K21.9

## 2018-11-20 HISTORY — DX: Unspecified osteoarthritis, unspecified site: M19.90

## 2018-11-20 HISTORY — DX: Unspecified hearing loss, left ear: H91.92

## 2018-11-20 HISTORY — DX: Major depressive disorder, single episode, unspecified: F32.9

## 2018-11-20 LAB — CBC
HCT: 46.3 % (ref 39.0–52.0)
Hemoglobin: 15.6 g/dL (ref 13.0–17.0)
MCH: 31.3 pg (ref 26.0–34.0)
MCHC: 33.7 g/dL (ref 30.0–36.0)
MCV: 92.8 fL (ref 80.0–100.0)
Platelets: 218 10*3/uL (ref 150–400)
RBC: 4.99 MIL/uL (ref 4.22–5.81)
RDW: 11.7 % (ref 11.5–15.5)
WBC: 5.4 10*3/uL (ref 4.0–10.5)
nRBC: 0 % (ref 0.0–0.2)

## 2018-11-20 LAB — BASIC METABOLIC PANEL
Anion gap: 10 (ref 5–15)
BUN: 14 mg/dL (ref 8–23)
CO2: 24 mmol/L (ref 22–32)
CREATININE: 1.22 mg/dL (ref 0.61–1.24)
Calcium: 9.4 mg/dL (ref 8.9–10.3)
Chloride: 103 mmol/L (ref 98–111)
GFR calc Af Amer: >60 mL/min (ref >60)
GFR calc non Af Amer: >60 mL/min (ref >60)
Glucose, Bld: 111 mg/dL — ABNORMAL HIGH (ref 70–99)
Potassium: 4.2 mmol/L (ref 3.5–5.1)
Sodium: 137 mmol/L (ref 135–145)

## 2018-11-20 LAB — HEMOGLOBIN A1C
Hgb A1c MFr Bld: 5.1 % (ref 4.8–5.6)
Mean Plasma Glucose: 99.67 mg/dL

## 2018-11-20 NOTE — Progress Notes (Signed)
PCP - Dr. Meredith StaggersJeffrey Greene- [place]  Cardiologist - Denies  Chest x-ray - Denies  EKG - 11/20/18  Stress Test - Denies  ECHO - 03/11/08 (E)  Cardiac Cath - Denies  AICD- na PM- na LOOP- na  Sleep Study - Denies CPAP - None  LABS- 11/20/18: CBC, BMP, HIV  ASA- Denies  HA1C- 11/20/18   Anesthesia- Yes- Pt sts his pcp sts not to take Evotaz the day of surgery.  Pt denies having chest pain, sob, or fever at this time. All instructions explained to the pt, with a verbal understanding of the material. Pt agrees to go over the instructions while at home for a better understanding. The opportunity to ask questions was provided.

## 2018-11-21 NOTE — Anesthesia Preprocedure Evaluation (Deleted)
Anesthesia Evaluation    Airway        Dental   Pulmonary           Cardiovascular hypertension,      Neuro/Psych    GI/Hepatic   Endo/Other    Renal/GU      Musculoskeletal   Abdominal   Peds  Hematology   Anesthesia Other Findings   Reproductive/Obstetrics                             Anesthesia Physical Anesthesia Plan  ASA:   Anesthesia Plan:    Post-op Pain Management:    Induction:   PONV Risk Score and Plan:   Airway Management Planned:   Additional Equipment:   Intra-op Plan:   Post-operative Plan:   Informed Consent:   Plan Discussed with:   Anesthesia Plan Comments: Richard Velazquez(James Burns, PA-C asked to review chart due to pt concern for interaction between antiretroviral meds and anesthesia meds. Review of pt medications shows possible interaction between atazanavir with PPIs. Per PAT nurse note the pt has been instructed by PCP not to take Evotaz on DOS.)        Anesthesia Quick Evaluation

## 2018-11-23 LAB — HIV-1 RNA, QUALITATIVE, TMA: HIV-1 RNA, Qualitative, TMA: NEGATIVE

## 2018-11-28 ENCOUNTER — Inpatient Hospital Stay (HOSPITAL_COMMUNITY): Admission: RE | Admit: 2018-11-28 | Payer: Medicare HMO | Source: Ambulatory Visit | Admitting: General Surgery

## 2018-11-28 ENCOUNTER — Encounter (HOSPITAL_COMMUNITY): Admission: RE | Payer: Self-pay | Source: Ambulatory Visit

## 2018-11-28 DIAGNOSIS — M47816 Spondylosis without myelopathy or radiculopathy, lumbar region: Secondary | ICD-10-CM | POA: Diagnosis not present

## 2018-11-28 DIAGNOSIS — M545 Low back pain: Secondary | ICD-10-CM | POA: Diagnosis not present

## 2018-11-28 SURGERY — COLECTOMY, SIGMOID, LAPAROSCOPIC
Anesthesia: General

## 2018-11-29 ENCOUNTER — Encounter: Payer: Self-pay | Admitting: Family Medicine

## 2018-12-04 DIAGNOSIS — M47816 Spondylosis without myelopathy or radiculopathy, lumbar region: Secondary | ICD-10-CM | POA: Diagnosis not present

## 2018-12-06 DIAGNOSIS — M47816 Spondylosis without myelopathy or radiculopathy, lumbar region: Secondary | ICD-10-CM | POA: Diagnosis not present

## 2018-12-11 DIAGNOSIS — M47816 Spondylosis without myelopathy or radiculopathy, lumbar region: Secondary | ICD-10-CM | POA: Diagnosis not present

## 2018-12-13 DIAGNOSIS — M47816 Spondylosis without myelopathy or radiculopathy, lumbar region: Secondary | ICD-10-CM | POA: Diagnosis not present

## 2018-12-18 DIAGNOSIS — M47816 Spondylosis without myelopathy or radiculopathy, lumbar region: Secondary | ICD-10-CM | POA: Diagnosis not present

## 2018-12-20 DIAGNOSIS — M47816 Spondylosis without myelopathy or radiculopathy, lumbar region: Secondary | ICD-10-CM | POA: Diagnosis not present

## 2018-12-25 DIAGNOSIS — M47816 Spondylosis without myelopathy or radiculopathy, lumbar region: Secondary | ICD-10-CM | POA: Diagnosis not present

## 2019-01-12 NOTE — Pre-Procedure Instructions (Signed)
Quasean Hoy Morn  01/12/2019      [name] #[id number] Ginette Otto, [place] - [address] AT [facility] OF [address] & [address] [place] [address] Phone: [phone] Fax: [phone]    Your procedure is scheduled on January 23, 2019.  Report to [facility] Entrance "A" at 700 AM.  Call this number if you have problems the morning of surgery:  [phone]   Remember:  Do not eat after midnight.  You may drink clear liquids until 6:00 AM .  Clear liquids allowed are:  Ensure Pre-surgery drink, Water, Juice (non-citric and without pulp), Clear Tea, Black Coffee only and Gatorade  Please complete your day of surgery PRE-SURGERY ENSURE that was provided to you 3 hours prior to you surgery start time -6:00 AM. Please, if able, drink it in one setting. DO NOT SIP.    Take these medicines the morning of surgery with A SIP OF WATER  buPROPion (WELLBUTRIN SR) lamivudine (EPIVIR) Tenofovir Alafenamide Fumarate (VEMLIDY LORazepam (ATIVAN) -if needed  7 days prior to surgery STOP taking any Aspirin (unless otherwise instructed by your surgeon), Aleve, Naproxen, Ibuprofen, Motrin, Advil, Goody's, BC's, all herbal medications, fish oil, and all vitamins    Do not wear jewelry  Do not wear lotions, powders, or colognes, or deodorant.  Men may shave face and neck.  Do not bring valuables to the hospital.  [facility] is not responsible for any belongings or valuables.  Contacts, dentures or bridgework may not be worn into surgery.  Leave your suitcase in the car.  After surgery it may be brought to your room.  For patients admitted to the hospital, discharge time will be determined by your treatment team.  Patients discharged the day of surgery will not be allowed to drive home.    [place]- Preparing For Surgery  Before surgery, you can play an important role. Because skin is not sterile, your skin needs to be as free of germs as possible.  You can reduce the number of germs on your skin by washing with CHG (chlorahexidine gluconate) Soap before surgery.  CHG is an antiseptic cleaner which kills germs and bonds with the skin to continue killing germs even after washing.    Oral Hygiene is also important to reduce your risk of infection.  Remember - BRUSH YOUR TEETH THE MORNING OF SURGERY WITH YOUR REGULAR TOOTHPASTE  Please do not use if you have an allergy to CHG or antibacterial soaps. If your skin becomes reddened/irritated stop using the CHG.  Do not shave (including legs and underarms) for at least 48 hours prior to first CHG shower. It is OK to shave your face.  Please follow these instructions carefully.   1. Shower the NIGHT BEFORE SURGERY and the MORNING OF SURGERY with CHG.   2. If you chose to wash your hair, wash your hair first as usual with your normal shampoo.  3. After you shampoo, rinse your hair and body thoroughly to remove the shampoo.  4. Use CHG as you would any other liquid soap. You can apply CHG directly to the skin and wash gently with a scrungie or a clean washcloth.   5. Apply the CHG Soap to your body ONLY FROM THE NECK DOWN.  Do not use on open wounds or open sores. Avoid contact with your eyes, ears, mouth and genitals (private parts). Wash Face and genitals (private parts)  with your normal soap.  6.  Wash thoroughly, paying special attention to the area where your surgery will be performed.  7. Thoroughly rinse your body with warm water from the neck down.  8. DO NOT shower/wash with your normal soap after using and rinsing off the CHG Soap.  9. Pat yourself dry with a CLEAN TOWEL.  10. Wear CLEAN PAJAMAS to bed the night before surgery, wear comfortable clothes the morning of surgery  11. Place CLEAN SHEETS on your bed the night of your first shower and DO NOT SLEEP WITH PETS.  Day of Surgery:  Do not apply any deodorants/lotions.  Please wear clean clothes to the hospital/surgery  center.   Remember to brush your teeth WITH YOUR REGULAR TOOTHPASTE.  Please read over the following fact sheets that you were given.

## 2019-01-15 ENCOUNTER — Encounter (HOSPITAL_COMMUNITY): Payer: Self-pay

## 2019-01-15 ENCOUNTER — Encounter (HOSPITAL_COMMUNITY)
Admission: RE | Admit: 2019-01-15 | Discharge: 2019-01-15 | Disposition: A | Discharge status: Home / Self Care | Payer: Medicare HMO | Source: Ambulatory Visit | Attending: General Surgery | Admitting: General Surgery

## 2019-01-15 DIAGNOSIS — Z01812 Encounter for preprocedural laboratory examination: Secondary | ICD-10-CM | POA: Insufficient documentation

## 2019-01-15 LAB — CBC
HCT: 43.8 % (ref 39.0–52.0)
Hemoglobin: 15 g/dL (ref 13.0–17.0)
MCH: 31.8 pg (ref 26.0–34.0)
MCHC: 34.2 g/dL (ref 30.0–36.0)
MCV: 93 fL (ref 80.0–100.0)
Platelets: 364 10*3/uL (ref 150–400)
RBC: 4.71 MIL/uL (ref 4.22–5.81)
RDW: 11.9 % (ref 11.5–15.5)
WBC: 6.7 10*3/uL (ref 4.0–10.5)
nRBC: 0 % (ref 0.0–0.2)

## 2019-01-15 LAB — COMPREHENSIVE METABOLIC PANEL
ALT: 14 U/L (ref 0–44)
ANION GAP: 8 (ref 5–15)
AST: 43 U/L — ABNORMAL HIGH (ref 15–41)
Albumin: 3.7 g/dL (ref 3.5–5.0)
Alkaline Phosphatase: 66 U/L (ref 38–126)
BUN: 15 mg/dL (ref 8–23)
CO2: 25 mmol/L (ref 22–32)
Calcium: 9.2 mg/dL (ref 8.9–10.3)
Chloride: 104 mmol/L (ref 98–111)
Creatinine, Ser: 1.11 mg/dL (ref 0.61–1.24)
GFR calc Af Amer: >60 mL/min (ref >60)
GFR calc non Af Amer: >60 mL/min (ref >60)
Glucose, Bld: 91 mg/dL (ref 70–99)
Potassium: 4.8 mmol/L (ref 3.5–5.1)
Sodium: 137 mmol/L (ref 135–145)
Total Bilirubin: 2.7 mg/dL — ABNORMAL HIGH (ref 0.3–1.2)
Total Protein: 5.9 g/dL — ABNORMAL LOW (ref 6.5–8.1)

## 2019-01-15 MED ORDER — CHLORHEXIDINE GLUCONATE CLOTH 2 % EX PADS: 6.0000 | MEDICATED_PAD | Freq: Once | CUTANEOUS | Status: DC

## 2019-01-15 NOTE — Progress Notes (Signed)
PCP - Dr Rodman Key  At [facility]    Chest x-ray - none EKG - 11/10/18 Stress Test - none h -   Sleep Study - neg CPAP -   Fasting Blood Sugar - na Checks Blood Sugar _____ times a day    Anesthesia review: pt was instructed to stop evotaz (hiv med)  Patient denies shortness of breath, fever, cough and chest pain at PAT appointment   Patient verbalized understanding of instructions that were given to them at the PAT appointment. Patient was also instructed that they will need to review over the PAT instructions again at home before surgery.

## 2019-01-16 DIAGNOSIS — K5732 Diverticulitis of large intestine without perforation or abscess without bleeding: Secondary | ICD-10-CM | POA: Diagnosis not present

## 2019-01-16 DIAGNOSIS — M545 Low back pain: Secondary | ICD-10-CM | POA: Diagnosis not present

## 2019-01-16 NOTE — Anesthesia Preprocedure Evaluation (Addendum)
Anesthesia Evaluation  Patient identified by MRN, date of birth, ID band Patient awake    Reviewed: Allergy & Precautions, NPO status , Patient's Chart, lab work & pertinent test results  History of Anesthesia Complications Negative for: history of anesthetic complications  Airway Mallampati: II  TM Distance: >3 FB Neck ROM: Full    Dental  (+) Dental Advisory Given, Poor Dentition, Missing,    Pulmonary neg pulmonary ROS,    Pulmonary exam normal breath sounds clear to auscultation       Cardiovascular hypertension, Pt. on medications Normal cardiovascular exam Rhythm:Regular Rate:Normal     Neuro/Psych Anxiety Depression negative neurological ROS     GI/Hepatic Neg liver ROS, GERD  Controlled,  Endo/Other  negative endocrine ROS  Renal/GU negative Renal ROS     Musculoskeletal  (+) Arthritis ,   Abdominal   Peds  Hematology  (+) HIV,   Anesthesia Other Findings Day of surgery medications reviewed with the patient.  Reproductive/Obstetrics                          Anesthesia Physical Anesthesia Plan  ASA: II  Anesthesia Plan: General   Post-op Pain Management:    Induction: Intravenous  PONV Risk Score and Plan: 3 and Treatment may vary due to age or medical condition, Ondansetron, Dexamethasone and Midazolam  Airway Management Planned: Oral ETT  Additional Equipment:   Intra-op Plan:   Post-operative Plan: Extubation in OR  Informed Consent: I have reviewed the patients History and Physical, chart, labs and discussed the procedure including the risks, benefits and alternatives for the proposed anesthesia with the patient or authorized representative who has indicated his/her understanding and acceptance.     Dental advisory given  Plan Discussed with: CRNA  Anesthesia Plan Comments: (Reviewed chart due to pt concern for interaction between antiretroviral meds and  anesthesia meds. Review of pt medications shows possible interaction between atazanavir with PPIs. Per PAT nurse note the pt has been instructed by PCP not to take this medication on DOS.)      Anesthesia Quick Evaluation

## 2019-01-22 NOTE — H&P (Signed)
Cristy Hilts Documented: 01/16/2019 4:02 PM Location: [facility] Patient #: [id number] DOB: [birth year] Single / Language: [place] / Race: White Male   History of Present Illness [name] MD; 01/16/2019 4:36 PM) The patient is a 63 year old male who presents for a follow-up for Diverticulitis. Patient is a 63 year old male referred for consultation by Dr. Elnoria Howard for a diagnosis of recurrent sigmoid diverticulitis Fall 2019. The patient reports that he has first episode approximately 25 years ago. That resolved and he had an episode around every 3 years for a few decades. However, he had 3 episodes in 2019. He has had to go to the emergency department once or twice, but has not had to be hospitalized. His last episode was much more severe and he had a CT scan that confirmed the diagnosis. He has not had an abscess or any evidence of microperforation. His last colonoscopy was in October 2017. The patient has a history of well-controlled HIV. He is not on any blood thinners. He has no history of heart attack or stroke. He gets around very well. He has just come back from a 68-month trip to [place].  The patient was scheduled for surgery in November, but had to cancel. He is rescheduled for next week and comes back in to discuss. He has a bad back and wants to review what strategies can be taken to try to avoid this being set off. He has had h/o two disc ruptures in his lumbar spine and has some spinal stenosis.   CT abd/pelvis 05/18/2018 IMPRESSION: 1. Distal descending diverticulitis without abscess. 2. Stable patent cysts. 3. No renal mass or nephrolithiasis to suggest etiology of microscopic hematuria. There is marked prostatic enlargement.   Allergies (April Staton, CMA; 01/16/2019 4:03 PM) Levaquin *FLUOROQUINOLONES*  Clindamycin HCl (Bulk) *CHEMICALS*  AndroGel *ANDROGENS-ANABOLIC*  traZODone HCl *ANTIDEPRESSANTS*  Cymbalta *ANTIDEPRESSANTS*   Medication  History (April Staton, CMA; 01/16/2019 4:03 PM) Neomycin Sulfate (500MG  Tablet, 2 (two) Oral SEE NOTE, Taken starting 10/13/2018) Active. (TAKE TWO TABLETS AT 2 PM, 3 PM, AND 10 PM THE DAY PRIOR TO SURGERY) Flagyl (500MG  Tablet, 2 (two) Oral SEE NOTE, Taken starting 10/13/2018) Active. (Take at 2pm, 3pm, and 10pm the day prior to your colon operation) Vitamin C (500MG  Capsule, Oral) Active. Vitamin E (400UNIT Capsule, Oral) Active. Vitamin B12 ( Tablet ER, Oral) Active. buPROPion HCl ER (SR) (150MG  Tablet ER 12HR, Oral) Active. Vemlidy (25MG  Tablet, Oral) Active. Evotaz (300-150MG  Tablet, Oral) Active. lamiVUDine (300MG  Tablet, Oral) Active. Omega-3 Fatty Acids (900MG  Capsule, Oral) Active. Ester C (Oral) Active. Lisinopril (10MG  Tablet, Oral) Active. Rapaflo (8MG  Capsule, Oral) Active. LORazepam (1MG  Tablet, Oral) Active. Lomotil (0.025-2.5MG /5ML Liquid, Oral) Active. Ibuprofen (200MG  Capsule, Oral) Active. Medications Reconciled    Review of Systems [name] MD; 01/16/2019 4:36 PM) All other systems negative  Vitals (April Staton CMA; 01/16/2019 4:03 PM) 01/16/2019 4:03 PM Weight: 159.5 lb Height: 70.5in Body Surface Area: 1.91 m Body Mass Index: 22.56 kg/m  Temp.: 97.19F(Oral)  Pulse: 86 (Regular)  BP: 118/88 (Sitting, Left Arm, Standard)       Physical Exam [name] MD; 01/16/2019 4:36 PM) General Mental Status-Alert. General Appearance-Consistent with stated age. Hydration-Well hydrated. Voice-Normal.  Head and Neck Head-normocephalic, atraumatic with no lesions or palpable masses.  Eye Sclera/Conjunctiva - Bilateral-No scleral icterus.  Chest and Lung Exam Chest and lung exam reveals -quiet, even and easy respiratory effort with no use of accessory muscles. Inspection Chest Wall - Normal. Back - normal.  Breast -  Did not examine.  Cardiovascular Cardiovascular examination reveals -normal pedal  pulses bilaterally. Note: regular rate and rhythm  Abdomen Inspection-Inspection Normal. Palpation/Percussion Palpation and Percussion of the abdomen reveal - Soft, Non Tender, No Rebound tenderness, No Rigidity (guarding) and No hepatosplenomegaly.  Peripheral Vascular Upper Extremity Inspection - Bilateral - Normal - No Clubbing, No Cyanosis, No Edema, Pulses Intact. Lower Extremity Palpation - Edema - Bilateral - No edema.  Neurologic Neurologic evaluation reveals -alert and oriented x 3 with no impairment of recent or remote memory. Mental Status-Normal.  Musculoskeletal Global Assessment -Note: no gross deformities.  Normal Exam - Left-Upper Extremity Strength Normal and Lower Extremity Strength Normal. Normal Exam - Right-Upper Extremity Strength Normal and Lower Extremity Strength Normal.  Lymphatic Head & Neck  General Head & Neck Lymphatics: Bilateral - Description - Normal. Axillary  General Axillary Region: Bilateral - Description - Normal. Tenderness - Non Tender.    Assessment & Plan [name] MD; 01/16/2019 4:38 PM) SIGMOID DIVERTICULITIS (K57.32) Impression: Surgery planned next week.  Reviewed surgery.  Pt affirms that he has bowel prep and antibiotics. Current Plans Instructed to keep follow-up appointment as scheduled LOW BACK PAIN (M54.5) Impression: Discussed that we will make all efforts to keep his knees propped up post op. During surgery he will be in lithotomy wtih his knees up. Also discussed early mobility.    Signed by [name], MD (01/16/2019 4:38 PM)

## 2019-01-23 ENCOUNTER — Inpatient Hospital Stay (HOSPITAL_COMMUNITY)
Admission: RE | Admit: 2019-01-23 | Discharge: 2019-01-27 | DRG: 331 | Disposition: A | Discharge status: Home / Self Care | Payer: Medicare HMO | Attending: General Surgery | Admitting: General Surgery

## 2019-01-23 ENCOUNTER — Inpatient Hospital Stay (HOSPITAL_COMMUNITY): Payer: Medicare HMO | Admitting: Vascular Surgery

## 2019-01-23 ENCOUNTER — Inpatient Hospital Stay (HOSPITAL_COMMUNITY): Payer: Medicare HMO | Admitting: Anesthesiology

## 2019-01-23 ENCOUNTER — Encounter (HOSPITAL_COMMUNITY): Payer: Self-pay

## 2019-01-23 ENCOUNTER — Other Ambulatory Visit: Payer: Self-pay

## 2019-01-23 ENCOUNTER — Encounter (HOSPITAL_COMMUNITY)
Admission: RE | Disposition: A | Discharge status: Home / Self Care | Payer: Self-pay | Source: Home / Self Care | Attending: General Surgery

## 2019-01-23 DIAGNOSIS — Z79899 Other long term (current) drug therapy: Secondary | ICD-10-CM | POA: Diagnosis not present

## 2019-01-23 DIAGNOSIS — M545 Low back pain: Secondary | ICD-10-CM | POA: Diagnosis present

## 2019-01-23 DIAGNOSIS — I10 Essential (primary) hypertension: Secondary | ICD-10-CM | POA: Diagnosis not present

## 2019-01-23 DIAGNOSIS — R339 Retention of urine, unspecified: Secondary | ICD-10-CM | POA: Diagnosis present

## 2019-01-23 DIAGNOSIS — K5732 Diverticulitis of large intestine without perforation or abscess without bleeding: Secondary | ICD-10-CM | POA: Diagnosis present

## 2019-01-23 DIAGNOSIS — N400 Enlarged prostate without lower urinary tract symptoms: Secondary | ICD-10-CM | POA: Diagnosis not present

## 2019-01-23 DIAGNOSIS — Z21 Asymptomatic human immunodeficiency virus [HIV] infection status: Secondary | ICD-10-CM | POA: Diagnosis not present

## 2019-01-23 DIAGNOSIS — K5720 Diverticulitis of large intestine with perforation and abscess without bleeding: Secondary | ICD-10-CM | POA: Diagnosis not present

## 2019-01-23 DIAGNOSIS — R69 Illness, unspecified: Secondary | ICD-10-CM | POA: Diagnosis not present

## 2019-01-23 DIAGNOSIS — M1990 Unspecified osteoarthritis, unspecified site: Secondary | ICD-10-CM | POA: Diagnosis not present

## 2019-01-23 HISTORY — PX: LAPAROSCOPIC SIGMOID COLECTOMY: SHX5928

## 2019-01-23 LAB — CREATININE, SERUM
Creatinine, Ser: 1.13 mg/dL (ref 0.61–1.24)
GFR calc Af Amer: >60 mL/min (ref >60)
GFR calc non Af Amer: >60 mL/min (ref >60)

## 2019-01-23 LAB — CBC
HCT: 41.8 % (ref 39.0–52.0)
HEMOGLOBIN: 13.8 g/dL (ref 13.0–17.0)
MCH: 30.7 pg (ref 26.0–34.0)
MCHC: 33 g/dL (ref 30.0–36.0)
MCV: 92.9 fL (ref 80.0–100.0)
Platelets: 188 10*3/uL (ref 150–400)
RBC: 4.5 MIL/uL (ref 4.22–5.81)
RDW: 11.6 % (ref 11.5–15.5)
WBC: 11.9 10*3/uL — ABNORMAL HIGH (ref 4.0–10.5)
nRBC: 0 % (ref 0.0–0.2)

## 2019-01-23 SURGERY — COLECTOMY, SIGMOID, LAPAROSCOPIC
Anesthesia: General | Site: Abdomen

## 2019-01-23 MED ORDER — SUGAMMADEX SODIUM 200 MG/2ML IV SOLN
INTRAVENOUS | Status: DC | PRN
  Administered 2019-01-23: 180 mg via INTRAVENOUS

## 2019-01-23 MED ORDER — SODIUM CHLORIDE 0.9 % IV SOLN
INTRAVENOUS | Status: DC | PRN
  Administered 2019-01-23: 25 ug/min via INTRAVENOUS

## 2019-01-23 MED ORDER — TENOFOVIR ALAFENAMIDE FUMARATE 25 MG PO TABS: 25.0000 mg | ORAL_TABLET | Freq: Every day | ORAL | Status: DC

## 2019-01-23 MED ORDER — PROCHLORPERAZINE MALEATE 10 MG PO TABS
10.0000 mg | ORAL_TABLET | Freq: Four times a day (QID) | ORAL | Status: DC | PRN
  Administered 2019-01-25: 10 mg via ORAL
  Filled 2019-01-23 (×2): qty 1

## 2019-01-23 MED ORDER — ACETAMINOPHEN 500 MG PO TABS
1000.0000 mg | ORAL_TABLET | Freq: Four times a day (QID) | ORAL | Status: DC
  Administered 2019-01-23 – 2019-01-27 (×9): 1000 mg via ORAL
  Filled 2019-01-23 (×12): qty 2

## 2019-01-23 MED ORDER — FENTANYL CITRATE (PF) 250 MCG/5ML IJ SOLN
INTRAMUSCULAR | Status: AC
  Filled 2019-01-23: qty 5

## 2019-01-23 MED ORDER — DIPHENHYDRAMINE HCL 12.5 MG/5ML PO ELIX: 12.5000 mg | ORAL_SOLUTION | Freq: Four times a day (QID) | ORAL | Status: DC | PRN

## 2019-01-23 MED ORDER — 0.9 % SODIUM CHLORIDE (POUR BTL) OPTIME
TOPICAL | Status: DC | PRN
  Administered 2019-01-23: 1000 mL

## 2019-01-23 MED ORDER — ALVIMOPAN 12 MG PO CAPS
12.0000 mg | ORAL_CAPSULE | Freq: Two times a day (BID) | ORAL | Status: DC
  Administered 2019-01-24: 12 mg via ORAL
  Filled 2019-01-23 (×2): qty 1

## 2019-01-23 MED ORDER — BUPIVACAINE HCL (PF) 0.25 % IJ SOLN
INTRAMUSCULAR | Status: AC
  Filled 2019-01-23: qty 30

## 2019-01-23 MED ORDER — ALUM & MAG HYDROXIDE-SIMETH 200-200-20 MG/5ML PO SUSP
30.0000 mL | Freq: Four times a day (QID) | ORAL | Status: DC | PRN
  Administered 2019-01-24: 30 mL via ORAL
  Filled 2019-01-23: qty 30

## 2019-01-23 MED ORDER — LAMIVUDINE 150 MG PO TABS
300.0000 mg | ORAL_TABLET | Freq: Every day | ORAL | Status: DC
  Administered 2019-01-24 – 2019-01-27 (×4): 300 mg via ORAL
  Filled 2019-01-23 (×5): qty 2

## 2019-01-23 MED ORDER — SODIUM CHLORIDE 0.9 % IR SOLN
Status: DC | PRN
  Administered 2019-01-23: 1000 mL

## 2019-01-23 MED ORDER — METOPROLOL TARTRATE 5 MG/5ML IV SOLN: 5.0000 mg | Freq: Four times a day (QID) | INTRAVENOUS | Status: DC | PRN

## 2019-01-23 MED ORDER — SODIUM CHLORIDE 0.9 % IV SOLN
2.0000 g | Freq: Two times a day (BID) | INTRAVENOUS | Status: AC
  Administered 2019-01-23: 2 g via INTRAVENOUS
  Filled 2019-01-23 (×2): qty 2

## 2019-01-23 MED ORDER — BUPROPION HCL ER (SR) 150 MG PO TB12
150.0000 mg | ORAL_TABLET | Freq: Two times a day (BID) | ORAL | Status: DC
  Administered 2019-01-24 – 2019-01-27 (×5): 150 mg via ORAL
  Filled 2019-01-23 (×8): qty 1

## 2019-01-23 MED ORDER — LIDOCAINE-EPINEPHRINE 1 %-1:100000 IJ SOLN
INTRAMUSCULAR | Status: AC
  Filled 2019-01-23: qty 1

## 2019-01-23 MED ORDER — LORAZEPAM 1 MG PO TABS
1.0000 mg | ORAL_TABLET | Freq: Every day | ORAL | Status: DC
  Filled 2019-01-23 (×2): qty 1

## 2019-01-23 MED ORDER — ATAZANAVIR-COBICISTAT 300-150 MG PO TABS
1.0000 | ORAL_TABLET | Freq: Every day | ORAL | Status: DC
  Administered 2019-01-24 – 2019-01-27 (×4): 1 via ORAL
  Filled 2019-01-23 (×5): qty 1

## 2019-01-23 MED ORDER — ENSURE SURGERY PO LIQD
237.0000 mL | Freq: Two times a day (BID) | ORAL | Status: DC
  Administered 2019-01-23 – 2019-01-27 (×7): 237 mL via ORAL
  Filled 2019-01-23 (×11): qty 237

## 2019-01-23 MED ORDER — CELECOXIB 200 MG PO CAPS
200.0000 mg | ORAL_CAPSULE | Freq: Two times a day (BID) | ORAL | Status: DC
  Administered 2019-01-23 – 2019-01-27 (×9): 200 mg via ORAL
  Filled 2019-01-23 (×9): qty 1

## 2019-01-23 MED ORDER — TAMSULOSIN HCL 0.4 MG PO CAPS
0.4000 mg | ORAL_CAPSULE | Freq: Every day | ORAL | Status: DC
  Administered 2019-01-23 – 2019-01-26 (×4): 0.4 mg via ORAL
  Filled 2019-01-23 (×4): qty 1

## 2019-01-23 MED ORDER — ZOLPIDEM TARTRATE 5 MG PO TABS: 5.0000 mg | ORAL_TABLET | Freq: Every evening | ORAL | Status: DC | PRN

## 2019-01-23 MED ORDER — ALVIMOPAN 12 MG PO CAPS
12.0000 mg | ORAL_CAPSULE | ORAL | Status: AC
  Administered 2019-01-23: 12 mg via ORAL

## 2019-01-23 MED ORDER — PHENYLEPHRINE 40 MCG/ML (10ML) SYRINGE FOR IV PUSH (FOR BLOOD PRESSURE SUPPORT)
PREFILLED_SYRINGE | INTRAVENOUS | Status: DC | PRN
  Administered 2019-01-23: 120 ug via INTRAVENOUS

## 2019-01-23 MED ORDER — DEXAMETHASONE SODIUM PHOSPHATE 10 MG/ML IJ SOLN
INTRAMUSCULAR | Status: DC | PRN
  Administered 2019-01-23: 4 mg via INTRAVENOUS

## 2019-01-23 MED ORDER — LIDOCAINE 2% (20 MG/ML) 5 ML SYRINGE
INTRAMUSCULAR | Status: AC
  Filled 2019-01-23: qty 5

## 2019-01-23 MED ORDER — HYDROMORPHONE HCL 1 MG/ML IJ SOLN: 0.5000 mg | INTRAMUSCULAR | Status: DC | PRN

## 2019-01-23 MED ORDER — HYDROMORPHONE HCL 1 MG/ML IJ SOLN: 0.2500 mg | INTRAMUSCULAR | Status: DC | PRN

## 2019-01-23 MED ORDER — ROCURONIUM BROMIDE 50 MG/5ML IV SOSY
PREFILLED_SYRINGE | INTRAVENOUS | Status: AC
  Filled 2019-01-23: qty 5

## 2019-01-23 MED ORDER — ONDANSETRON HCL 4 MG/2ML IJ SOLN
INTRAMUSCULAR | Status: AC
  Filled 2019-01-23: qty 2

## 2019-01-23 MED ORDER — LISINOPRIL 10 MG PO TABS
10.0000 mg | ORAL_TABLET | Freq: Every day | ORAL | Status: DC
  Administered 2019-01-26: 10 mg via ORAL
  Administered 2019-01-27: 5 mg via ORAL
  Filled 2019-01-23 (×4): qty 1

## 2019-01-23 MED ORDER — DEXAMETHASONE SODIUM PHOSPHATE 10 MG/ML IJ SOLN
INTRAMUSCULAR | Status: AC
  Filled 2019-01-23: qty 1

## 2019-01-23 MED ORDER — BUPIVACAINE LIPOSOME 1.3 % IJ SUSP
20.0000 mL | INTRAMUSCULAR | Status: AC
  Administered 2019-01-23: 20 mL
  Filled 2019-01-23: qty 20

## 2019-01-23 MED ORDER — CEFOTETAN DISODIUM-DEXTROSE 2-2.08 GM-%(50ML) IV SOLR
2.0000 g | Freq: Once | INTRAVENOUS | Status: AC
  Administered 2019-01-23: 2 g via INTRAVENOUS

## 2019-01-23 MED ORDER — GABAPENTIN 300 MG PO CAPS
300.0000 mg | ORAL_CAPSULE | Freq: Two times a day (BID) | ORAL | Status: DC
  Administered 2019-01-23 – 2019-01-27 (×9): 300 mg via ORAL
  Filled 2019-01-23 (×9): qty 1

## 2019-01-23 MED ORDER — PROMETHAZINE HCL 25 MG/ML IJ SOLN: 6.2500 mg | INTRAMUSCULAR | Status: DC | PRN

## 2019-01-23 MED ORDER — ALVIMOPAN 12 MG PO CAPS
ORAL_CAPSULE | ORAL | Status: AC
  Administered 2019-01-23: 12 mg via ORAL
  Filled 2019-01-23: qty 1

## 2019-01-23 MED ORDER — OXYCODONE HCL 5 MG/5ML PO SOLN: 5.0000 mg | Freq: Once | ORAL | Status: DC | PRN

## 2019-01-23 MED ORDER — FENTANYL CITRATE (PF) 250 MCG/5ML IJ SOLN
INTRAMUSCULAR | Status: DC | PRN
  Administered 2019-01-23: 50 ug via INTRAVENOUS
  Administered 2019-01-23: 100 ug via INTRAVENOUS

## 2019-01-23 MED ORDER — MIDAZOLAM HCL 2 MG/2ML IJ SOLN
INTRAMUSCULAR | Status: AC
  Filled 2019-01-23: qty 2

## 2019-01-23 MED ORDER — PROPOFOL 10 MG/ML IV BOLUS
INTRAVENOUS | Status: AC
  Filled 2019-01-23: qty 20

## 2019-01-23 MED ORDER — MIDAZOLAM HCL 2 MG/2ML IJ SOLN
INTRAMUSCULAR | Status: DC | PRN
  Administered 2019-01-23: 2 mg via INTRAVENOUS

## 2019-01-23 MED ORDER — OXYCODONE HCL 5 MG PO TABS
5.0000 mg | ORAL_TABLET | ORAL | Status: DC | PRN
  Administered 2019-01-24: 10 mg via ORAL
  Administered 2019-01-24: 5 mg via ORAL
  Filled 2019-01-23 (×2): qty 2

## 2019-01-23 MED ORDER — GABAPENTIN 300 MG PO CAPS
ORAL_CAPSULE | ORAL | Status: AC
  Administered 2019-01-23: 300 mg via ORAL
  Filled 2019-01-23: qty 1

## 2019-01-23 MED ORDER — TENOFOVIR DISOPROXIL FUMARATE 300 MG PO TABS
300.0000 mg | ORAL_TABLET | Freq: Every day | ORAL | Status: DC
  Administered 2019-01-24: 300 mg via ORAL
  Filled 2019-01-23: qty 1

## 2019-01-23 MED ORDER — VITAMIN C 500 MG PO TABS
500.0000 mg | ORAL_TABLET | Freq: Three times a day (TID) | ORAL | Status: DC
  Administered 2019-01-23 – 2019-01-27 (×12): 500 mg via ORAL
  Filled 2019-01-23 (×12): qty 1

## 2019-01-23 MED ORDER — BUPIVACAINE HCL (PF) 0.25 % IJ SOLN
INTRAMUSCULAR | Status: DC | PRN
  Administered 2019-01-23: 10 mL
  Administered 2019-01-23: 20 mL

## 2019-01-23 MED ORDER — ENOXAPARIN SODIUM 40 MG/0.4ML ~~LOC~~ SOLN
40.0000 mg | SUBCUTANEOUS | Status: DC
  Filled 2019-01-23 (×3): qty 0.4

## 2019-01-23 MED ORDER — ONDANSETRON HCL 4 MG/2ML IJ SOLN
INTRAMUSCULAR | Status: DC | PRN
  Administered 2019-01-23: 4 mg via INTRAVENOUS

## 2019-01-23 MED ORDER — ACETAMINOPHEN 500 MG PO TABS
1000.0000 mg | ORAL_TABLET | Freq: Once | ORAL | Status: AC
  Administered 2019-01-23: 1000 mg via ORAL

## 2019-01-23 MED ORDER — ONDANSETRON HCL 4 MG/2ML IJ SOLN: 4.0000 mg | Freq: Four times a day (QID) | INTRAMUSCULAR | Status: DC | PRN

## 2019-01-23 MED ORDER — GABAPENTIN 300 MG PO CAPS
300.0000 mg | ORAL_CAPSULE | Freq: Once | ORAL | Status: AC
  Administered 2019-01-23: 300 mg via ORAL

## 2019-01-23 MED ORDER — CEFOTETAN DISODIUM-DEXTROSE 2-2.08 GM-%(50ML) IV SOLR
INTRAVENOUS | Status: AC
  Filled 2019-01-23: qty 50

## 2019-01-23 MED ORDER — ONDANSETRON HCL 4 MG PO TABS: 4.0000 mg | ORAL_TABLET | Freq: Four times a day (QID) | ORAL | Status: DC | PRN

## 2019-01-23 MED ORDER — EPHEDRINE SULFATE 50 MG/ML IJ SOLN
INTRAMUSCULAR | Status: DC | PRN
  Administered 2019-01-23: 5 mg via INTRAVENOUS
  Administered 2019-01-23 (×2): 10 mg via INTRAVENOUS

## 2019-01-23 MED ORDER — METHOCARBAMOL 500 MG PO TABS
500.0000 mg | ORAL_TABLET | Freq: Four times a day (QID) | ORAL | Status: DC | PRN
  Filled 2019-01-23: qty 1

## 2019-01-23 MED ORDER — ACETAMINOPHEN 10 MG/ML IV SOLN: 1000.0000 mg | Freq: Once | INTRAVENOUS | Status: DC | PRN

## 2019-01-23 MED ORDER — OXYCODONE HCL 5 MG PO TABS: 5.0000 mg | ORAL_TABLET | Freq: Once | ORAL | Status: DC | PRN

## 2019-01-23 MED ORDER — DIPHENHYDRAMINE HCL 50 MG/ML IJ SOLN: 12.5000 mg | Freq: Four times a day (QID) | INTRAMUSCULAR | Status: DC | PRN

## 2019-01-23 MED ORDER — LACTATED RINGERS IV SOLN
INTRAVENOUS | Status: DC
  Administered 2019-01-23 (×2): via INTRAVENOUS

## 2019-01-23 MED ORDER — PROBIOTIC-10 ULTIMATE PO CAPS: ORAL_CAPSULE | Freq: Every day | ORAL | Status: DC

## 2019-01-23 MED ORDER — LIDOCAINE 2% (20 MG/ML) 5 ML SYRINGE
INTRAMUSCULAR | Status: DC | PRN
  Administered 2019-01-23: 100 mg via INTRAVENOUS

## 2019-01-23 MED ORDER — GLYCOPYRROLATE PF 0.2 MG/ML IJ SOSY
PREFILLED_SYRINGE | INTRAMUSCULAR | Status: DC | PRN
  Administered 2019-01-23: .2 mg via INTRAVENOUS

## 2019-01-23 MED ORDER — KCL IN DEXTROSE-NACL 20-5-0.45 MEQ/L-%-% IV SOLN
INTRAVENOUS | Status: AC
  Administered 2019-01-23 – 2019-01-26 (×6): via INTRAVENOUS
  Filled 2019-01-23 (×7): qty 1000

## 2019-01-23 MED ORDER — PROPOFOL 10 MG/ML IV BOLUS
INTRAVENOUS | Status: DC | PRN
  Administered 2019-01-23: 150 mg via INTRAVENOUS

## 2019-01-23 MED ORDER — VITAMIN B-12 1000 MCG PO TABS
2000.0000 ug | ORAL_TABLET | Freq: Every day | ORAL | Status: DC
  Administered 2019-01-23 – 2019-01-27 (×5): 2000 ug via ORAL
  Filled 2019-01-23 (×6): qty 2

## 2019-01-23 MED ORDER — ROCURONIUM BROMIDE 10 MG/ML (PF) SYRINGE
PREFILLED_SYRINGE | INTRAVENOUS | Status: DC | PRN
  Administered 2019-01-23: 10 mg via INTRAVENOUS
  Administered 2019-01-23: 20 mg via INTRAVENOUS
  Administered 2019-01-23: 30 mg via INTRAVENOUS
  Administered 2019-01-23: 50 mg via INTRAVENOUS

## 2019-01-23 MED ORDER — CLOBETASOL PROPIONATE 0.05 % EX OINT
1.0000 "application " | TOPICAL_OINTMENT | Freq: Two times a day (BID) | CUTANEOUS | Status: DC | PRN
  Filled 2019-01-23: qty 15

## 2019-01-23 MED ORDER — ACETAMINOPHEN 500 MG PO TABS
ORAL_TABLET | ORAL | Status: AC
  Administered 2019-01-23: 1000 mg via ORAL
  Filled 2019-01-23: qty 2

## 2019-01-23 MED ORDER — TRAMADOL HCL 50 MG PO TABS: 50.0000 mg | ORAL_TABLET | Freq: Four times a day (QID) | ORAL | Status: DC | PRN

## 2019-01-23 MED ORDER — PROCHLORPERAZINE EDISYLATE 10 MG/2ML IJ SOLN: 5.0000 mg | Freq: Four times a day (QID) | INTRAMUSCULAR | Status: DC | PRN

## 2019-01-23 SURGICAL SUPPLY — 83 items
APPLIER CLIP ROT 10 11.4 M/L (STAPLE)
BLADE CLIPPER SURG (BLADE) IMPLANT
CANISTER SUCT 3000ML PPV (MISCELLANEOUS) ×2 IMPLANT
CELLS DAT CNTRL 66122 CELL SVR (MISCELLANEOUS) IMPLANT
CHLORAPREP W/TINT 26ML (MISCELLANEOUS) ×2 IMPLANT
CLIP APPLIE ROT 10 11.4 M/L (STAPLE) IMPLANT
COVER MAYO STAND STRL (DRAPES) ×4 IMPLANT
COVER SURGICAL LIGHT HANDLE (MISCELLANEOUS) ×4 IMPLANT
COVER WAND RF STERILE (DRAPES) ×2 IMPLANT
DERMABOND ADVANCED (GAUZE/BANDAGES/DRESSINGS) ×1
DERMABOND ADVANCED .7 DNX12 (GAUZE/BANDAGES/DRESSINGS) IMPLANT
DRAPE HALF SHEET 40X57 (DRAPES) ×2 IMPLANT
DRAPE UTILITY XL STRL (DRAPES) ×3 IMPLANT
DRAPE WARM FLUID 44X44 (DRAPE) ×2 IMPLANT
DRSG OPSITE POSTOP 4X10 (GAUZE/BANDAGES/DRESSINGS) IMPLANT
DRSG OPSITE POSTOP 4X6 (GAUZE/BANDAGES/DRESSINGS) ×1 IMPLANT
DRSG OPSITE POSTOP 4X8 (GAUZE/BANDAGES/DRESSINGS) IMPLANT
ELECT BLADE 6.5 EXT (BLADE) ×2 IMPLANT
ELECT CAUTERY BLADE 6.4 (BLADE) ×4 IMPLANT
ELECT REM PT RETURN 9FT ADLT (ELECTROSURGICAL) ×2
ELECTRODE REM PT RTRN 9FT ADLT (ELECTROSURGICAL) ×1 IMPLANT
GAUZE 4X4 16PLY RFD (DISPOSABLE) ×1 IMPLANT
GEL ULTRASOUND 20GR AQUASONIC (MISCELLANEOUS) IMPLANT
GLOVE BIO SURGEON STRL SZ 6 (GLOVE) ×4 IMPLANT
GLOVE INDICATOR 6.5 STRL GRN (GLOVE) ×4 IMPLANT
GOWN STRL REUS W/ TWL XL LVL3 (GOWN DISPOSABLE) ×4 IMPLANT
GOWN STRL REUS W/TWL 2XL LVL3 (GOWN DISPOSABLE) ×4 IMPLANT
GOWN STRL REUS W/TWL XL LVL3 (GOWN DISPOSABLE) ×4
KIT BASIN OR (CUSTOM PROCEDURE TRAY) ×2 IMPLANT
KIT TURNOVER KIT B (KITS) ×2 IMPLANT
L-HOOK LAP DISP 36CM (ELECTROSURGICAL) ×2
LEGGING LITHOTOMY PAIR STRL (DRAPES) ×3 IMPLANT
LHOOK LAP DISP 36CM (ELECTROSURGICAL) ×1 IMPLANT
LIGASURE IMPACT 36 18CM CVD LR (INSTRUMENTS) IMPLANT
LIGASURE MARYLAND LAP STAND (ELECTROSURGICAL) IMPLANT
LUBRICANT VIPERSLIDE CORONARY (MISCELLANEOUS) ×1 IMPLANT
NS IRRIG 1000ML POUR BTL (IV SOLUTION) ×4 IMPLANT
PAD ARMBOARD 7.5X6 YLW CONV (MISCELLANEOUS) ×4 IMPLANT
PENCIL BUTTON HOLSTER BLD 10FT (ELECTRODE) ×4 IMPLANT
RELOAD PROXIMATE 75MM BLUE (ENDOMECHANICALS) ×2 IMPLANT
RELOAD STAPLE 75 3.8 BLU REG (ENDOMECHANICALS) IMPLANT
RETRACTOR WND ALEXIS 18 MED (MISCELLANEOUS) IMPLANT
RTRCTR WOUND ALEXIS 18CM MED (MISCELLANEOUS)
SCISSORS LAP 5X35 DISP (ENDOMECHANICALS) ×2 IMPLANT
SEALER TISSUE G2 STRG ARTC 35C (ENDOMECHANICALS) ×2 IMPLANT
SET IRRIG TUBING LAPAROSCOPIC (IRRIGATION / IRRIGATOR) ×1 IMPLANT
SLEEVE ENDOPATH XCEL 5M (ENDOMECHANICALS) ×4 IMPLANT
SPECIMEN JAR LARGE (MISCELLANEOUS) ×2 IMPLANT
SPONGE LAP 18X18 RF (DISPOSABLE) IMPLANT
STAPLER CIRC ILS CVD 33MM 37CM (STAPLE) ×1 IMPLANT
STAPLER PROXIMATE 75MM BLUE (STAPLE) ×1 IMPLANT
STAPLER VISISTAT 35W (STAPLE) ×2 IMPLANT
SUCTION POOLE TIP (SUCTIONS) ×2 IMPLANT
SURGILUBE 2OZ TUBE FLIPTOP (MISCELLANEOUS) ×2 IMPLANT
SUT MNCRL AB 4-0 PS2 18 (SUTURE) ×2 IMPLANT
SUT PDS AB 1 CT  36 (SUTURE)
SUT PDS AB 1 CT 36 (SUTURE) IMPLANT
SUT PDS AB 1 TP1 54 (SUTURE) ×2 IMPLANT
SUT PROLENE 0 SH 30 (SUTURE) ×1 IMPLANT
SUT PROLENE 2 0 CT2 30 (SUTURE) IMPLANT
SUT PROLENE 2 0 KS (SUTURE) IMPLANT
SUT VIC AB 2-0 SH 18 (SUTURE) ×2 IMPLANT
SUT VIC AB 3-0 SH 18 (SUTURE) ×2 IMPLANT
SUT VIC AB 3-0 SH 27 (SUTURE) ×1
SUT VIC AB 3-0 SH 27X BRD (SUTURE) IMPLANT
SUT VICRYL 0 UR6 27IN ABS (SUTURE) ×1 IMPLANT
SUT VICRYL AB 2 0 TIES (SUTURE) ×2 IMPLANT
SUT VICRYL AB 3 0 TIES (SUTURE) ×2 IMPLANT
SYR BULB IRRIGATION 50ML (SYRINGE) ×2 IMPLANT
SYS LAPSCP GELPORT 120MM (MISCELLANEOUS) ×2
SYSTEM LAPSCP GELPORT 120MM (MISCELLANEOUS) IMPLANT
TOWEL OR 17X26 10 PK STRL BLUE (TOWEL DISPOSABLE) ×4 IMPLANT
TRAY FOLEY MTR SLVR 16FR STAT (SET/KITS/TRAYS/PACK) ×2 IMPLANT
TRAY LAPAROSCOPIC MC (CUSTOM PROCEDURE TRAY) ×2 IMPLANT
TRAY PROCTOSCOPIC FIBER OPTIC (SET/KITS/TRAYS/PACK) ×2 IMPLANT
TROCAR XCEL 12X100 BLDLESS (ENDOMECHANICALS) IMPLANT
TROCAR XCEL BLUNT TIP 100MML (ENDOMECHANICALS) IMPLANT
TROCAR XCEL NON-BLD 11X100MML (ENDOMECHANICALS) IMPLANT
TROCAR XCEL NON-BLD 5MMX100MML (ENDOMECHANICALS) ×2 IMPLANT
TUBE CONNECTING 12X1/4 (SUCTIONS) ×4 IMPLANT
TUBING INSUF HEATED (TUBING) ×2 IMPLANT
WATER STERILE IRR 1000ML POUR (IV SOLUTION) ×2 IMPLANT
YANKAUER SUCT BULB TIP NO VENT (SUCTIONS) ×4 IMPLANT

## 2019-01-23 NOTE — Interval H&P Note (Signed)
History and Physical Interval Note:  01/23/2019 8:41 AM  Richard Velazquez Morn  has presented today for surgery, with the diagnosis of recurrent sigmoid diverticulitis  The various methods of treatment have been discussed with the patient and family. After consideration of risks, benefits and other options for treatment, the patient has consented to  Procedure(s): LAPAROSCOPIC SIGMOID COLECTOMY ERAS PATHWAY (N/A) as a surgical intervention .  The patient's history has been reviewed, patient examined, no change in status, stable for surgery.  I have reviewed the patient's chart and labs.  Questions were answered to the patient's satisfaction.     [name]

## 2019-01-23 NOTE — Discharge Instructions (Signed)
[facility]      [facility] °[phone] ° °ABDOMINAL SURGERY: POST OP INSTRUCTIONS ° °Always review your discharge instruction sheet given to you by the facility where your surgery was performed. ° °IF YOU HAVE DISABILITY OR FAMILY LEAVE FORMS, YOU MUST BRING THEM TO THE OFFICE FOR PROCESSING.  PLEASE DO NOT GIVE THEM TO YOUR DOCTOR. ° °1. A prescription for pain medication may be given to you upon discharge.  Take your pain medication as prescribed, if needed.  If narcotic pain medicine is not needed, then you may take acetaminophen (Tylenol) or ibuprofen (Advil) as needed. °2. Take your usually prescribed medications unless otherwise directed. °3. If you need a refill on your pain medication, please contact your pharmacy. They will contact our office to request authorization.  Prescriptions will not be filled after 5pm or on week-ends. °4. You should follow a light diet the first few days after arrival home, such as soup and crackers, pudding, etc.unless your doctor has advised otherwise. A high-fiber, low fat diet can be resumed as tolerated.   Be sure to include lots of fluids daily. Most patients will experience some swelling and bruising on the chest and neck area.  Ice packs will help.  Swelling and bruising can take several days to resolve °5. Most patients will experience some swelling and bruising in the area of the incision. Ice pack will help. Swelling and bruising can take several days to resolve..  °6. It is common to experience some constipation if taking pain medication after surgery.  Increasing fluid intake and taking a stool softener will usually help or prevent this problem from occurring.  A mild laxative (Milk of Magnesia or Miralax) should be taken according to package directions if there are no bowel movements after 48 hours. °7.  You may have steri-strips (small skin tapes) in place directly over the incision.  These strips should be left on the skin for 10-14 days.  If your  surgeon used skin glue on the incision, you may shower in 48 hours.  The glue will flake off over the next 2-3 weeks.  Any sutures or staples will be removed at the office during your follow-up visit. You may find that a light gauze bandage over your incision may keep your staples from being rubbed or pulled. You may shower and replace the bandage daily. °8. ACTIVITIES:  You may resume regular (light) daily activities beginning the next day--such as daily self-care, walking, climbing stairs--gradually increasing activities as tolerated.  You may have sexual intercourse when it is comfortable.  Refrain from any heavy lifting or straining until approved by your doctor. °a. You may drive when you no longer are taking prescription pain medication, you can comfortably wear a seatbelt, and you can safely maneuver your car and apply brakes °b. Return to Work: __________8 weeks if applicable_________________________ °9. You should see your doctor in the office for a follow-up appointment approximately two weeks after your surgery.  Make sure that you call for this appointment within a day or two after you arrive home to insure a convenient appointment time. °OTHER INSTRUCTIONS:  °_____________________________________________________________ °_____________________________________________________________ ° °WHEN TO CALL YOUR DOCTOR: °1. Fever over 101.0 °2. Inability to urinate °3. Nausea and/or vomiting °4. Extreme swelling or bruising °5. Continued bleeding from incision. °6. Increased pain, redness, or drainage from the incision. °7. Difficulty swallowing or breathing °8. Muscle cramping or spasms. °9. Numbness or tingling in hands or feet or around lips. ° °The clinic staff is   available to answer your questions during regular business hours.  Please don’t hesitate to call and ask to speak to one of the nurses if you have concerns. ° °For further questions, please visit [email or web address] ° ° ° °

## 2019-01-23 NOTE — Progress Notes (Addendum)
No orders for day of surgery in computer.  Dr. Donell Beers made aware.  Verbal orders given for:  Cefotetan 2g Tylenol 1000mg  Gabapentin 300mg  Entereg 12mg 

## 2019-01-23 NOTE — Anesthesia Procedure Notes (Signed)
Procedure Name: Intubation Date/Time: 01/23/2019 9:41 AM Performed by: Brennan Bailey, MD Pre-anesthesia Checklist: Patient identified, Emergency Drugs available, Suction available and Patient being monitored Patient Re-evaluated:Patient Re-evaluated prior to induction Oxygen Delivery Method: Circle System Utilized Preoxygenation: Pre-oxygenation with 100% oxygen Induction Type: IV induction Ventilation: Mask ventilation without difficulty Laryngoscope Size: Mac and 4 Grade View: Grade II Tube type: Oral Number of attempts: 1 Airway Equipment and Method: Stylet and Oral airway Placement Confirmation: ETT inserted through vocal cords under direct vision,  positive ETCO2 and breath sounds checked- equal and bilateral Secured at: 22 cm Tube secured with: Tape Dental Injury: Teeth and Oropharynx as per pre-operative assessment

## 2019-01-23 NOTE — Transfer of Care (Signed)
Immediate Anesthesia Transfer of Care Note  Patient: Richard Velazquez  Procedure(s) Performed: LAPAROSCOPIC SIGMOID COLECTOMY ERAS PATHWAY (N/A Abdomen)  Patient Location: PACU  Anesthesia Type:General  Level of Consciousness: drowsy and patient cooperative  Airway & Oxygen Therapy: Patient Spontanous Breathing and Patient connected to nasal cannula oxygen  Post-op Assessment: Report given to RN and Post -op Vital signs reviewed and stable  Post vital signs: Reviewed and stable  Last Vitals:  Vitals Value Taken Time  BP 109/73 01/23/2019 12:34 PM  Temp    Pulse 74 01/23/2019 12:35 PM  Resp 14 01/23/2019 12:35 PM  SpO2 99 % 01/23/2019 12:35 PM  Vitals shown include unvalidated device data.  Last Pain:  Vitals:   01/23/19 0748  TempSrc:   PainSc: 3       Patients Stated Pain Goal: 2 (01/23/19 0748)  Complications: No apparent anesthesia complications

## 2019-01-23 NOTE — Anesthesia Postprocedure Evaluation (Signed)
Anesthesia Post Note  Patient: Richard Velazquez  Procedure(s) Performed: LAPAROSCOPIC SIGMOID COLECTOMY ERAS PATHWAY (N/A Abdomen)     Patient location during evaluation: PACU Anesthesia Type: General Level of consciousness: awake and alert Pain management: pain level controlled Vital Signs Assessment: post-procedure vital signs reviewed and stable Respiratory status: spontaneous breathing, nonlabored ventilation and respiratory function stable Cardiovascular status: blood pressure returned to baseline and stable Postop Assessment: no apparent nausea or vomiting Anesthetic complications: no    Last Vitals:  Vitals:   01/23/19 1305 01/23/19 1322  BP: 101/62 104/76  Pulse: 82 74  Resp: 18 18  Temp:  36.5 C  SpO2: 94% 93%    Last Pain:  Vitals:   01/23/19 1300  TempSrc:   PainSc: 0-No pain                 Kaylyn Layer

## 2019-01-23 NOTE — Op Note (Signed)
Laparoscopic Sigmoid Colectomy, takedown of splenic flexure  Indications: This patient presents for a laparoscopic partial colectomy for recurrent sigmoid diverticulitis  Pre-operative Diagnosis: recurrent sigmoid diverticulitis  Post-operative Diagnosis: Same  Surgeon: ZMOQHU,TMLYY   Assistants: Violeta Gelinas, MD Donnamarie Poag, RNFA  Anesthesia: General endotracheal anesthesia and Local anesthesia 1% plain lidocaine, 0.5% bupivacaine, with epinephrine  ASA Class: 2  Procedure Details  The patient was seen in the Holding Room. The risks, benefits, complications, treatment options, and expected outcomes were discussed with the patient. The possibilities of reaction to medication, perforation of viscus, bleeding, recurrent infection, finding a normal colon, the need for additional procedures, failure to diagnose a condition, and creating a complication requiring transfusion or operation were discussed with the patient. The patient was advised of the risk of ostomy.  The patient concurred with the proposed plan, giving informed consent.   The patient was taken to the operating room, identified, and the procedure verified as laparoscopic sigmoid colectomy. A Time Out was held and the above information confirmed.  The patient was brought to the operating room and placed into low lithotomy position. After induction of a general anesthetic, a Foley catheter was inserted and the abdomen was prepped and draped in standard fashion. The patient was then placed into reverse trendelenburg position and rotated to the right. A 5 mm Optiview trocar was placed at the left costal margin under direct visualization. Pneumoperitoneum was insufflated to a pressure of 15 mm Hg. The laparoscope was introduced.    Exploration revealed a normal omentum, small bowel, peritoneum, liver, and stomach. Three additional 5-mm trocars were then placed after anesthetizing the skin and peritoneum with Marcaine. These were located  in the midline supraumbilical location and in the RLQ x 2.  No abdominal wall adhesions were seen.  The sigmoid was adherent to the LLQ abdominal wall.     The sigmoid colon was dissected from the lateral abdominal wall sharply.  The descending colon and splenic flexure were then mobilized with gentle retraction of the colon in a medial direction with mobilization of the peritoneal reflection with cautery and the EnSeal. Mobilization of this area was complete to expose the retroperitoneum. The left colon was able to mobilize downward.   The colon was elevated and medial to lateral blunt dissection was used to open the mesocolon.  The left ureter was identified and avoided.  The superior hemorrhoidal artery was divided with the enseal.    After completing mobilization, the lower midline was opened with a #10 blade for the colon extraction port.  A gelport was placed in this location. The colon was divided at the proximal sigmoid with a GIA 75 mm stapler.   Once the sigmoid was fully mobilized, the proximal rectum/distal sigmoid junction was divided with the GIA 75 mm stapler.  A bowel clamp was placed proximally and the distal descending colon was divided sharply.  The sites of division were soft and uninflamed.  The specimen was submitted to pathology.      An end-to-end anastomosis was performed with the EEA stapling device. The proximal end was opened and the sizers used to determine which EEA to use.  The 33 mm stapler was selected.  A 0-0 prolene pursestring suture was placed around the anvil.  The length of the colon was good with no tension. This was placed back into the abdomen and the gelport cap placed.  The abdomen was reinsufflated.     The stapler was placed via the anus, the spike deployed,  and the stapler coupled without difficulty.  This was done with laparoscopic visualization.   The colon ends were pulled together with the stapler.  Pressure was held for 1 minute.  The stapler was fired and  pressure held for another 30 seconds.  The stapler was removed.  The anastamotic rings were robust and complete.  The proctoscope was used to insufflate the rectum to test the anastamosis.  No leak was seen.    No bleeding was seen.    The colon protocol was followed.  The abdomen was irrigated with antibiotic irrigation. The peritoneum was closed with 0-0 vicryl.  The fascia was closed with running #1 PDS suture.  The skin was irrigated.  Exparel/marcaine was infiltrated into the abdominal wall.  The skin of the lower midline extraction port was closed with 3-0 vicryl deep dermal sutures and running 4-0 subcuticular sutures.  The port sites were closed with 4-0 monocryl interrupted sutures and dermabond.  The midline wound was dressed with a honeycomb dressing.    Instrument, sponge, and needle counts were correct prior to abdominal closure and at the conclusion of the case.   Findings: inflamed distal sigmoid   Estimated Blood Loss: less than 50 mL             Specimens: sigmoid            Complications: None; patient tolerated the procedure well.         Disposition: PACU - hemodynamically stable.         Condition: stable

## 2019-01-24 ENCOUNTER — Encounter (HOSPITAL_COMMUNITY): Payer: Self-pay | Admitting: General Surgery

## 2019-01-24 LAB — CBC
HCT: 37.2 % — ABNORMAL LOW (ref 39.0–52.0)
Hemoglobin: 12.8 g/dL — ABNORMAL LOW (ref 13.0–17.0)
MCH: 31.8 pg (ref 26.0–34.0)
MCHC: 34.4 g/dL (ref 30.0–36.0)
MCV: 92.3 fL (ref 80.0–100.0)
Platelets: 186 10*3/uL (ref 150–400)
RBC: 4.03 MIL/uL — ABNORMAL LOW (ref 4.22–5.81)
RDW: 11.7 % (ref 11.5–15.5)
WBC: 10.8 10*3/uL — ABNORMAL HIGH (ref 4.0–10.5)
nRBC: 0 % (ref 0.0–0.2)

## 2019-01-24 LAB — BASIC METABOLIC PANEL
Anion gap: 5 (ref 5–15)
BUN: 13 mg/dL (ref 8–23)
CHLORIDE: 107 mmol/L (ref 98–111)
CO2: 24 mmol/L (ref 22–32)
Calcium: 8.9 mg/dL (ref 8.9–10.3)
Creatinine, Ser: 1.03 mg/dL (ref 0.61–1.24)
GFR calc Af Amer: >60 mL/min (ref >60)
GFR calc non Af Amer: >60 mL/min (ref >60)
Glucose, Bld: 156 mg/dL — ABNORMAL HIGH (ref 70–99)
Potassium: 4.4 mmol/L (ref 3.5–5.1)
Sodium: 136 mmol/L (ref 135–145)

## 2019-01-24 MED ORDER — TENOFOVIR ALAFENAMIDE FUMARATE 25 MG PO TABS
25.0000 mg | ORAL_TABLET | Freq: Every day | ORAL | Status: DC
  Administered 2019-01-25 – 2019-01-27 (×3): 25 mg via ORAL
  Filled 2019-01-24 (×4): qty 1

## 2019-01-24 NOTE — Progress Notes (Signed)
1 Day Post-Op   Subjective/Chief Complaint: Minimal pain/soreness.  Has had 3 BMs and some flatus.  No n/v.  No complaints of bloating.  Has been up and around.  Foley pulled this AM.     Objective: Vital signs in last 24 hours: Temp:  [97.3 F (36.3 C)-98 F (36.7 C)] 98 F (36.7 C) (03/04 1006) Pulse Rate:  [71-84] 80 (03/04 1006) Resp:  [13-18] 18 (03/04 1006) BP: (98-112)/(62-80) 109/80 (03/04 1006) SpO2:  [93 %-99 %] 98 % (03/04 1006) Weight:  [74.5 kg] 74.5 kg (03/04 0500) Last BM Date: 01/23/19  Intake/Output from previous day: 03/03 0701 - 03/04 0700 In: 4024.6 [P.O.:580; I.V.:3344.6; IV Piggyback:100] Out: 3120 [Urine:2970; Blood:150] Intake/Output this shift: Total I/O In: 300 [P.O.:300] Out: -   General appearance: alert and no distress Resp: breathing comfortably GI: soft, non distended.  dressings c/d/i.  Extremities: extremities normal, atraumatic, no cyanosis or edema  Lab Results:  Recent Labs    01/23/19 1434 01/24/19 0331  WBC 11.9* 10.8*  HGB 13.8 12.8*  HCT 41.8 37.2*  PLT 188 186   BMET Recent Labs    01/23/19 1434 01/24/19 0331  NA  --  136  K  --  4.4  CL  --  107  CO2  --  24  GLUCOSE  --  156*  BUN  --  13  CREATININE 1.13 1.03  CALCIUM  --  8.9   PT/INR No results for input(s): LABPROT, INR in the last 72 hours. ABG No results for input(s): PHART, HCO3 in the last 72 hours.  Invalid input(s): PCO2, PO2  Studies/Results: No results found.  Anti-infectives: Anti-infectives (From admission, onward)   Start     Dose/Rate Route Frequency Ordered Stop   01/24/19 1000  lamiVUDine (EPIVIR) tablet 300 mg     300 mg Oral Daily 01/23/19 1328     01/24/19 1000  tenofovir (VIREAD) tablet 300 mg     300 mg Oral Daily 01/23/19 1352     01/24/19 0800  atazanavir-cobicistat (Evotaz) 300-150 MG per tablet 1 tablet     1 tablet Oral Daily with breakfast 01/23/19 1328     01/23/19 1330  Tenofovir Alafenamide Fumarate TABS 25 mg   Status:  Discontinued     25 mg Oral Daily 01/23/19 1328 01/23/19 1352   01/23/19 1330  cefoTEtan (CEFOTAN) 2 g in sodium chloride 0.9 % 100 mL IVPB     2 g 200 mL/hr over 30 Minutes Intravenous Every 12 hours 01/23/19 1328 01/23/19 1708   01/23/19 0800  cefoTEtan in Dextrose 5% (CEFOTAN) IVPB 2 g     2 g 100 mL/hr over 30 Minutes Intravenous  Once 01/23/19 0754 01/23/19 0944   01/23/19 0736  cefoTEtan in Dextrose 5% (CEFOTAN) 2-2.08 GM-%(50ML) IVPB    Note to Pharmacy:  Wilburn Mylar   : cabinet override      01/23/19 0736 01/23/19 0944      Assessment/Plan: s/p Procedure(s): LAPAROSCOPIC SIGMOID COLECTOMY ERAS PATHWAY (N/A) foley out.  Advance diet Ambulate IS  HIV meds.  Ok to take home meds if needed. Flomax for h/o urinary retention.     LOS: 1 day    [name] 01/24/2019

## 2019-01-24 NOTE — Progress Notes (Signed)
Pt tolerated his full liquid breakfast well. Requested that we advance his diet prior to the order set by Dr. Donell Beers for 01/25/19.  I paged Dr. Donell Beers and advised the above and received VO to advance diet to soft diet.

## 2019-01-25 LAB — CBC
HCT: 33.9 % — ABNORMAL LOW (ref 39.0–52.0)
Hemoglobin: 11.3 g/dL — ABNORMAL LOW (ref 13.0–17.0)
MCH: 30.9 pg (ref 26.0–34.0)
MCHC: 33.3 g/dL (ref 30.0–36.0)
MCV: 92.6 fL (ref 80.0–100.0)
Platelets: 176 10*3/uL (ref 150–400)
RBC: 3.66 MIL/uL — ABNORMAL LOW (ref 4.22–5.81)
RDW: 11.6 % (ref 11.5–15.5)
WBC: 9.5 10*3/uL (ref 4.0–10.5)
nRBC: 0 % (ref 0.0–0.2)

## 2019-01-25 LAB — BASIC METABOLIC PANEL
Anion gap: 5 (ref 5–15)
BUN: 13 mg/dL (ref 8–23)
CALCIUM: 8.6 mg/dL — AB (ref 8.9–10.3)
CO2: 26 mmol/L (ref 22–32)
Chloride: 105 mmol/L (ref 98–111)
Creatinine, Ser: 1 mg/dL (ref 0.61–1.24)
GFR calc Af Amer: >60 mL/min (ref >60)
GFR calc non Af Amer: >60 mL/min (ref >60)
Glucose, Bld: 135 mg/dL — ABNORMAL HIGH (ref 70–99)
Potassium: 4.2 mmol/L (ref 3.5–5.1)
Sodium: 136 mmol/L (ref 135–145)

## 2019-01-25 MED ORDER — LOPERAMIDE HCL 2 MG PO CAPS
2.0000 mg | ORAL_CAPSULE | Freq: Once | ORAL | Status: AC
  Administered 2019-01-25: 2 mg via ORAL
  Filled 2019-01-25: qty 1

## 2019-01-25 NOTE — Progress Notes (Signed)
2 Days Post-Op   Subjective/Chief Complaint: A bit more sore last night.  Also, BMs that were had were minimal.      Objective: Vital signs in last 24 hours: Temp:  [97.8 F (36.6 C)-98.3 F (36.8 C)] 98.3 F (36.8 C) (03/05 0516) Pulse Rate:  [64-80] 64 (03/05 0516) Resp:  [18-20] 18 (03/05 0516) BP: (109-119)/(70-87) 116/87 (03/05 0516) SpO2:  [95 %-98 %] 95 % (03/05 0516) Weight:  [74.4 kg] 74.4 kg (03/05 0516) Last BM Date: 01/24/19  Intake/Output from previous day: 03/04 0701 - 03/05 0700 In: 2350 [P.O.:300; I.V.:2050] Out: 600 [Urine:600] Intake/Output this shift: Total I/O In: 1151.7 [I.V.:1151.7] Out: 600 [Urine:600]  General appearance: alert and no distress Resp: breathing comfortably GI: soft, non distended.  dressings c/d/i.  Extremities: extremities normal, atraumatic, no cyanosis or edema  Lab Results:  Recent Labs    01/24/19 0331 01/25/19 0124  WBC 10.8* 9.5  HGB 12.8* 11.3*  HCT 37.2* 33.9*  PLT 186 176   BMET Recent Labs    01/24/19 0331 01/25/19 0124  NA 136 136  K 4.4 4.2  CL 107 105  CO2 24 26  GLUCOSE 156* 135*  BUN 13 13  CREATININE 1.03 1.00  CALCIUM 8.9 8.6*   PT/INR No results for input(s): LABPROT, INR in the last 72 hours. ABG No results for input(s): PHART, HCO3 in the last 72 hours.  Invalid input(s): PCO2, PO2  Studies/Results: No results found.  Anti-infectives: Anti-infectives (From admission, onward)   Start     Dose/Rate Route Frequency Ordered Stop   01/25/19 1000  Tenofovir Alafenamide Fumarate TABS 25 mg     25 mg Oral Daily 01/24/19 1551     01/24/19 1000  lamiVUDine (EPIVIR) tablet 300 mg     300 mg Oral Daily 01/23/19 1328     01/24/19 1000  tenofovir (VIREAD) tablet 300 mg  Status:  Discontinued     300 mg Oral Daily 01/23/19 1352 01/24/19 1551   01/24/19 0800  atazanavir-cobicistat (Evotaz) 300-150 MG per tablet 1 tablet     1 tablet Oral Daily with breakfast 01/23/19 1328     01/23/19 1330   Tenofovir Alafenamide Fumarate TABS 25 mg  Status:  Discontinued     25 mg Oral Daily 01/23/19 1328 01/23/19 1352   01/23/19 1330  cefoTEtan (CEFOTAN) 2 g in sodium chloride 0.9 % 100 mL IVPB     2 g 200 mL/hr over 30 Minutes Intravenous Every 12 hours 01/23/19 1328 01/23/19 1708   01/23/19 0800  cefoTEtan in Dextrose 5% (CEFOTAN) IVPB 2 g     2 g 100 mL/hr over 30 Minutes Intravenous  Once 01/23/19 0754 01/23/19 0944   01/23/19 0736  cefoTEtan in Dextrose 5% (CEFOTAN) 2-2.08 GM-%(50ML) IVPB    Note to Pharmacy:  Wilburn Mylar   : cabinet override      01/23/19 0736 01/23/19 0944      Assessment/Plan: s/p Procedure(s): LAPAROSCOPIC SIGMOID COLECTOMY ERAS PATHWAY (N/A) Diet as tolerated.   Ambulate IS  HIV meds.  Ok to take home meds if needed. Flomax for h/o urinary retention.    Given minimal BM, will keep until tomorrow to make sure as his PO intake increases that he does not become distended.     LOS: 2 days    [name] 01/25/2019

## 2019-01-25 NOTE — Progress Notes (Signed)
Pt states he has had at least 10 watery stools today. He says stools are mostly clear with some "brown debris".Abdomen is soft, nondistended; no increased pain, no nausea or vomiting,tolerating diet. Patient requesting Lomotil which he sometimes takes at home. MD paged.

## 2019-01-26 LAB — CBC
HCT: 33.9 % — ABNORMAL LOW (ref 39.0–52.0)
Hemoglobin: 11.6 g/dL — ABNORMAL LOW (ref 13.0–17.0)
MCH: 31.8 pg (ref 26.0–34.0)
MCHC: 34.2 g/dL (ref 30.0–36.0)
MCV: 92.9 fL (ref 80.0–100.0)
Platelets: 171 10*3/uL (ref 150–400)
RBC: 3.65 MIL/uL — ABNORMAL LOW (ref 4.22–5.81)
RDW: 11.8 % (ref 11.5–15.5)
WBC: 7.1 10*3/uL (ref 4.0–10.5)
nRBC: 0 % (ref 0.0–0.2)

## 2019-01-26 LAB — BASIC METABOLIC PANEL
Anion gap: 4 — ABNORMAL LOW (ref 5–15)
BUN: 11 mg/dL (ref 8–23)
CO2: 27 mmol/L (ref 22–32)
Calcium: 8.9 mg/dL (ref 8.9–10.3)
Chloride: 108 mmol/L (ref 98–111)
Creatinine, Ser: 1.03 mg/dL (ref 0.61–1.24)
GFR calc Af Amer: >60 mL/min (ref >60)
GFR calc non Af Amer: >60 mL/min (ref >60)
Glucose, Bld: 123 mg/dL — ABNORMAL HIGH (ref 70–99)
Potassium: 3.9 mmol/L (ref 3.5–5.1)
Sodium: 139 mmol/L (ref 135–145)

## 2019-01-26 MED ORDER — LORAZEPAM 1 MG PO TABS: 1.0000 mg | ORAL_TABLET | Freq: Every day | ORAL | Status: DC | PRN

## 2019-01-26 MED ORDER — DIPHENOXYLATE-ATROPINE 2.5-0.025 MG PO TABS
0.5000 | ORAL_TABLET | Freq: Four times a day (QID) | ORAL | Status: DC | PRN
  Administered 2019-01-26: 1 via ORAL
  Filled 2019-01-26: qty 1

## 2019-01-26 MED ORDER — TRAMADOL HCL 50 MG PO TABS: 50.0000 mg | ORAL_TABLET | Freq: Four times a day (QID) | ORAL | 0 refills | Status: DC | PRN

## 2019-01-26 MED ORDER — DIPHENOXYLATE-ATROPINE 2.5-0.025 MG PO TABS: 1.0000 | ORAL_TABLET | Freq: Four times a day (QID) | ORAL | Status: DC | PRN

## 2019-01-26 MED ORDER — OXYCODONE HCL 5 MG PO TABS: 5.0000 mg | ORAL_TABLET | ORAL | 0 refills | Status: DC | PRN

## 2019-01-26 NOTE — Progress Notes (Signed)
3 Days Post-Op   Subjective/Chief Complaint: Pt had 10 BMs yesterday.  Small volume, but watery. This occurs sometimes due to his HIV meds.  He is not having pain, belching, or nausea.  He is tolerating diet.     Objective: Vital signs in last 24 hours: Temp:  [97.8 F (36.6 C)-98.1 F (36.7 C)] 97.8 F (36.6 C) (03/06 0539) Pulse Rate:  [61-81] 61 (03/06 0539) Resp:  [18-21] 18 (03/06 0539) BP: (113-129)/(81-88) 129/88 (03/06 0539) SpO2:  [95 %-97 %] 95 % (03/06 0539) Last BM Date: 01/25/19  Intake/Output from previous day: 03/05 0701 - 03/06 0700 In: 2914.6 [P.O.:1200; I.V.:1714.6] Out: 1600 [Urine:1600] Intake/Output this shift: No intake/output data recorded.  General appearance: alert and no distress Resp: breathing comfortably GI: soft, non distended.  dressings c/d/i.  Extremities: extremities normal, atraumatic, no cyanosis or edema  Lab Results:  Recent Labs    01/25/19 0124 01/26/19 0128  WBC 9.5 7.1  HGB 11.3* 11.6*  HCT 33.9* 33.9*  PLT 176 171   BMET Recent Labs    01/25/19 0124 01/26/19 0128  NA 136 139  K 4.2 3.9  CL 105 108  CO2 26 27  GLUCOSE 135* 123*  BUN 13 11  CREATININE 1.00 1.03  CALCIUM 8.6* 8.9   PT/INR No results for input(s): LABPROT, INR in the last 72 hours. ABG No results for input(s): PHART, HCO3 in the last 72 hours.  Invalid input(s): PCO2, PO2  Studies/Results: No results found.  Anti-infectives: Anti-infectives (From admission, onward)   Start     Dose/Rate Route Frequency Ordered Stop   01/25/19 1000  Tenofovir Alafenamide Fumarate TABS 25 mg     25 mg Oral Daily 01/24/19 1551     01/24/19 1000  lamiVUDine (EPIVIR) tablet 300 mg     300 mg Oral Daily 01/23/19 1328     01/24/19 1000  tenofovir (VIREAD) tablet 300 mg  Status:  Discontinued     300 mg Oral Daily 01/23/19 1352 01/24/19 1551   01/24/19 0800  atazanavir-cobicistat (Evotaz) 300-150 MG per tablet 1 tablet     1 tablet Oral Daily with breakfast  01/23/19 1328     01/23/19 1330  Tenofovir Alafenamide Fumarate TABS 25 mg  Status:  Discontinued     25 mg Oral Daily 01/23/19 1328 01/23/19 1352   01/23/19 1330  cefoTEtan (CEFOTAN) 2 g in sodium chloride 0.9 % 100 mL IVPB     2 g 200 mL/hr over 30 Minutes Intravenous Every 12 hours 01/23/19 1328 01/23/19 1708   01/23/19 0800  cefoTEtan in Dextrose 5% (CEFOTAN) IVPB 2 g     2 g 100 mL/hr over 30 Minutes Intravenous  Once 01/23/19 0754 01/23/19 0944   01/23/19 0736  cefoTEtan in Dextrose 5% (CEFOTAN) 2-2.08 GM-%(50ML) IVPB    Note to Pharmacy:  Wilburn Mylar   : cabinet override      01/23/19 0736 01/23/19 0944      Assessment/Plan: s/p Procedure(s): LAPAROSCOPIC SIGMOID COLECTOMY ERAS PATHWAY (N/A) Diet as tolerated.   Ambulate IS  HIV meds.  Ok to take home meds if needed. Flomax for h/o urinary retention.    Restart lomotil.  Saline lock IV fluids at 2 pm.  If electrolytes ok tomorrow and BMs slow down, d/c tomorrow.     LOS: 3 days    [name] 01/26/2019

## 2019-01-26 NOTE — Discharge Summary (Signed)
Physician Discharge Summary  Patient ID: Richard Velazquez MRN: [id number] DOB/AGE: 63/11/1955 63 y.o.  Admit date: 01/23/2019 Discharge date: 01/27/2019  Admission Diagnoses: Patient Active Problem List   Diagnosis Date Noted  . Sigmoid diverticulitis 01/23/2019  . Anxiety 05/08/2015  . Essential (primary) hypertension 09/19/2014  . Arthritis of hand, degenerative 09/19/2014  . Amnesia 03/01/2014  . HIV disease (HCC) 11/04/2013  . Diverticulitis 09/20/2013  . Fatigue 09/20/2013  . Prostatitis 09/20/2013  . Carpal tunnel syndrome 03/01/2013  . Clinical depression 03/01/2013  . Lumbar disc disease with radiculopathy 03/01/2013    Discharge Diagnoses:  Active Problems:   Sigmoid diverticulitis and same as above  Discharged Condition: stable  Hospital Course:  Patient was admitted to the floor following lap sigmoid colectomy.  He did well, passing gas on POD 1.  His diet was advanced.  He was able to void after foley removal.  He had only one episode of nausea.  His HIV meds do sometimes cause diarrhea, and on POD 2-3 he had 10 watery BMs.  His home lomotil was restarted and this resolved the issues.  His electrolytes were stable.  He was discharged to home in stable condition.  Incisions looked good and diarrhea had abated.   Consults: None  Significant Diagnostic Studies: labs:electrolytes prior to d/c.  Looked good HCT stable and looked good, WBCs not elevated  Treatments: surgery: see above.    Discharge Exam: Blood pressure 129/88, pulse 61, temperature 97.8 F (36.6 C), temperature source Oral, resp. rate 18, height 5' 10.5" (1.791 m), weight 74.4 kg, SpO2 95 %. General appearance: alert, cooperative and no distress Resp: breathing comfortably GI: soft, non distended, wounds d/c/i.  no erythema or drainage. Extremities: extremities normal, atraumatic, no cyanosis or edema  Disposition:    Allergies as of 01/26/2019      Reactions   Androgel [testosterone]  Anaphylaxis   Clindamycin/lincomycin Other (See Comments)   Tendonitis    Levaquin [levofloxacin In D5w] Other (See Comments)   Tendonitis    Cymbalta [duloxetine Hcl] Other (See Comments)   fatigue   Trazodone And Nefazodone Other (See Comments)   prioprism   Sildenafil Rash   Anxiety, palpitations, no effect of drug      Medication List    TAKE these medications   buPROPion 150 MG 12 hr tablet Commonly known as:  WELLBUTRIN SR Take 150 mg by mouth 2 (two) times daily.   clobetasol ointment 0.05 % Commonly known as:  TEMOVATE Apply 1 application topically 2 (two) times daily as needed (skin irritation).   cyanocobalamin 2000 MCG tablet Take 2,000 mcg by mouth daily.   diphenoxylate-atropine 2.5-0.025 MG tablet Commonly known as:  LOMOTIL Take 1 tablet by mouth 4 (four) times daily as needed for diarrhea or loose stools.   Evotaz 300-150 MG tablet Generic drug:  atazanavir-cobicistat Take 1 tablet by mouth daily with breakfast.   fish oil-omega-3 fatty acids 1000 MG capsule Take 1 g by mouth daily.   ibuprofen 200 MG tablet Commonly known as:  ADVIL,MOTRIN Take 800 mg by mouth every 6 (six) hours as needed for headache or mild pain.   lamivudine 300 MG tablet Commonly known as:  EPIVIR Take 300 mg by mouth daily.   lisinopril 10 MG tablet Commonly known as:  PRINIVIL,ZESTRIL Take 10 mg by mouth daily.   lisinopril-hydrochlorothiazide 10-12.5 MG tablet Commonly known as:  PRINZIDE,ZESTORETIC Take 0.5 tablets by mouth daily.   LORazepam 1 MG tablet Commonly known as:  ATIVAN Take  1 mg by mouth daily.   OVER THE COUNTER MEDICATION Take 1 capsule by mouth at bedtime. Primary Digestive Supplement   OVER THE COUNTER MEDICATION Take 1 capsule by mouth at bedtime. There-Biotic Supplement   oxyCODONE 5 MG immediate release tablet Commonly known as:  Oxy IR/ROXICODONE Take 1-2 tablets (5-10 mg total) by mouth every 4 (four) hours as needed for moderate pain.    PROBIOTIC-10 ULTIMATE PO Take 1 capsule by mouth daily.   silodosin 8 MG Caps capsule Commonly known as:  RAPAFLO Take 8 mg by mouth at bedtime.   traMADol 50 MG tablet Commonly known as:  ULTRAM Take 1 tablet (50 mg total) by mouth every 6 (six) hours as needed (mild pain).   Vemlidy 25 MG Tabs Generic drug:  Tenofovir Alafenamide Fumarate Take 25 mg by mouth daily.   vitamin C 100 MG tablet Take 500 mg by mouth 3 (three) times daily.   VITAMIN D PO Take 1,000 Units by mouth daily.      Follow-up Information    [name], MD In 2 weeks.   Specialty:  General Surgery Contact information: [address] [place] [address] [phone]           Signed: [name] 01/26/2019, 9:06 AM

## 2019-01-26 NOTE — Care Management Important Message (Signed)
Important Message  Patient Details  Name: Richard Velazquez MRN: [id number] Date of Birth: 07-31-1956   Medicare Important Message Given:  Yes    Renie Ora 01/26/2019, 3:37 PM

## 2019-01-27 LAB — CBC
HCT: 36 % — ABNORMAL LOW (ref 39.0–52.0)
HEMOGLOBIN: 12.1 g/dL — AB (ref 13.0–17.0)
MCH: 31.1 pg (ref 26.0–34.0)
MCHC: 33.6 g/dL (ref 30.0–36.0)
MCV: 92.5 fL (ref 80.0–100.0)
Platelets: 188 10*3/uL (ref 150–400)
RBC: 3.89 MIL/uL — ABNORMAL LOW (ref 4.22–5.81)
RDW: 11.7 % (ref 11.5–15.5)
WBC: 6.9 10*3/uL (ref 4.0–10.5)
nRBC: 0 % (ref 0.0–0.2)

## 2019-01-27 LAB — BASIC METABOLIC PANEL
Anion gap: 7 (ref 5–15)
BUN: 15 mg/dL (ref 8–23)
CHLORIDE: 105 mmol/L (ref 98–111)
CO2: 26 mmol/L (ref 22–32)
Calcium: 9 mg/dL (ref 8.9–10.3)
Creatinine, Ser: 1.09 mg/dL (ref 0.61–1.24)
GFR calc Af Amer: >60 mL/min (ref >60)
GFR calc non Af Amer: >60 mL/min (ref >60)
GLUCOSE: 97 mg/dL (ref 70–99)
Potassium: 4 mmol/L (ref 3.5–5.1)
Sodium: 138 mmol/L (ref 135–145)

## 2019-01-27 NOTE — Discharge Summary (Signed)
Physician Discharge Summary  Patient ID: Richard Velazquez MRN: [id number] DOB/AGE: 25-Aug-1956 63 y.o.  PCP: Richard Flood, MD  Admit date: 01/23/2019 Discharge date: 01/27/2019  Admission Diagnoses:  Sigmoid diverticulitis  Discharge Diagnoses:  Same post colectomy  Active Problems:   Sigmoid diverticulitis   Surgery:  Laparoscopic sigmoid colectomy  Discharged Condition: improved  Hospital Course:   Lap sigmoid colectomy by Dr. Donell Velazquez;  Did well except for diarrhea requiring LoMotil.  Stools firmed up and ready for discharge on Saturday.    Consults: none  Significant Diagnostic Studies: path pending    Discharge Exam: Blood pressure (!) 131/93, pulse 65, temperature (!) 97.4 F (36.3 C), temperature source Oral, resp. rate 20, height 5' 10.5" (1.791 m), weight 74.4 kg, SpO2 96 %. Incisions are all bland  Disposition: Discharge disposition: 01-Home or Self Care       Discharge Instructions    Call MD for:  redness, tenderness, or signs of infection (pain, swelling, redness, odor or green/yellow discharge around incision site)   Complete by:  As directed    Diet - low sodium heart healthy   Complete by:  As directed    Increase activity slowly   Complete by:  As directed      Allergies as of 01/27/2019      Reactions   Androgel [testosterone] Anaphylaxis   Clindamycin/lincomycin Other (See Comments)   Tendonitis    Levaquin [levofloxacin In D5w] Other (See Comments)   Tendonitis    Cymbalta [duloxetine Hcl] Other (See Comments)   fatigue   Trazodone And Nefazodone Other (See Comments)   prioprism   Sildenafil Rash   Anxiety, palpitations, no effect of drug      Medication List    TAKE these medications   buPROPion 150 MG 12 hr tablet Commonly known as:  WELLBUTRIN SR Take 150 mg by mouth 2 (two) times daily.   clobetasol ointment 0.05 % Commonly known as:  TEMOVATE Apply 1 application topically 2 (two) times daily as needed (skin  irritation).   cyanocobalamin 2000 MCG tablet Take 2,000 mcg by mouth daily.   diphenoxylate-atropine 2.5-0.025 MG tablet Commonly known as:  LOMOTIL Take 1 tablet by mouth 4 (four) times daily as needed for diarrhea or loose stools.   Evotaz 300-150 MG tablet Generic drug:  atazanavir-cobicistat Take 1 tablet by mouth daily with breakfast.   fish oil-omega-3 fatty acids 1000 MG capsule Take 1 g by mouth daily.   ibuprofen 200 MG tablet Commonly known as:  ADVIL,MOTRIN Take 800 mg by mouth every 6 (six) hours as needed for headache or mild pain.   lamivudine 300 MG tablet Commonly known as:  EPIVIR Take 300 mg by mouth daily.   lisinopril 10 MG tablet Commonly known as:  PRINIVIL,ZESTRIL Take 10 mg by mouth daily.   lisinopril-hydrochlorothiazide 10-12.5 MG tablet Commonly known as:  PRINZIDE,ZESTORETIC Take 0.5 tablets by mouth daily.   LORazepam 1 MG tablet Commonly known as:  ATIVAN Take 1 mg by mouth daily.   OVER THE COUNTER MEDICATION Take 1 capsule by mouth at bedtime. Primary Digestive Supplement   OVER THE COUNTER MEDICATION Take 1 capsule by mouth at bedtime. There-Biotic Supplement   oxyCODONE 5 MG immediate release tablet Commonly known as:  Oxy IR/ROXICODONE Take 1-2 tablets (5-10 mg total) by mouth every 4 (four) hours as needed for moderate pain.   PROBIOTIC-10 ULTIMATE PO Take 1 capsule by mouth daily.   silodosin 8 MG Caps capsule Commonly known as:  RAPAFLO Take 8 mg by mouth at bedtime.   traMADol 50 MG tablet Commonly known as:  ULTRAM Take 1 tablet (50 mg total) by mouth every 6 (six) hours as needed (mild pain).   Vemlidy 25 MG Tabs Generic drug:  Tenofovir Alafenamide Fumarate Take 25 mg by mouth daily.   vitamin C 100 MG tablet Take 500 mg by mouth 3 (three) times daily.   VITAMIN D PO Take 1,000 Units by mouth daily.      Follow-up Information    Richard Lint, MD Follow up on 02/23/2019.   Specialty:  General Surgery Why:   9:30 am, Please arrive at 9:15. Contact information: [address] [place] [address] [phone]           Signed: [name] 01/27/2019, 8:13 AM

## 2019-03-07 DIAGNOSIS — M25511 Pain in right shoulder: Secondary | ICD-10-CM | POA: Diagnosis not present

## 2019-03-08 DIAGNOSIS — M4722 Other spondylosis with radiculopathy, cervical region: Secondary | ICD-10-CM | POA: Diagnosis not present

## 2019-03-08 DIAGNOSIS — S134XXD Sprain of ligaments of cervical spine, subsequent encounter: Secondary | ICD-10-CM | POA: Diagnosis not present

## 2019-03-08 DIAGNOSIS — M5412 Radiculopathy, cervical region: Secondary | ICD-10-CM | POA: Diagnosis not present

## 2019-03-13 DIAGNOSIS — M5412 Radiculopathy, cervical region: Secondary | ICD-10-CM | POA: Diagnosis not present

## 2019-03-15 DIAGNOSIS — M5412 Radiculopathy, cervical region: Secondary | ICD-10-CM | POA: Diagnosis not present

## 2019-03-16 DIAGNOSIS — M5412 Radiculopathy, cervical region: Secondary | ICD-10-CM | POA: Diagnosis not present

## 2019-03-20 DIAGNOSIS — M5412 Radiculopathy, cervical region: Secondary | ICD-10-CM | POA: Diagnosis not present

## 2019-03-22 DIAGNOSIS — M5412 Radiculopathy, cervical region: Secondary | ICD-10-CM | POA: Diagnosis not present

## 2019-03-27 DIAGNOSIS — M5412 Radiculopathy, cervical region: Secondary | ICD-10-CM | POA: Diagnosis not present

## 2019-03-29 DIAGNOSIS — M5412 Radiculopathy, cervical region: Secondary | ICD-10-CM | POA: Diagnosis not present

## 2019-04-02 DIAGNOSIS — M5412 Radiculopathy, cervical region: Secondary | ICD-10-CM | POA: Diagnosis not present

## 2019-04-04 DIAGNOSIS — M5412 Radiculopathy, cervical region: Secondary | ICD-10-CM | POA: Diagnosis not present

## 2019-04-05 ENCOUNTER — Telehealth
Admit: 2019-04-05 | Discharge: 2019-04-06 | Payer: MEDICARE | Attending: Infectious Disease | Primary: Infectious Disease

## 2019-04-05 DIAGNOSIS — R69 Illness, unspecified: Secondary | ICD-10-CM | POA: Diagnosis not present

## 2019-04-05 DIAGNOSIS — F3289 Other specified depressive episodes: Secondary | ICD-10-CM

## 2019-04-05 DIAGNOSIS — F329 Major depressive disorder, single episode, unspecified: Secondary | ICD-10-CM

## 2019-04-05 DIAGNOSIS — B20 Human immunodeficiency virus [HIV] disease: Secondary | ICD-10-CM

## 2019-04-05 DIAGNOSIS — F419 Anxiety disorder, unspecified: Principal | ICD-10-CM

## 2019-04-05 MED ORDER — LAMIVUDINE 300 MG TABLET
ORAL_TABLET | Freq: Every day | ORAL | 3 refills | 0 days | Status: CP
Start: 2019-04-05 — End: ?

## 2019-04-05 MED ORDER — LISINOPRIL 10 MG TABLET
ORAL_TABLET | Freq: Every day | ORAL | 6 refills | 0 days
Start: 2019-04-05 — End: 2019-04-05

## 2019-04-05 MED ORDER — LORAZEPAM 1 MG TABLET
ORAL_TABLET | Freq: Every day | ORAL | 5 refills | 0.00000 days | Status: CP
Start: 2019-04-05 — End: ?

## 2019-04-05 MED ORDER — LISINOPRIL 5 MG TABLET
ORAL_TABLET | Freq: Every day | ORAL | 3 refills | 0.00000 days | Status: CP
Start: 2019-04-05 — End: 2019-07-04

## 2019-04-05 MED ORDER — BUPROPION HCL SR 150 MG TABLET,12 HR SUSTAINED-RELEASE
ORAL_TABLET | Freq: Two times a day (BID) | ORAL | 3 refills | 0 days | Status: CP
Start: 2019-04-05 — End: ?

## 2019-04-09 DIAGNOSIS — R69 Illness, unspecified: Secondary | ICD-10-CM | POA: Diagnosis not present

## 2019-04-09 DIAGNOSIS — M542 Cervicalgia: Secondary | ICD-10-CM | POA: Diagnosis not present

## 2019-04-17 DIAGNOSIS — M5412 Radiculopathy, cervical region: Secondary | ICD-10-CM | POA: Diagnosis not present

## 2019-04-20 ENCOUNTER — Telehealth: Payer: Self-pay | Admitting: Family Medicine

## 2019-04-20 NOTE — Telephone Encounter (Signed)
LVM FOR PATIENT TO CALL AND CONFIRM APPNT 06/03

## 2019-04-20 NOTE — Telephone Encounter (Signed)
Note received from [facility] - glucose 148 on 5/18. Please schedule appointment to discuss and can perform other testing at that time. Thanks.

## 2019-04-23 DIAGNOSIS — M5412 Radiculopathy, cervical region: Secondary | ICD-10-CM | POA: Diagnosis not present

## 2019-04-25 ENCOUNTER — Other Ambulatory Visit: Payer: Self-pay

## 2019-04-25 ENCOUNTER — Ambulatory Visit (INDEPENDENT_AMBULATORY_CARE_PROVIDER_SITE_OTHER): Payer: Medicare HMO | Admitting: Family Medicine

## 2019-04-25 VITALS — HR 82 | Temp 97.5°F | Ht 70.0 in | Wt 152.0 lb

## 2019-04-25 DIAGNOSIS — R110 Nausea: Secondary | ICD-10-CM

## 2019-04-25 DIAGNOSIS — R739 Hyperglycemia, unspecified: Secondary | ICD-10-CM

## 2019-04-25 DIAGNOSIS — R634 Abnormal weight loss: Secondary | ICD-10-CM

## 2019-04-25 LAB — GLUCOSE, POCT (MANUAL RESULT ENTRY): POC Glucose: 119 mg/dl — AB (ref 70–99)

## 2019-04-25 LAB — POCT GLYCOSYLATED HEMOGLOBIN (HGB A1C): Hemoglobin A1C: 5.6 % (ref 4.0–5.6)

## 2019-04-25 NOTE — Patient Instructions (Addendum)
Blood sugar slightly elevated, hemoglobin A1c at the higher end of normal, but not quite at prediabetes level.   Cutting back on desserts and sugar in diet can help.  See information below on some dietary recommendations for patients to have prediabetes, but again you do not have that at this time.  I do recommend repeat testing in 3 months.  If nausea symptoms return, would recommend follow-up visit to discuss further work-up or evaluation.  I will check a pancreas test today, but imaging of that area looked ok last year.   If weight decreases further in next 6 weeks - return to discuss further, otherwise follow up in 3 months.   Return to the clinic or go to the nearest emergency room if any of your symptoms worsen or new symptoms occur.   Nausea, Adult Nausea is the feeling that you have an upset stomach or that you are about to vomit. Nausea on its own is not usually a serious concern, but it may be an early sign of a more serious medical problem. As nausea gets worse, it can lead to vomiting. If vomiting develops, or if you are not able to drink enough fluids, you are at risk of becoming dehydrated. Dehydration can make you tired and thirsty, cause you to have a dry mouth, and decrease how often you urinate. Older adults and people with other diseases or a weak disease-fighting system (immune system) are at higher risk for dehydration. The main goals of treating your nausea are:  To relieve your nausea.  To limit repeated nausea episodes.  To prevent vomiting and dehydration. Follow these instructions at home: Watch your symptoms for any changes. Tell your health care provider about them. Follow these instructions as told by your health care provider. Eating and drinking      Take an oral rehydration solution (ORS). This is a drink that is sold at pharmacies and retail stores.  Drink clear fluids slowly and in small amounts as you are able. Clear fluids include water, ice chips,  low-calorie sports drinks, and fruit juice that has water added (diluted fruit juice).  Eat bland, easy-to-digest foods in small amounts as you are able. These foods include bananas, applesauce, rice, lean meats, toast, and crackers.  Avoid drinking fluids that contain a lot of sugar or caffeine, such as energy drinks, sports drinks, and soda.  Avoid alcohol.  Avoid spicy or fatty foods. General instructions  Take over-the-counter and prescription medicines only as told by your health care provider.  Rest at home while you recover.  Drink enough fluid to keep your urine pale yellow.  Breathe slowly and deeply when you feel nauseous.  Avoid smelling things that have strong odors.  Wash your hands often using soap and water. If soap and water are not available, use hand sanitizer.  Make sure that all people in your household wash their hands well and often.  Keep all follow-up visits as told by your health care provider. This is important. Contact a health care provider if:  Your nausea gets worse.  Your nausea does not go away after two days.  You vomit.  You cannot drink fluids without vomiting.  You have any of the following: ? New symptoms. ? A fever. ? A headache. ? Muscle cramps. ? A rash. ? Pain while urinating.  You feel light-headed or dizzy. Get help right away if:  You have pain in your chest, neck, arm, or jaw.  You feel extremely weak or  you faint.  You have vomit that is bright red or looks like coffee grounds.  You have bloody or black stools or stools that look like tar.  You have a severe headache, a stiff neck, or both.  You have severe pain, cramping, or bloating in your abdomen.  You have difficulty breathing or are breathing very quickly.  Your heart is beating very quickly.  Your skin feels cold and clammy.  You feel confused.  You have signs of dehydration, such as: ? Dark urine, very little urine, or no urine. ? Cracked  lips. ? Dry mouth. ? Sunken eyes. ? Sleepiness. ? Weakness. These symptoms may represent a serious problem that is an emergency. Do not wait to see if the symptoms will go away. Get medical help right away. Call your local emergency services (911 in the [place] Do not drive yourself to the hospital. Summary  Nausea is the feeling that you have an upset stomach or that you are about to vomit. Nausea on its own is not usually a serious concern, but it may be an early sign of a more serious medical problem.  If vomiting develops, or if you are not able to drink enough fluids, you are at risk of becoming dehydrated.  Follow recommendations for eating and drinking and take over-the-counter and prescription medicines only as told by your health care provider.  Contact a health care provider right away if your symptoms worsen or you have new symptoms.  Keep all follow-up visits as told by your health care provider. This is important. This information is not intended to replace advice given to you by your health care provider. Make sure you discuss any questions you have with your health care provider. Document Released: 12/16/2004 Document Revised: 04/18/2018 Document Reviewed: 04/18/2018 Elsevier Interactive Patient Education  2019 [employer] Inc[employer]   Prediabetes Eating Plan Prediabetes is a condition that causes blood sugar (glucose) levels to be higher than normal. This increases the risk for developing diabetes. In order to prevent diabetes from developing, your health care provider may recommend a diet and other lifestyle changes to help you:  Control your blood glucose levels.  Improve your cholesterol levels.  Manage your blood pressure. Your health care provider may recommend working with a diet and nutrition specialist (dietitian) to make a meal plan that is best for you. What are tips for following this plan? Lifestyle  Exercise for at least 30 minutes at least 5 days a  week.  Attend a support group or seek ongoing support from a mental health counselor.  Take over-the-counter and prescription medicines only as told by your health care provider. Reading food labels  Read food labels to check the amount of fat, salt (sodium), and sugar in prepackaged foods. Avoid foods that have: ? Saturated fats. ? Trans fats. ? Added sugars.  Avoid foods that have more than 300 milligrams (mg) of sodium per serving. Limit your daily sodium intake to less than 2,300 mg each day. Shopping  Avoid buying pre-made and processed foods. Cooking  Cook with olive oil. Do not use butter, lard, or ghee.  Bake, broil, grill, or boil foods. Avoid frying. Meal planning   Work with your dietitian to develop an eating plan that is right for you. This may include: ? Tracking how many calories you take in. Use a food diary, notebook, or mobile application to track what you eat at each meal. ? Using the glycemic index (GI) to plan your meals. The index tells  you how quickly a food will raise your blood glucose. Choose low-GI foods. These foods take a longer time to raise blood glucose.  Consider following a Mediterranean diet. This diet includes: ? Several servings each day of fresh fruits and vegetables. ? Eating fish at least twice a week. ? Several servings each day of whole grains, beans, nuts, and seeds. ? Using olive oil instead of other fats. ? Moderate alcohol consumption. ? Eating small amounts of red meat and whole-fat dairy.  If you have high blood pressure, you may need to limit your sodium intake or follow a diet such as the DASH eating plan. DASH is an eating plan that aims to lower high blood pressure. What foods are recommended? The items listed below may not be a complete list. Talk with your dietitian about what dietary choices are best for you. Grains Whole grains, such as whole-wheat or whole-grain breads, crackers, cereals, and pasta. Unsweetened oatmeal.  Bulgur. Barley. Quinoa. Brown rice. Corn or whole-wheat flour tortillas or taco shells. Vegetables Lettuce. Spinach. Peas. Beets. Cauliflower. Cabbage. Broccoli. Carrots. Tomatoes. Squash. Eggplant. Herbs. Peppers. Onions. Cucumbers. Brussels sprouts. Fruits Berries. Bananas. Apples. Oranges. Grapes. Papaya. Mango. Pomegranate. Kiwi. Grapefruit. Cherries. Meats and other protein foods Seafood. Poultry without skin. Lean cuts of pork and beef. Tofu. Eggs. Nuts. Beans. Dairy Low-fat or fat-free dairy products, such as yogurt, cottage cheese, and cheese. Beverages Water. Tea. Coffee. Sugar-free or diet soda. Seltzer water. Lowfat or no-fat milk. Milk alternatives, such as soy or almond milk. Fats and oils Olive oil. Canola oil. Sunflower oil. Grapeseed oil. Avocado. Walnuts. Sweets and desserts Sugar-free or low-fat pudding. Sugar-free or low-fat ice cream and other frozen treats. Seasoning and other foods Herbs. Sodium-free spices. Mustard. Relish. Low-fat, low-sugar ketchup. Low-fat, low-sugar barbecue sauce. Low-fat or fat-free mayonnaise. What foods are not recommended? The items listed below may not be a complete list. Talk with your dietitian about what dietary choices are best for you. Grains Refined white flour and flour products, such as bread, pasta, snack foods, and cereals. Vegetables Canned vegetables. Frozen vegetables with butter or cream sauce. Fruits Fruits canned with syrup. Meats and other protein foods Fatty cuts of meat. Poultry with skin. Breaded or fried meat. Processed meats. Dairy Full-fat yogurt, cheese, or milk. Beverages Sweetened drinks, such as sweet iced tea and soda. Fats and oils Butter. Lard. Ghee. Sweets and desserts Baked goods, such as cake, cupcakes, pastries, cookies, and cheesecake. Seasoning and other foods Spice mixes with added salt. Ketchup. Barbecue sauce. Mayonnaise. Summary  To prevent diabetes from developing, you may need to make  diet and other lifestyle changes to help control blood sugar, improve cholesterol levels, and manage your blood pressure.  Set weight loss goals with the help of your health care team. It is recommended that most people with prediabetes lose 7 percent of their current body weight.  Consider following a Mediterranean diet that includes plenty of fresh fruits and vegetables, whole grains, beans, nuts, seeds, fish, lean meat, low-fat dairy, and healthy oils. This information is not intended to replace advice given to you by your health care provider. Make sure you discuss any questions you have with your health care provider. Document Released: 03/25/2015 Document Revised: 01/12/2017 Document Reviewed: 01/12/2017 Elsevier Interactive Patient Education  [employer]2019 [employer]   If you have lab work done today you will be contacted with your lab results within the next 2 weeks.  If you have not heard from us then please contact us. The  fastest way to get your results is to register for My Chart.   IF you received an x-ray today, you will receive an invoice from [facility]. Please contact [facility] at [phone] with questions or concerns regarding your invoice.   IF you received labwork today, you will receive an invoice from [place]. Please contact [facility] at [phone] with questions or concerns regarding your invoice.   Our billing staff will not be able to assist you with questions regarding bills from these companies.  You will be contacted with the lab results as soon as they are available. The fastest way to get your results is to activate your My Chart account. Instructions are located on the last page of this paperwork. If you have not heard from [place] regarding the results in 2 weeks, please contact this office.

## 2019-04-25 NOTE — Progress Notes (Signed)
Subjective:    Patient ID: Richard Velazquez, male    DOB: Aug 13, 1956, 63 y.o.   MRN: [id number]  HPI Richard Velazquez is a 63 y.o. male Presents today for: Chief Complaint  Patient presents with  . Hyperglycemia    Patient was seen at [facility] Order a blood pannel and it showed blood sugar148 on 04/09/19   Hyperglycemia: Since pandemic, has been eating dessert after dinner, some dietary indiscretion/increased sugar in diet. Some soda on occasion. Down 10 pounds past 6 months. Unknown cause.  Elevated blood sugar noted in labs with infectious disease.  Glucose 148 on that test, but not fasting. Prior range 102-106 through [facility]. Not fasting.  On [facility] - glucose 97 on 01/27/19.  No change in thirst, vision, urinary frequency.  No night sweats.  No fevers.  No med changes.  Nausea  With some fatigue yesterday, doing better today. No fever, change in taste or smell, new cough or dyspnea.  Has had similar episodes for years where he will have episodic nausea for a day or so then resolves on its own.  Can happen every 6 or 8 weeks or so. CT abdomen in 04/2018: "Pancreas: Unremarkable. No pancreatic ductal dilatation or surrounding inflammatory changes"  Wt Readings from Last 3 Encounters:  04/25/19 152 lb (68.9 kg)  01/25/19 164 lb 1.6 oz (74.4 kg)  01/15/19 159 lb 3.2 oz (72.2 kg)  weight 04/2018 - 161  Has been under treatment for back and neck issues, disc issue with planned injection by Dr. Regino Schultze.    Patient Active Problem List   Diagnosis Date Noted  . Sigmoid diverticulitis 01/23/2019  . Anxiety 05/08/2015  . Essential (primary) hypertension 09/19/2014  . Arthritis of hand, degenerative 09/19/2014  . Amnesia 03/01/2014  . HIV disease (HCC) 11/04/2013  . Diverticulitis 09/20/2013  . Fatigue 09/20/2013  . Prostatitis 09/20/2013  . Carpal tunnel syndrome 03/01/2013  . Clinical depression 03/01/2013  . Lumbar disc disease with radiculopathy 03/01/2013   Past Medical  History:  Diagnosis Date  . Arthritis    mild- hands     DDD  . Deaf, left   . Depression   . Diverticulitis   . GERD (gastroesophageal reflux disease)   . HIV infection (HCC)   . Hypertension    Past Surgical History:  Procedure Laterality Date  . CARPAL TUNNEL RELEASE     Right  . COLONOSCOPY    . LAPAROSCOPIC SIGMOID COLECTOMY  01/23/2019   [id number]  . LAPAROSCOPIC SIGMOID COLECTOMY N/A 01/23/2019   Procedure: LAPAROSCOPIC SIGMOID COLECTOMY ERAS PATHWAY;  Surgeon: [name], MD;  Location: [facility] OR;  Service: General;  Laterality: N/A;  . WISDOM TOOTH EXTRACTION     Allergies  Allergen Reactions  . Androgel [Testosterone] Anaphylaxis  . Clindamycin/Lincomycin Other (See Comments)    Tendonitis   . Levaquin [Levofloxacin In D5w] Other (See Comments)    Tendonitis    . Cymbalta [Duloxetine Hcl] Other (See Comments)    fatigue  . Trazodone And Nefazodone Other (See Comments)    prioprism   . Sildenafil Rash    Anxiety, palpitations, no effect of drug   Prior to Admission medications   Medication Sig Start Date End Date Taking? Authorizing Provider  Ascorbic Acid (VITAMIN C) 100 MG tablet Take 500 mg by mouth 3 (three) times daily.    Yes [provider]  atazanavir-cobicistat (EVOTAZ) 300-150 MG tablet Take 1 tablet by mouth daily with breakfast.  09/22/17  Yes [provider]  buPROPion (WELLBUTRIN SR) 150 MG 12 hr tablet Take 150 mg by mouth 2 (two) times daily.   Yes [provider]  cyanocobalamin 2000 MCG tablet Take 2,000 mcg by mouth daily.   Yes [provider]  diphenoxylate-atropine (LOMOTIL) 2.5-0.025 MG tablet Take 1 tablet by mouth 4 (four) times daily as needed for diarrhea or loose stools.    Yes [provider]  fish oil-omega-3 fatty acids 1000 MG capsule Take 1 g by mouth daily.    Yes [provider]  ibuprofen (ADVIL,MOTRIN) 200 MG tablet Take 800 mg by mouth every 6 (six) hours as needed for  headache or mild pain.   Yes [provider]  lamivudine (EPIVIR) 300 MG tablet Take 300 mg by mouth daily.   Yes [provider]  LISINOPRIL PO Take 5 mg by mouth.   Yes [provider]  LORazepam (ATIVAN) 1 MG tablet Take 1 mg by mouth daily.    Yes [provider]  OVER THE COUNTER MEDICATION Take 1 capsule by mouth at bedtime. Primary Digestive Supplement   Yes [provider]  OVER THE COUNTER MEDICATION Take 1 capsule by mouth at bedtime. There-Biotic Supplement   Yes [provider]  Probiotic Product (PROBIOTIC-10 ULTIMATE PO) Take 1 capsule by mouth daily.   Yes [provider]  silodosin (RAPAFLO) 8 MG CAPS capsule Take 8 mg by mouth at bedtime.    Yes [provider]  Tenofovir Alafenamide Fumarate (VEMLIDY) 25 MG TABS Take 25 mg by mouth daily.    Yes [provider]  VITAMIN D PO Take 1,000 Units by mouth daily.    Yes [provider]   Social History   Socioeconomic History  . Marital status: Single    Spouse name: Not on file  . Number of children: Not on file  . Years of education: Not on file  . Highest education level: Not on file  Occupational History  . Not on file  Social Needs  . Financial resource strain: Not on file  . Food insecurity:    Worry: Not on file    Inability: Not on file  . Transportation needs:    Medical: Not on file    Non-medical: Not on file  Tobacco Use  . Smoking status: Never Smoker  . Smokeless tobacco: Never Used  Substance and Sexual Activity  . Alcohol use: Yes    Comment: occasional  . Drug use: No  . Sexual activity: Yes  Lifestyle  . Physical activity:    Days per week: Not on file    Minutes per session: Not on file  . Stress: Not on file  Relationships  . Social connections:    Talks on phone: Not on file    Gets together: Not on file    Attends religious service: Not on file    Active member of club or organization: Not on file     Attends meetings of clubs or organizations: Not on file    Relationship status: Not on file  . Intimate partner violence:    Fear of current or ex partner: Not on file    Emotionally abused: Not on file    Physically abused: Not on file    Forced sexual activity: Not on file  Other Topics Concern  . Not on file  Social History Narrative  . Not on file    Review of Systems Per HPi.     Objective:  Physical Exam Vitals signs reviewed.  Constitutional:      General: He is not in acute distress.    Appearance: He is well-developed. He is not ill-appearing, toxic-appearing or diaphoretic.  HENT:     Head: Normocephalic and atraumatic.  Eyes:     Pupils: Pupils are equal, round, and reactive to light.  Neck:     Vascular: No carotid bruit or JVD.  Cardiovascular:     Rate and Rhythm: Normal rate and regular rhythm.     Heart sounds: Normal heart sounds. No murmur.  Pulmonary:     Effort: Pulmonary effort is normal.     Breath sounds: Normal breath sounds. No rales.  Abdominal:     General: There is no distension.     Tenderness: There is no abdominal tenderness.  Skin:    General: Skin is warm and dry.  Neurological:     Mental Status: He is alert and oriented to person, place, and time.    Vitals:   04/25/19 1010  Pulse: 82  Temp: (!) 97.5 F (36.4 C)  TempSrc: Oral  SpO2: 97%  Weight: 152 lb (68.9 kg)  Height:  (1.778 m)     Results for orders placed or performed in visit on 04/25/19  POCT glucose (manual entry)  Result Value Ref Range   POC Glucose 119 (A) 70 - 99 mg/dl  POCT glycosylated hemoglobin (Hb A1C)  Result Value Ref Range   Hemoglobin A1C 5.6 4.0 - 5.6 %   HbA1c POC (<> result, manual entry)     HbA1c, POC (prediabetic range)     HbA1c, POC (controlled diabetic range)         Assessment & Plan:   Richard Velazquez is a 63 y.o. male Elevated blood sugar - Plan: POCT glucose (manual entry), POCT glycosylated hemoglobin (Hb  A1C)  Hyperglycemia  Nausea without vomiting - Plan: Lipase  Loss of weight - Plan: Lipase   No orders of the defined types were placed in this encounter.  Patient Instructions     Blood sugar slightly elevated, hemoglobin A1c at the higher end of normal, but not quite at prediabetes level.   Cutting back on desserts and sugar in diet can help.  See information below on some dietary recommendations for patients to have prediabetes, but again you do not have that at this time.  I do recommend repeat testing in 3 months.  If nausea symptoms return, would recommend follow-up visit to discuss further work-up or evaluation.  I will check a pancreas test today, but imaging of that area looked ok last year.   If weight decreases further in next 6 weeks - return to discuss further, otherwise follow up in 3 months.   Return to the clinic or go to the nearest emergency room if any of your symptoms worsen or new symptoms occur.   Nausea, Adult Nausea is the feeling that you have an upset stomach or that you are about to vomit. Nausea on its own is not usually a serious concern, but it may be an early sign of a more serious medical problem. As nausea gets worse, it can lead to vomiting. If vomiting develops, or if you are not able to drink enough fluids, you are at risk of becoming dehydrated. Dehydration can make you tired and thirsty, cause you to have a dry mouth, and decrease how often you urinate. Older adults and people with other diseases or a weak disease-fighting system (immune system) are  at higher risk for dehydration. The main goals of treating your nausea are:  To relieve your nausea.  To limit repeated nausea episodes.  To prevent vomiting and dehydration. Follow these instructions at home: Watch your symptoms for any changes. Tell your health care provider about them. Follow these instructions as told by your health care provider. Eating and drinking      Take an oral  rehydration solution (ORS). This is a drink that is sold at pharmacies and retail stores.  Drink clear fluids slowly and in small amounts as you are able. Clear fluids include water, ice chips, low-calorie sports drinks, and fruit juice that has water added (diluted fruit juice).  Eat bland, easy-to-digest foods in small amounts as you are able. These foods include bananas, applesauce, rice, lean meats, toast, and crackers.  Avoid drinking fluids that contain a lot of sugar or caffeine, such as energy drinks, sports drinks, and soda.  Avoid alcohol.  Avoid spicy or fatty foods. General instructions  Take over-the-counter and prescription medicines only as told by your health care provider.  Rest at home while you recover.  Drink enough fluid to keep your urine pale yellow.  Breathe slowly and deeply when you feel nauseous.  Avoid smelling things that have strong odors.  Wash your hands often using soap and water. If soap and water are not available, use hand sanitizer.  Make sure that all people in your household wash their hands well and often.  Keep all follow-up visits as told by your health care provider. This is important. Contact a health care provider if:  Your nausea gets worse.  Your nausea does not go away after two days.  You vomit.  You cannot drink fluids without vomiting.  You have any of the following: ? New symptoms. ? A fever. ? A headache. ? Muscle cramps. ? A rash. ? Pain while urinating.  You feel light-headed or dizzy. Get help right away if:  You have pain in your chest, neck, arm, or jaw.  You feel extremely weak or you faint.  You have vomit that is bright red or looks like coffee grounds.  You have bloody or black stools or stools that look like tar.  You have a severe headache, a stiff neck, or both.  You have severe pain, cramping, or bloating in your abdomen.  You have difficulty breathing or are breathing very quickly.  Your  heart is beating very quickly.  Your skin feels cold and clammy.  You feel confused.  You have signs of dehydration, such as: ? Dark urine, very little urine, or no urine. ? Cracked lips. ? Dry mouth. ? Sunken eyes. ? Sleepiness. ? Weakness. These symptoms may represent a serious problem that is an emergency. Do not wait to see if the symptoms will go away. Get medical help right away. Call your local emergency services (911 in the [place] Do not drive yourself to the hospital. Summary  Nausea is the feeling that you have an upset stomach or that you are about to vomit. Nausea on its own is not usually a serious concern, but it may be an early sign of a more serious medical problem.  If vomiting develops, or if you are not able to drink enough fluids, you are at risk of becoming dehydrated.  Follow recommendations for eating and drinking and take over-the-counter and prescription medicines only as told by your health care provider.  Contact a health care provider right away if your symptoms  worsen or you have new symptoms.  Keep all follow-up visits as told by your health care provider. This is important. This information is not intended to replace advice given to you by your health care provider. Make sure you discuss any questions you have with your health care provider. Document Released: 12/16/2004 Document Revised: 04/18/2018 Document Reviewed: 04/18/2018 Elsevier Interactive Patient Education  2019 Else[employer] Inc.   Prediabetes Eating Plan Prediabetes is a condition that causes blood sugar (glucose) levels to be higher than normal. This increases the risk for developing diabetes. In order to prevent diabetes from developing, your health care provider may recommend a diet and other lifestyle changes to help you:  Control your blood glucose levels.  Improve your cholesterol levels.  Manage your blood pressure. Your health care provider may recommend working with a diet and  nutrition specialist (dietitian) to make a meal plan that is best for you. What are tips for following this plan? Lifestyle  Exercise for at least 30 minutes at least 5 days a week.  Attend a support group or seek ongoing support from a mental health counselor.  Take over-the-counter and prescription medicines only as told by your health care provider. Reading food labels  Read food labels to check the amount of fat, salt (sodium), and sugar in prepackaged foods. Avoid foods that have: ? Saturated fats. ? Trans fats. ? Added sugars.  Avoid foods that have more than 300 milligrams (mg) of sodium per serving. Limit your daily sodium intake to less than 2,300 mg each day. Shopping  Avoid buying pre-made and processed foods. Cooking  Cook with olive oil. Do not use butter, lard, or ghee.  Bake, broil, grill, or boil foods. Avoid frying. Meal planning   Work with your dietitian to develop an eating plan that is right for you. This may include: ? Tracking how many calories you take in. Use a food diary, notebook, or mobile application to track what you eat at each meal. ? Using the glycemic index (GI) to plan your meals. The index tells you how quickly a food will raise your blood glucose. Choose low-GI foods. These foods take a longer time to raise blood glucose.  Consider following a Mediterranean diet. This diet includes: ? Several servings each day of fresh fruits and vegetables. ? Eating fish at least twice a week. ? Several servings each day of whole grains, beans, nuts, and seeds. ? Using olive oil instead of other fats. ? Moderate alcohol consumption. ? Eating small amounts of red meat and whole-fat dairy.  If you have high blood pressure, you may need to limit your sodium intake or follow a diet such as the DASH eating plan. DASH is an eating plan that aims to lower high blood pressure. What foods are recommended? The items listed below may not be a complete list. Talk  with your dietitian about what dietary choices are best for you. Grains Whole grains, such as whole-wheat or whole-grain breads, crackers, cereals, and pasta. Unsweetened oatmeal. Bulgur. Barley. Quinoa. Brown rice. Corn or whole-wheat flour tortillas or taco shells. Vegetables Lettuce. Spinach. Peas. Beets. Cauliflower. Cabbage. Broccoli. Carrots. Tomatoes. Squash. Eggplant. Herbs. Peppers. Onions. Cucumbers. Brussels sprouts. Fruits Berries. Bananas. Apples. Oranges. Grapes. Papaya. Mango. Pomegranate. Kiwi. Grapefruit. Cherries. Meats and other protein foods Seafood. Poultry without skin. Lean cuts of pork and beef. Tofu. Eggs. Nuts. Beans. Dairy Low-fat or fat-free dairy products, such as yogurt, cottage cheese, and cheese. Beverages Water. Tea. Coffee. Sugar-free or diet soda. Seltzer  water. Lowfat or no-fat milk. Milk alternatives, such as soy or almond milk. Fats and oils Olive oil. Canola oil. Sunflower oil. Grapeseed oil. Avocado. Walnuts. Sweets and desserts Sugar-free or low-fat pudding. Sugar-free or low-fat ice cream and other frozen treats. Seasoning and other foods Herbs. Sodium-free spices. Mustard. Relish. Low-fat, low-sugar ketchup. Low-fat, low-sugar barbecue sauce. Low-fat or fat-free mayonnaise. What foods are not recommended? The items listed below may not be a complete list. Talk with your dietitian about what dietary choices are best for you. Grains Refined white flour and flour products, such as bread, pasta, snack foods, and cereals. Vegetables Canned vegetables. Frozen vegetables with butter or cream sauce. Fruits Fruits canned with syrup. Meats and other protein foods Fatty cuts of meat. Poultry with skin. Breaded or fried meat. Processed meats. Dairy Full-fat yogurt, cheese, or milk. Beverages Sweetened drinks, such as sweet iced tea and soda. Fats and oils Butter. Lard. Ghee. Sweets and desserts Baked goods, such as cake, cupcakes, pastries, cookies,  and cheesecake. Seasoning and other foods Spice mixes with added salt. Ketchup. Barbecue sauce. Mayonnaise. Summary  To prevent diabetes from developing, you may need to make diet and other lifestyle changes to help control blood sugar, improve cholesterol levels, and manage your blood pressure.  Set weight loss goals with the help of your health care team. It is recommended that most people with prediabetes lose 7 percent of their current body weight.  Consider following a Mediterranean diet that includes plenty of fresh fruits and vegetables, whole grains, beans, nuts, seeds, fish, lean meat, low-fat dairy, and healthy oils. This information is not intended to replace advice given to you by your health care provider. Make sure you discuss any questions you have with your health care provider. Document Released: 03/25/2015 Document Revised: 01/12/2017 Document Reviewed: 01/12/2017 Elsevier Interactive Patient Education  [employer].   If you have lab work done today you will be contacted with your lab results within the next 2 weeks.  If you have not heard from [place] then please contact us. The fastest way to get your results is to register for My Chart.   IF you received an x-ray today, you will receive an invoice from [facility]. Please contact [facility] at [phone] with questions or concerns regarding your invoice.   IF you received labwork today, you will receive an invoice from [place]. Please contact [facility] at [phone] with questions or concerns regarding your invoice.   Our billing staff will not be able to assist you with questions regarding bills from these companies.  You will be contacted with the lab results as soon as they are available. The fastest way to get your results is to activate your My Chart account. Instructions are located on the last page of this paperwork. If you have not heard from [place] regarding the results in 2 weeks, please  contact this office.       Signed,   [name], MD [facility] at [facility].  04/25/19 11:48 AM

## 2019-04-26 ENCOUNTER — Encounter: Payer: Self-pay | Admitting: Family Medicine

## 2019-05-09 DIAGNOSIS — M5412 Radiculopathy, cervical region: Secondary | ICD-10-CM | POA: Diagnosis not present

## 2019-05-18 DIAGNOSIS — M5412 Radiculopathy, cervical region: Secondary | ICD-10-CM | POA: Diagnosis not present

## 2019-05-21 ENCOUNTER — Other Ambulatory Visit: Payer: Self-pay

## 2019-05-21 ENCOUNTER — Ambulatory Visit (INDEPENDENT_AMBULATORY_CARE_PROVIDER_SITE_OTHER): Payer: Medicare HMO | Admitting: Family Medicine

## 2019-05-21 VITALS — BP 114/76 | HR 81 | Temp 97.9°F | Resp 16 | Ht 70.0 in | Wt 152.0 lb

## 2019-05-21 DIAGNOSIS — R198 Other specified symptoms and signs involving the digestive system and abdomen: Secondary | ICD-10-CM

## 2019-05-21 DIAGNOSIS — R350 Frequency of micturition: Secondary | ICD-10-CM | POA: Diagnosis not present

## 2019-05-21 DIAGNOSIS — F329 Major depressive disorder, single episode, unspecified: Secondary | ICD-10-CM

## 2019-05-21 DIAGNOSIS — R634 Abnormal weight loss: Secondary | ICD-10-CM

## 2019-05-21 DIAGNOSIS — R739 Hyperglycemia, unspecified: Secondary | ICD-10-CM | POA: Diagnosis not present

## 2019-05-21 DIAGNOSIS — R69 Illness, unspecified: Secondary | ICD-10-CM | POA: Diagnosis not present

## 2019-05-21 DIAGNOSIS — F32A Depression, unspecified: Secondary | ICD-10-CM

## 2019-05-21 DIAGNOSIS — R5383 Other fatigue: Secondary | ICD-10-CM | POA: Diagnosis not present

## 2019-05-21 NOTE — Patient Instructions (Addendum)
  No med changes for depression at this time, but can discuss this further at next visit as well.   For weight loss, I will check some lab tests, but can follow up in next 2 weeks to look into next step.   As abdominal symptoms have lessened, I will hold off on CT or imaging at this time but I do want you to contact gastroenterologist to schedule appointment to discuss weight loss and irregular bowels.   Recheck in 2 weeks.   Return to the clinic or go to the nearest emergency room if any of your symptoms worsen or new symptoms occur.     If you have lab work done today you will be contacted with your lab results within the next 2 weeks.  If you have not heard from [place] then please contact us. The fastest way to get your results is to register for My Chart.   IF you received an x-ray today, you will receive an invoice from [facility]. Please contact [facility] at [phone] with questions or concerns regarding your invoice.   IF you received labwork today, you will receive an invoice from [place]. Please contact [facility] at [phone] with questions or concerns regarding your invoice.   Our billing staff will not be able to assist you with questions regarding bills from these companies.  You will be contacted with the lab results as soon as they are available. The fastest way to get your results is to activate your My Chart account. Instructions are located on the last page of this paperwork. If you have not heard from [place] regarding the results in 2 weeks, please contact this office.

## 2019-05-21 NOTE — Progress Notes (Signed)
Subjective:    Patient ID: Richard Velazquez, male    DOB: Aug 24, 1956, 63 y.o.   MRN: [id number]  HPI Richard Velazquez is a 63 y.o. male Presents today for: Chief Complaint  Patient presents with  . Weight Loss    pt states he has lost at least 10 lbs in the last 74month without trying.  .Marland KitchenHyperglycemia    ot states this is something new that is happening.   Weight loss:  Reports 12# wt loss in past 6 months - since Christmas of last year.  Eating 3 meals per day. Decreased sugar intake - stopped desert after dinner in past 8 weeks.  [facility]  Lap sigmoid colectomy 01/23/19 for recurrent diverticulitis - he only noticed a pound or two after surgery.  For years - home naked weight 162-163.  Last 6 months now at 149 on home scale.  No fevers. No night sweats. Some left lower abdominal pain over past 3-4 weeks "stitch of pain": intermittent pain.  Consistent for 2 weeks. But now sporadic, not daily. no n/v diarrhea. Feels fatigued, not well. Trouble functioning.   No blood in stool.  Erratic bowel movements. Sometimes diarrhea, sometimes constipation, wakes up in the night to have BM at times. Started past few weeks.  Has not met with GI (Dr. HBenson Norway Frequent urination, but chronic and followed by urology. Has appt in 2 weeks. Dr. BGloriann Loan Enlarged prostate.  Last PSA here last June - 4.7. no new dysuria, no hematuria. Has reported some elevated blood sugars - glucose 119 on 04/25/19  Pancreas unremarkable on abdominal CT in June 2019.  No new cough,no dyspnea.  No new focal weakness. Chronic back and neck issues - pinched nerve on right with R arm symptoms.  No results found for: TSH  Most recent HIV RNA in 10/2018 - not detected. Next appt with ID in October.  Some trouble sleeping - taking 1/2 lorazepam nightly - midnight - wakes up and trouble getting back to sleep. Still on wellbutrin 1565mBID. Does feel depressed with world events  currently, no suicidal ideation.  Neck issues affected playing piano. Had neck injection - followed by Dr. WaMina Marble- in next week or two. Some arm symptoms with trying to practice piano. [employer] his main area of interest.  No current therapy. Had in past - not interested at present. On his own - trying to maintain perspective. Feels like depression is overall stable at this time.   [facility] 05/21/2019 05/21/2019 04/25/2019 08/11/2018 07/20/2018  Decreased Interest 0 0 0 0 0  Down, Depressed, Hopeless 0 0 1 0 0  PHQ - 2 Score 0 0 1 0 0   Wt Readings from Last 3 Encounters:  05/21/19 152 lb (68.9 kg)  04/25/19 152 lb (68.9 kg)  01/25/19 164 lb 1.6 oz (74.4 kg)   Lab Results  Component Value Date   HGBA1C 5.6 04/25/2019    Patient Active Problem List   Diagnosis Date Noted  . Sigmoid diverticulitis 01/23/2019  . Anxiety 05/08/2015  . Essential (primary) hypertension 09/19/2014  . Arthritis of hand, degenerative 09/19/2014  . Amnesia 03/01/2014  . [place] disease [place]/14/2014  . Diverticulitis 09/20/2013  . Fatigue 09/20/2013  . Prostatitis 09/20/2013  . Carpal tunnel syndrome 03/01/2013  . Clinical depression 03/01/2013  . Lumbar disc disease with radiculopathy 03/01/2013   Past Medical History:  Diagnosis Date  . Arthritis  mild- hands     DDD  . Deaf, left   . Depression   . Diverticulitis   . GERD (gastroesophageal reflux disease)   . HIV infection ([place])   . Hypertension    Past Surgical History:  Procedure Laterality Date  . CARPAL TUNNEL RELEASE     Right  . COLONOSCOPY    . LAPAROSCOPIC SIGMOID COLECTOMY  01/23/2019   [id number]  . LAPAROSCOPIC SIGMOID COLECTOMY N/A 01/23/2019   Procedure: LAPAROSCOPIC SIGMOID COLECTOMY ERAS PATHWAY;  Surgeon: Stark Klein, MD;  Location: [facility];  Service: General;  Laterality: N/A;  . WISDOM TOOTH EXTRACTION     Allergies  Allergen Reactions  . Androgel [Testosterone] Anaphylaxis  . Clindamycin/Lincomycin Other  (See Comments)    Tendonitis   . Levaquin [Levofloxacin In D5w] Other (See Comments)    Tendonitis    . Cymbalta [Duloxetine Hcl] Other (See Comments)    fatigue  . Trazodone And Nefazodone Other (See Comments)    prioprism   . Sildenafil Rash    Anxiety, palpitations, no effect of drug   Prior to Admission medications   Medication Sig Start Date End Date Taking? Authorizing Provider  Ascorbic Acid (VITAMIN C) 100 MG tablet Take 500 mg by mouth 3 (three) times daily.    Yes [provider]  atazanavir-cobicistat (EVOTAZ) 300-150 MG tablet Take 1 tablet by mouth daily with breakfast.  09/22/17  Yes [provider]  buPROPion (WELLBUTRIN SR) 150 MG 12 hr tablet Take 150 mg by mouth 2 (two) times daily.   Yes [provider]  cyanocobalamin 2000 MCG tablet Take 2,000 mcg by mouth daily.   Yes [provider]  diphenoxylate-atropine (LOMOTIL) 2.5-0.025 MG tablet Take 1 tablet by mouth 4 (four) times daily as needed for diarrhea or loose stools.    Yes [provider]  fish oil-omega-3 fatty acids 1000 MG capsule Take 1 g by mouth daily.    Yes [provider]  ibuprofen (ADVIL,MOTRIN) 200 MG tablet Take 800 mg by mouth every 6 (six) hours as needed for headache or mild pain.   Yes [provider]  lamivudine (EPIVIR) 300 MG tablet Take 300 mg by mouth daily.   Yes [provider]  LISINOPRIL PO Take 5 mg by mouth.   Yes [provider]  LORazepam (ATIVAN) 1 MG tablet Take 1 mg by mouth daily.    Yes [provider]  OVER THE COUNTER MEDICATION Take 1 capsule by mouth at bedtime. Primary Digestive Supplement   Yes [provider]  OVER THE COUNTER MEDICATION Take 1 capsule by mouth at bedtime. There-Biotic Supplement   Yes [provider]  Probiotic Product (PROBIOTIC-10 ULTIMATE PO) Take 1 capsule by mouth daily.   Yes [provider]  silodosin (RAPAFLO) 8 MG CAPS capsule Take  8 mg by mouth at bedtime.    Yes [provider]  Tenofovir Alafenamide Fumarate (VEMLIDY) 25 MG TABS Take 25 mg by mouth daily.    Yes [provider]  VITAMIN D PO Take 1,000 Units by mouth daily.    Yes [provider]   Social History   Socioeconomic History  . Marital status: Single    Spouse name: Not on file  . Number of children: Not on file  . Years of education: Not on file  . Highest education level: Not on file  Occupational History  . Not on file  Social Needs  . Financial resource strain: Not on file  .  Food insecurity    Worry: Not on file    Inability: Not on file  . Transportation needs    Medical: Not on file    Non-medical: Not on file  Tobacco Use  . Smoking status: Never Smoker  . Smokeless tobacco: Never Used  Substance and Sexual Activity  . Alcohol use: Yes    Comment: occasional  . Drug use: No  . Sexual activity: Yes  Lifestyle  . Physical activity    Days per week: Not on file    Minutes per session: Not on file  . Stress: Not on file  Relationships  . Social Herbalist on phone: Not on file    Gets together: Not on file    Attends religious service: Not on file    Active member of club or organization: Not on file    Attends meetings of clubs or organizations: Not on file    Relationship status: Not on file  . Intimate partner violence    Fear of current or ex partner: Not on file    Emotionally abused: Not on file    Physically abused: Not on file    Forced sexual activity: Not on file  Other Topics Concern  . Not on file  Social History Narrative  . Not on file   Review of Systems Per HPI.     Objective:   Physical Exam Vitals signs reviewed.  Constitutional:      Appearance: He is well-developed.  HENT:     Head: Normocephalic and atraumatic.  Eyes:     Pupils: Pupils are equal, round, and reactive to light.  Neck:     Thyroid: No thyromegaly.     Vascular: No carotid bruit or JVD.   Cardiovascular:     Rate and Rhythm: Normal rate and regular rhythm.     Heart sounds: Normal heart sounds. No murmur.  Pulmonary:     Effort: Pulmonary effort is normal.     Breath sounds: Normal breath sounds. No rales.  Abdominal:     General: Abdomen is flat. Bowel sounds are normal. There is no distension.     Tenderness: There is abdominal tenderness (min LLQ, no rebound/guarding.). There is no guarding or rebound.  Skin:    General: Skin is warm and dry.  Neurological:     Mental Status: He is alert and oriented to person, place, and time.  Psychiatric:        Attention and Perception: Attention normal.        Mood and Affect: Affect is flat.        Speech: Speech normal.        Behavior: Behavior normal.        Thought Content: Thought content normal.        Cognition and Memory: Cognition normal.    Vitals:   05/21/19 0942  BP: 114/76  Pulse: 81  Resp: 16  Temp: 97.9 F (36.6 C)  TempSrc: Oral  SpO2: 96%  Weight: 152 lb (68.9 kg)  Height: _0  (1.778 m)       Assessment & Plan:  Richard Velazquez is a 63 y.o. male Loss of weight - Plan: Glucose, Random, TSH Fatigue, unspecified type - Plan: Glucose, Random, Comprehensive metabolic panel Irregular bowel habits  -Check PSA given chronic urinary frequency, prior mild elevation in PSA then may need urology evaluation if increasing.  -Check TSH, CBC, CMP to initially evaluate weight loss.  -Advised to follow-up  with gastroenterology given irregular bowel habits, prior recurrent diverticulitis now status post surgery.  Eval with GI for possible cause of weight loss, malabsorption.  -Slight discomfort left lower quadrant since surgery, minimal symptoms at present, decided to defer imaging at this time with ER/RTC precautions if worsening and need for possible repeat CT  -Recheck in the next 2 weeks to decide next step.  Hyperglycemia - Plan: Glucose, Random  -Mild elevation previously but reassuring A1c,  recheck level.  Urinary frequency - Plan: CBC, PSA  -As above will check PSA, then possible follow-up with urology  Depression, unspecified depression type  -Chronic depression.  Denies suicidal ideation.  Question whether or not this may be contributing to weight loss but he states symptoms have overall been similar in the past.  - Decided to remain on same medication regimen for now, to check TSH, discuss further the next few weeks.  Declined counseling at this time.  No orders of the defined types were placed in this encounter.  Patient Instructions    No med changes for depression at this time, but can discuss this further at next visit as well.   For weight loss, I will check some lab tests, but can follow up in next 2 weeks to look into next step.   As abdominal symptoms have lessened, I will hold off on CT or imaging at this time but I do want you to contact gastroenterologist to schedule appointment to discuss weight loss and irregular bowels.   Recheck in 2 weeks.   Return to the clinic or go to the nearest emergency room if any of your symptoms worsen or new symptoms occur.     If you have lab work done today you will be contacted with your lab results within the next 2 weeks.  If you have not heard from [place] then please contact us. The fastest way to get your results is to register for My Chart.   IF you received an x-ray today, you will receive an invoice from [facility] Rad[facility]. Please contact [facility] at [phone] with questions or concerns regarding your invoice.   IF you received labwork today, you will receive an invoice from [facility]. Please contact [facility] at [phone] with questions or concerns regarding your invoice.   Our billing staff will not be able to assist you with questions regarding bills from these companies.  You will be contacted with the lab results as soon as they are available. The fastest way to get your results is to  activate your My Chart account. Instructions are located on the last page of this paperwork. If you have not heard from [place] regarding the results in 2 weeks, please contact this office.       Signed,   Merri Ray, MD [facility].  05/22/19 10:36 AM

## 2019-05-22 ENCOUNTER — Encounter: Payer: Self-pay | Admitting: Family Medicine

## 2019-05-22 LAB — PSA: Prostate Specific Ag, Serum: 5 ng/mL — ABNORMAL HIGH (ref 0.0–4.0)

## 2019-05-22 LAB — CBC
Hematocrit: 45.9 % (ref 37.5–51.0)
Hemoglobin: 16 g/dL (ref 13.0–17.7)
MCH: 31.7 pg (ref 26.6–33.0)
MCHC: 34.9 g/dL (ref 31.5–35.7)
MCV: 91 fL (ref 79–97)
Platelets: 209 10*3/uL (ref 150–450)
RBC: 5.04 x10E6/uL (ref 4.14–5.80)
RDW: 12.2 % (ref 11.6–15.4)
WBC: 5.2 10*3/uL (ref 3.4–10.8)

## 2019-05-22 LAB — COMPREHENSIVE METABOLIC PANEL
ALT: 16 IU/L (ref 0–44)
AST: 17 IU/L (ref 0–40)
Albumin/Globulin Ratio: 1.9 (ref 1.2–2.2)
Albumin: 4.3 g/dL (ref 3.8–4.8)
Alkaline Phosphatase: 97 IU/L (ref 39–117)
BUN/Creatinine Ratio: 16 (ref 10–24)
BUN: 20 mg/dL (ref 8–27)
Bilirubin Total: 4.4 mg/dL — ABNORMAL HIGH (ref 0.0–1.2)
CO2: 21 mmol/L (ref 20–29)
Calcium: 9.8 mg/dL (ref 8.6–10.2)
Chloride: 104 mmol/L (ref 96–106)
Creatinine, Ser: 1.24 mg/dL (ref 0.76–1.27)
GFR calc Af Amer: 72 mL/min/{1.73_m2} (ref >59)
GFR calc non Af Amer: 62 mL/min/{1.73_m2} (ref >59)
Globulin, Total: 2.3 g/dL (ref 1.5–4.5)
Glucose: 115 mg/dL — ABNORMAL HIGH (ref 65–99)
Potassium: 4.4 mmol/L (ref 3.5–5.2)
Sodium: 138 mmol/L (ref 134–144)
Total Protein: 6.6 g/dL (ref 6.0–8.5)

## 2019-05-22 LAB — TSH: TSH: 1.46 u[IU]/mL (ref 0.450–4.500)

## 2019-05-23 DIAGNOSIS — R972 Elevated prostate specific antigen [PSA]: Secondary | ICD-10-CM | POA: Diagnosis not present

## 2019-05-29 DIAGNOSIS — N401 Enlarged prostate with lower urinary tract symptoms: Secondary | ICD-10-CM | POA: Diagnosis not present

## 2019-05-29 DIAGNOSIS — R972 Elevated prostate specific antigen [PSA]: Secondary | ICD-10-CM | POA: Diagnosis not present

## 2019-05-29 DIAGNOSIS — R351 Nocturia: Secondary | ICD-10-CM | POA: Diagnosis not present

## 2019-05-29 DIAGNOSIS — R3912 Poor urinary stream: Secondary | ICD-10-CM | POA: Diagnosis not present

## 2019-06-04 DIAGNOSIS — M5412 Radiculopathy, cervical region: Secondary | ICD-10-CM | POA: Diagnosis not present

## 2019-06-06 ENCOUNTER — Ambulatory Visit: Payer: Medicare HMO | Admitting: Family Medicine

## 2019-06-11 DIAGNOSIS — R634 Abnormal weight loss: Secondary | ICD-10-CM | POA: Diagnosis not present

## 2019-06-11 DIAGNOSIS — K5900 Constipation, unspecified: Secondary | ICD-10-CM | POA: Diagnosis not present

## 2019-06-11 DIAGNOSIS — R69 Illness, unspecified: Secondary | ICD-10-CM | POA: Diagnosis not present

## 2019-06-17 DIAGNOSIS — M5412 Radiculopathy, cervical region: Secondary | ICD-10-CM | POA: Diagnosis not present

## 2019-07-09 DIAGNOSIS — K5900 Constipation, unspecified: Secondary | ICD-10-CM | POA: Diagnosis not present

## 2019-07-09 DIAGNOSIS — R634 Abnormal weight loss: Secondary | ICD-10-CM | POA: Diagnosis not present

## 2019-07-11 MED ORDER — VEMLIDY 25 MG TABLET
ORAL_TABLET | Freq: Every day | ORAL | 10 refills | 30.00000 days | Status: CP
Start: 2019-07-11 — End: ?

## 2019-07-18 DIAGNOSIS — M5412 Radiculopathy, cervical region: Secondary | ICD-10-CM | POA: Diagnosis not present

## 2019-07-18 DIAGNOSIS — R69 Illness, unspecified: Secondary | ICD-10-CM | POA: Diagnosis not present

## 2019-07-26 ENCOUNTER — Ambulatory Visit: Payer: Medicare HMO | Admitting: Family Medicine

## 2019-07-26 ENCOUNTER — Other Ambulatory Visit: Payer: Self-pay

## 2019-07-26 ENCOUNTER — Encounter: Payer: Self-pay | Admitting: Family Medicine

## 2019-08-16 DIAGNOSIS — R69 Illness, unspecified: Secondary | ICD-10-CM | POA: Diagnosis not present

## 2019-08-16 DIAGNOSIS — F329 Major depressive disorder, single episode, unspecified: Secondary | ICD-10-CM | POA: Diagnosis not present

## 2019-08-16 DIAGNOSIS — Z77011 Contact with and (suspected) exposure to lead: Secondary | ICD-10-CM | POA: Diagnosis not present

## 2019-08-16 DIAGNOSIS — Z131 Encounter for screening for diabetes mellitus: Secondary | ICD-10-CM | POA: Diagnosis not present

## 2019-08-16 DIAGNOSIS — Z79899 Other long term (current) drug therapy: Secondary | ICD-10-CM | POA: Diagnosis not present

## 2019-08-16 DIAGNOSIS — R5383 Other fatigue: Secondary | ICD-10-CM | POA: Diagnosis not present

## 2019-08-16 DIAGNOSIS — F419 Anxiety disorder, unspecified: Secondary | ICD-10-CM | POA: Diagnosis not present

## 2019-08-16 DIAGNOSIS — Z2821 Immunization not carried out because of patient refusal: Secondary | ICD-10-CM | POA: Diagnosis not present

## 2019-08-16 DIAGNOSIS — I10 Essential (primary) hypertension: Secondary | ICD-10-CM | POA: Diagnosis not present

## 2019-08-18 DIAGNOSIS — M5412 Radiculopathy, cervical region: Secondary | ICD-10-CM | POA: Diagnosis not present

## 2019-09-07 DIAGNOSIS — Z2821 Immunization not carried out because of patient refusal: Secondary | ICD-10-CM | POA: Diagnosis not present

## 2019-09-07 DIAGNOSIS — R69 Illness, unspecified: Secondary | ICD-10-CM | POA: Diagnosis not present

## 2019-09-07 DIAGNOSIS — Z23 Encounter for immunization: Secondary | ICD-10-CM | POA: Diagnosis not present

## 2019-09-12 DIAGNOSIS — B20 Human immunodeficiency virus [HIV] disease: Principal | ICD-10-CM

## 2019-09-12 MED ORDER — EVOTAZ 300 MG-150 MG TABLET: tablet | 11 refills | 0 days | Status: AC

## 2019-09-17 DIAGNOSIS — M5412 Radiculopathy, cervical region: Secondary | ICD-10-CM | POA: Diagnosis not present

## 2019-10-09 DIAGNOSIS — Z0000 Encounter for general adult medical examination without abnormal findings: Secondary | ICD-10-CM | POA: Diagnosis not present

## 2019-10-09 DIAGNOSIS — R69 Illness, unspecified: Secondary | ICD-10-CM | POA: Diagnosis not present

## 2019-10-09 DIAGNOSIS — F329 Major depressive disorder, single episode, unspecified: Secondary | ICD-10-CM | POA: Diagnosis not present

## 2019-10-09 DIAGNOSIS — L989 Disorder of the skin and subcutaneous tissue, unspecified: Secondary | ICD-10-CM | POA: Diagnosis not present

## 2019-10-18 DIAGNOSIS — M5412 Radiculopathy, cervical region: Secondary | ICD-10-CM | POA: Diagnosis not present

## 2019-10-24 DIAGNOSIS — R69 Illness, unspecified: Secondary | ICD-10-CM | POA: Diagnosis not present

## 2019-11-07 DIAGNOSIS — H0102B Squamous blepharitis left eye, upper and lower eyelids: Secondary | ICD-10-CM | POA: Diagnosis not present

## 2019-11-07 DIAGNOSIS — H0102A Squamous blepharitis right eye, upper and lower eyelids: Secondary | ICD-10-CM | POA: Diagnosis not present

## 2019-11-07 DIAGNOSIS — H2513 Age-related nuclear cataract, bilateral: Secondary | ICD-10-CM | POA: Diagnosis not present

## 2019-11-07 DIAGNOSIS — H35363 Drusen (degenerative) of macula, bilateral: Secondary | ICD-10-CM | POA: Diagnosis not present

## 2019-11-07 DIAGNOSIS — H43811 Vitreous degeneration, right eye: Secondary | ICD-10-CM | POA: Diagnosis not present

## 2019-11-08 DIAGNOSIS — M5412 Radiculopathy, cervical region: Secondary | ICD-10-CM | POA: Diagnosis not present

## 2019-11-22 ENCOUNTER — Encounter
Admit: 2019-11-22 | Discharge: 2019-11-23 | Payer: MEDICARE | Attending: Infectious Disease | Primary: Infectious Disease

## 2019-11-22 DIAGNOSIS — R5382 Chronic fatigue, unspecified: Secondary | ICD-10-CM | POA: Diagnosis not present

## 2019-11-22 DIAGNOSIS — R69 Illness, unspecified: Secondary | ICD-10-CM | POA: Diagnosis not present

## 2019-11-22 DIAGNOSIS — R413 Other amnesia: Secondary | ICD-10-CM | POA: Diagnosis not present

## 2019-11-22 DIAGNOSIS — I10 Essential (primary) hypertension: Secondary | ICD-10-CM | POA: Diagnosis not present

## 2019-11-22 DIAGNOSIS — F419 Anxiety disorder, unspecified: Principal | ICD-10-CM

## 2019-11-22 DIAGNOSIS — F3289 Other specified depressive episodes: Principal | ICD-10-CM

## 2019-11-22 DIAGNOSIS — B20 Human immunodeficiency virus [HIV] disease: Principal | ICD-10-CM

## 2019-12-13 ENCOUNTER — Telehealth: Payer: Self-pay

## 2019-12-13 NOTE — Telephone Encounter (Signed)
Received OV notes from [facility].  MD has reviewed but it appears pt may have switched to [facility].  L/m to confirm if this is the case and, if so, which office so that we can forward notes from [facility].

## 2020-01-15 DIAGNOSIS — F419 Anxiety disorder, unspecified: Principal | ICD-10-CM

## 2020-01-15 MED ORDER — LORAZEPAM 1 MG TABLET
ORAL_TABLET | Freq: Every day | ORAL | 5 refills | 15.00000 days | Status: CP
Start: 2020-01-15 — End: ?

## 2020-02-14 ENCOUNTER — Ambulatory Visit: Payer: Medicare HMO | Attending: Internal Medicine

## 2020-02-14 DIAGNOSIS — Z23 Encounter for immunization: Secondary | ICD-10-CM

## 2020-02-14 NOTE — Progress Notes (Signed)
   Covid-19 Vaccination Clinic  Name:  Richard Velazquez    MRN: [id number] DOB: 1956/09/05  02/14/2020  [name] was observed post Covid-19 immunization for 30 minutes based on pre-vaccination screening without incident. He was provided with Vaccine Information Sheet and instruction to access the V-Safe system.   [name] was instructed to call 911 with any severe reactions post vaccine: Marland Kitchen Difficulty breathing  . Swelling of face and throat  . A fast heartbeat  . A bad rash all over body  . Dizziness and weakness   Immunizations Administered    Name Date Dose VIS Date Route   Pfizer COVID-19 Vaccine 02/14/2020 10:29 AM 0.3 mL 11/02/2019 Intramuscular   Manufacturer: [employer], [employer]   Lot: [id number]   NDC: [address]-2

## 2020-03-10 ENCOUNTER — Ambulatory Visit: Payer: Medicare HMO | Attending: Internal Medicine

## 2020-03-10 DIAGNOSIS — Z23 Encounter for immunization: Secondary | ICD-10-CM

## 2020-03-10 NOTE — Progress Notes (Signed)
   Covid-19 Vaccination Clinic  Name:  Richard Velazquez    MRN: [id number] DOB: [birth year]  03/10/2020  [name] was observed post Covid-19 immunization for 30 minutes based on pre-vaccination screening without incident. He was provided with Vaccine Information Sheet and instruction to access the V-Safe system.   [name] was instructed to call 911 with any severe reactions post vaccine: Marland Kitchen Difficulty breathing  . Swelling of face and throat  . A fast heartbeat  . A bad rash all over body  . Dizziness and weakness   Immunizations Administered    Name Date Dose VIS Date Route   Pfizer COVID-19 Vaccine 03/10/2020  9:58 AM 0.3 mL 01/16/2019 Intramuscular   Manufacturer: [employer], [employer]   Lot: [id number]   NDC: [address]-0

## 2020-03-31 DIAGNOSIS — B20 Human immunodeficiency virus [HIV] disease: Principal | ICD-10-CM

## 2020-04-01 MED ORDER — LAMIVUDINE 300 MG TABLET
ORAL_TABLET | 3 refills | 0 days | Status: CP
Start: 2020-04-01 — End: ?

## 2020-04-01 MED ORDER — LISINOPRIL 5 MG TABLET
ORAL_TABLET | 3 refills | 0 days | Status: CP
Start: 2020-04-01 — End: ?

## 2020-05-25 DIAGNOSIS — B20 Human immunodeficiency virus [HIV] disease: Principal | ICD-10-CM

## 2020-05-29 ENCOUNTER — Telehealth
Admit: 2020-05-29 | Discharge: 2020-05-30 | Payer: MEDICARE | Attending: Infectious Disease | Primary: Infectious Disease

## 2020-05-29 DIAGNOSIS — L309 Dermatitis, unspecified: Principal | ICD-10-CM

## 2020-05-29 DIAGNOSIS — Z1159 Encounter for screening for other viral diseases: Principal | ICD-10-CM

## 2020-05-29 DIAGNOSIS — R634 Abnormal weight loss: Principal | ICD-10-CM

## 2020-05-29 DIAGNOSIS — F3289 Other specified depressive episodes: Principal | ICD-10-CM

## 2020-05-29 DIAGNOSIS — B20 Human immunodeficiency virus [HIV] disease: Principal | ICD-10-CM

## 2020-05-29 DIAGNOSIS — N419 Inflammatory disease of prostate, unspecified: Principal | ICD-10-CM

## 2020-05-29 DIAGNOSIS — K5792 Diverticulitis of intestine, part unspecified, without perforation or abscess without bleeding: Principal | ICD-10-CM

## 2020-05-29 DIAGNOSIS — I10 Essential (primary) hypertension: Principal | ICD-10-CM

## 2020-05-29 MED ORDER — CLOBETASOL 0.05 % TOPICAL OINTMENT
Freq: Two times a day (BID) | TOPICAL | 3 refills | 0.00000 days | Status: CP
Start: 2020-05-29 — End: 2021-05-29

## 2020-05-31 DIAGNOSIS — F3289 Other specified depressive episodes: Principal | ICD-10-CM

## 2020-05-31 DIAGNOSIS — F419 Anxiety disorder, unspecified: Principal | ICD-10-CM

## 2020-05-31 DIAGNOSIS — F329 Major depressive disorder, single episode, unspecified: Principal | ICD-10-CM

## 2020-06-02 MED ORDER — BUPROPION HCL SR 150 MG TABLET,12 HR SUSTAINED-RELEASE
ORAL_TABLET | 3 refills | 0 days | Status: CP
Start: 2020-06-02 — End: ?

## 2020-06-03 DIAGNOSIS — B20 Human immunodeficiency virus [HIV] disease: Principal | ICD-10-CM

## 2020-06-04 MED ORDER — VEMLIDY 25 MG TABLET
ORAL_TABLET | Freq: Every day | ORAL | 10 refills | 30.00000 days | Status: CP
Start: 2020-06-04 — End: ?

## 2020-07-31 DIAGNOSIS — B20 Human immunodeficiency virus [HIV] disease: Principal | ICD-10-CM

## 2020-08-21 DIAGNOSIS — F419 Anxiety disorder, unspecified: Principal | ICD-10-CM

## 2020-08-21 MED ORDER — LORAZEPAM 1 MG TABLET
ORAL_TABLET | ORAL | 0 refills | 0.00000 days | Status: CP
Start: 2020-08-21 — End: ?

## 2020-09-10 DIAGNOSIS — B20 Human immunodeficiency virus [HIV] disease: Principal | ICD-10-CM

## 2020-09-10 MED ORDER — EVOTAZ 300 MG-150 MG TABLET
ORAL_TABLET | ORAL | 11 refills | 0.00000 days | Status: CP
Start: 2020-09-10 — End: ?

## 2020-11-13 DIAGNOSIS — B20 Human immunodeficiency virus [HIV] disease: Principal | ICD-10-CM

## 2020-11-20 ENCOUNTER — Encounter
Admit: 2020-11-20 | Discharge: 2020-11-21 | Payer: MEDICARE | Attending: Infectious Disease | Primary: Infectious Disease

## 2020-11-20 DIAGNOSIS — K5792 Diverticulitis of intestine, part unspecified, without perforation or abscess without bleeding: Principal | ICD-10-CM

## 2020-11-20 DIAGNOSIS — Z7189 Other specified counseling: Principal | ICD-10-CM

## 2020-11-20 DIAGNOSIS — R5382 Chronic fatigue, unspecified: Principal | ICD-10-CM

## 2020-11-20 DIAGNOSIS — M19042 Primary osteoarthritis, left hand: Principal | ICD-10-CM

## 2020-11-20 DIAGNOSIS — M19041 Primary osteoarthritis, right hand: Principal | ICD-10-CM

## 2020-11-20 DIAGNOSIS — I10 Essential (primary) hypertension: Principal | ICD-10-CM

## 2020-11-20 DIAGNOSIS — F419 Anxiety disorder, unspecified: Principal | ICD-10-CM

## 2020-11-20 DIAGNOSIS — B20 Human immunodeficiency virus [HIV] disease: Principal | ICD-10-CM

## 2020-11-20 MED ORDER — VEMLIDY 25 MG TABLET
ORAL_TABLET | Freq: Every day | ORAL | 10 refills | 30 days | Status: CP
Start: 2020-11-20 — End: ?

## 2020-12-25 MED ORDER — LISINOPRIL 5 MG TABLET
ORAL_TABLET | 3 refills | 0 days | Status: CP
Start: 2020-12-25 — End: ?

## 2021-01-01 DIAGNOSIS — B20 Human immunodeficiency virus [HIV] disease: Principal | ICD-10-CM

## 2021-01-01 MED ORDER — LAMIVUDINE 300 MG TABLET
ORAL_TABLET | 3 refills | 0 days | Status: CP
Start: 2021-01-01 — End: ?

## 2021-05-21 ENCOUNTER — Ambulatory Visit
Admit: 2021-05-21 | Discharge: 2021-05-22 | Payer: MEDICARE | Attending: Infectious Disease | Primary: Infectious Disease

## 2021-05-21 DIAGNOSIS — L739 Follicular disorder, unspecified: Principal | ICD-10-CM

## 2021-05-21 DIAGNOSIS — M19041 Primary osteoarthritis, right hand: Principal | ICD-10-CM

## 2021-05-21 DIAGNOSIS — R5382 Chronic fatigue, unspecified: Principal | ICD-10-CM

## 2021-05-21 DIAGNOSIS — R413 Other amnesia: Principal | ICD-10-CM

## 2021-05-21 DIAGNOSIS — M5116 Intervertebral disc disorders with radiculopathy, lumbar region: Principal | ICD-10-CM

## 2021-05-21 DIAGNOSIS — F3289 Other specified depressive episodes: Principal | ICD-10-CM

## 2021-05-21 DIAGNOSIS — F419 Anxiety disorder, unspecified: Principal | ICD-10-CM

## 2021-05-21 DIAGNOSIS — M19042 Primary osteoarthritis, left hand: Principal | ICD-10-CM

## 2021-05-21 DIAGNOSIS — K5792 Diverticulitis of intestine, part unspecified, without perforation or abscess without bleeding: Principal | ICD-10-CM

## 2021-05-21 DIAGNOSIS — I10 Essential (primary) hypertension: Principal | ICD-10-CM

## 2021-05-21 DIAGNOSIS — Z23 Encounter for immunization: Principal | ICD-10-CM

## 2021-05-21 DIAGNOSIS — Z7189 Other specified counseling: Principal | ICD-10-CM

## 2021-05-21 DIAGNOSIS — B20 Human immunodeficiency virus [HIV] disease: Principal | ICD-10-CM

## 2021-05-21 MED ORDER — MUPIROCIN CALCIUM 2 % TOPICAL CREAM
Freq: Two times a day (BID) | TOPICAL | 2 refills | 0.00000 days | Status: CP
Start: 2021-05-21 — End: 2021-05-28

## 2021-05-21 MED ORDER — DOXYCYCLINE HYCLATE 100 MG TABLET
ORAL_TABLET | Freq: Two times a day (BID) | ORAL | 0 refills | 10.00000 days | Status: CP
Start: 2021-05-21 — End: ?

## 2021-05-23 DIAGNOSIS — F3289 Other specified depressive episodes: Principal | ICD-10-CM

## 2021-05-23 DIAGNOSIS — F32A Depression, unspecified depression type: Principal | ICD-10-CM

## 2021-05-23 DIAGNOSIS — F419 Anxiety disorder, unspecified: Principal | ICD-10-CM

## 2021-05-27 MED ORDER — BUPROPION HCL SR 150 MG TABLET,12 HR SUSTAINED-RELEASE
ORAL_TABLET | 3 refills | 0 days | Status: CP
Start: 2021-05-27 — End: ?

## 2021-08-30 DIAGNOSIS — B20 Human immunodeficiency virus [HIV] disease: Principal | ICD-10-CM

## 2021-08-31 MED ORDER — EVOTAZ 300 MG-150 MG TABLET
ORAL_TABLET | 10 refills | 0 days | Status: CP
Start: 2021-08-31 — End: 2021-09-30

## 2021-11-30 DIAGNOSIS — B20 Human immunodeficiency virus [HIV] disease: Principal | ICD-10-CM

## 2021-11-30 DIAGNOSIS — I10 Essential (primary) hypertension: Principal | ICD-10-CM

## 2021-12-14 DIAGNOSIS — B20 Human immunodeficiency virus [HIV] disease: Principal | ICD-10-CM

## 2021-12-14 MED ORDER — LAMIVUDINE 300 MG TABLET
ORAL_TABLET | 3 refills | 0 days | Status: CP
Start: 2021-12-14 — End: ?

## 2021-12-16 DIAGNOSIS — B20 Human immunodeficiency virus [HIV] disease: Principal | ICD-10-CM

## 2021-12-17 ENCOUNTER — Ambulatory Visit
Admit: 2021-12-17 | Discharge: 2021-12-18 | Payer: MEDICARE | Attending: Infectious Disease | Primary: Infectious Disease

## 2021-12-17 DIAGNOSIS — Z7189 Other specified counseling: Principal | ICD-10-CM

## 2021-12-17 DIAGNOSIS — F419 Anxiety disorder, unspecified: Principal | ICD-10-CM

## 2021-12-17 DIAGNOSIS — M19042 Primary osteoarthritis, left hand: Principal | ICD-10-CM

## 2021-12-17 DIAGNOSIS — B20 Human immunodeficiency virus [HIV] disease: Principal | ICD-10-CM

## 2021-12-17 DIAGNOSIS — M19041 Primary osteoarthritis, right hand: Principal | ICD-10-CM

## 2021-12-17 DIAGNOSIS — Z131 Encounter for screening for diabetes mellitus: Principal | ICD-10-CM

## 2021-12-17 DIAGNOSIS — R799 Abnormal finding of blood chemistry, unspecified: Principal | ICD-10-CM

## 2021-12-17 DIAGNOSIS — R413 Other amnesia: Principal | ICD-10-CM

## 2021-12-17 DIAGNOSIS — F3289 Other specified depressive episodes: Principal | ICD-10-CM

## 2021-12-17 DIAGNOSIS — K5792 Diverticulitis of intestine, part unspecified, without perforation or abscess without bleeding: Principal | ICD-10-CM

## 2021-12-17 DIAGNOSIS — R5382 Chronic fatigue, unspecified: Principal | ICD-10-CM

## 2021-12-17 MED ORDER — LORAZEPAM 1 MG TABLET
ORAL_TABLET | Freq: Every evening | ORAL | 3 refills | 30.00000 days | Status: CP
Start: 2021-12-17 — End: ?

## 2021-12-21 DIAGNOSIS — B20 Human immunodeficiency virus [HIV] disease: Principal | ICD-10-CM

## 2021-12-21 MED ORDER — VEMLIDY 25 MG TABLET
ORAL_TABLET | Freq: Every day | ORAL | 10 refills | 30 days | Status: CP
Start: 2021-12-21 — End: ?

## 2022-05-20 ENCOUNTER — Telehealth
Admit: 2022-05-20 | Discharge: 2022-05-21 | Payer: MEDICARE | Attending: Infectious Disease | Primary: Infectious Disease

## 2022-05-20 DIAGNOSIS — R5382 Chronic fatigue, unspecified: Principal | ICD-10-CM

## 2022-05-20 DIAGNOSIS — Z7189 Other specified counseling: Principal | ICD-10-CM

## 2022-05-20 DIAGNOSIS — R413 Other amnesia: Principal | ICD-10-CM

## 2022-05-20 DIAGNOSIS — M19042 Primary osteoarthritis, left hand: Principal | ICD-10-CM

## 2022-05-20 DIAGNOSIS — M19041 Primary osteoarthritis, right hand: Principal | ICD-10-CM

## 2022-05-20 DIAGNOSIS — F3289 Other specified depressive episodes: Principal | ICD-10-CM

## 2022-05-20 DIAGNOSIS — B20 Human immunodeficiency virus [HIV] disease: Principal | ICD-10-CM

## 2022-06-30 ENCOUNTER — Encounter (HOSPITAL_COMMUNITY): Payer: Self-pay

## 2022-06-30 ENCOUNTER — Emergency Department (HOSPITAL_COMMUNITY): Payer: Medicare HMO

## 2022-06-30 ENCOUNTER — Other Ambulatory Visit: Payer: Self-pay

## 2022-06-30 ENCOUNTER — Emergency Department (HOSPITAL_COMMUNITY)
Admission: EM | Admit: 2022-06-30 | Discharge: 2022-06-30 | Disposition: A | Discharge status: Home / Self Care | Payer: Medicare HMO | Attending: Emergency Medicine | Admitting: Emergency Medicine

## 2022-06-30 DIAGNOSIS — R110 Nausea: Secondary | ICD-10-CM | POA: Insufficient documentation

## 2022-06-30 DIAGNOSIS — I10 Essential (primary) hypertension: Secondary | ICD-10-CM | POA: Insufficient documentation

## 2022-06-30 DIAGNOSIS — Z21 Asymptomatic human immunodeficiency virus [HIV] infection status: Secondary | ICD-10-CM | POA: Insufficient documentation

## 2022-06-30 DIAGNOSIS — Z79899 Other long term (current) drug therapy: Secondary | ICD-10-CM | POA: Diagnosis not present

## 2022-06-30 DIAGNOSIS — R42 Dizziness and giddiness: Secondary | ICD-10-CM | POA: Diagnosis present

## 2022-06-30 DIAGNOSIS — R519 Headache, unspecified: Secondary | ICD-10-CM | POA: Diagnosis not present

## 2022-06-30 LAB — COMPREHENSIVE METABOLIC PANEL
ALT: 26 U/L (ref 0–44)
AST: 21 U/L (ref 15–41)
Albumin: 4 g/dL (ref 3.5–5.0)
Alkaline Phosphatase: 94 U/L (ref 38–126)
Anion gap: 9 (ref 5–15)
BUN: 23 mg/dL (ref 8–23)
CO2: 21 mmol/L — ABNORMAL LOW (ref 22–32)
Calcium: 9.4 mg/dL (ref 8.9–10.3)
Chloride: 106 mmol/L (ref 98–111)
Creatinine, Ser: 1 mg/dL (ref 0.61–1.24)
GFR, Estimated: >60 mL/min (ref >60)
Glucose, Bld: 131 mg/dL — ABNORMAL HIGH (ref 70–99)
Potassium: 3.8 mmol/L (ref 3.5–5.1)
Sodium: 136 mmol/L (ref 135–145)
Total Bilirubin: 3.3 mg/dL — ABNORMAL HIGH (ref 0.3–1.2)
Total Protein: 6.9 g/dL (ref 6.5–8.1)

## 2022-06-30 LAB — CBC WITH DIFFERENTIAL/PLATELET
Abs Immature Granulocytes: 0.02 10*3/uL (ref 0.00–0.07)
Basophils Absolute: 0 10*3/uL (ref 0.0–0.1)
Basophils Relative: 1 %
Eosinophils Absolute: 0.1 10*3/uL (ref 0.0–0.5)
Eosinophils Relative: 2 %
HCT: 45.4 % (ref 39.0–52.0)
Hemoglobin: 16.1 g/dL (ref 13.0–17.0)
Immature Granulocytes: 0 %
Lymphocytes Relative: 32 %
Lymphs Abs: 1.8 10*3/uL (ref 0.7–4.0)
MCH: 31.1 pg (ref 26.0–34.0)
MCHC: 35.5 g/dL (ref 30.0–36.0)
MCV: 87.8 fL (ref 80.0–100.0)
Monocytes Absolute: 0.5 10*3/uL (ref 0.1–1.0)
Monocytes Relative: 9 %
Neutro Abs: 3.1 10*3/uL (ref 1.7–7.7)
Neutrophils Relative %: 56 %
Platelets: 202 10*3/uL (ref 150–400)
RBC: 5.17 MIL/uL (ref 4.22–5.81)
RDW: 12 % (ref 11.5–15.5)
WBC: 5.5 10*3/uL (ref 4.0–10.5)
nRBC: 0 % (ref 0.0–0.2)

## 2022-06-30 LAB — URINALYSIS, ROUTINE W REFLEX MICROSCOPIC
Bilirubin Urine: NEGATIVE
Glucose, UA: NEGATIVE mg/dL
Hgb urine dipstick: NEGATIVE
Ketones, ur: NEGATIVE mg/dL
Leukocytes,Ua: NEGATIVE
Nitrite: NEGATIVE
Protein, ur: NEGATIVE mg/dL
Specific Gravity, Urine: 1.014 (ref 1.005–1.030)
pH: 7 (ref 5.0–8.0)

## 2022-06-30 LAB — TROPONIN I (HIGH SENSITIVITY)
Troponin I (High Sensitivity): 3 ng/L (ref <18)
Troponin I (High Sensitivity): 3 ng/L (ref <18)

## 2022-06-30 MED ORDER — SODIUM CHLORIDE 0.9 % IV BOLUS
1000.0000 mL | Freq: Once | INTRAVENOUS | Status: AC
  Administered 2022-06-30: 1000 mL via INTRAVENOUS

## 2022-06-30 MED ORDER — ONDANSETRON HCL 4 MG/2ML IJ SOLN
4.0000 mg | Freq: Once | INTRAMUSCULAR | Status: AC
  Administered 2022-06-30: 4 mg via INTRAVENOUS
  Filled 2022-06-30: qty 2

## 2022-06-30 MED ORDER — MECLIZINE HCL 25 MG PO TABS
25.0000 mg | ORAL_TABLET | Freq: Once | ORAL | Status: AC
  Administered 2022-06-30: 25 mg via ORAL
  Filled 2022-06-30: qty 1

## 2022-06-30 MED ORDER — MECLIZINE HCL 25 MG PO TABS
25.0000 mg | ORAL_TABLET | Freq: Three times a day (TID) | ORAL | 0 refills | Status: AC | PRN
Start: 2022-06-30 — End: ?

## 2022-06-30 NOTE — ED Notes (Signed)
Pt roomed and asked to change into a gown.

## 2022-06-30 NOTE — ED Notes (Signed)
Pt aware urine sample is need; urinal at bedside .

## 2022-06-30 NOTE — ED Provider Notes (Signed)
[facility]-EMERGENCY DEPT Provider Note   CSN: [id number] Arrival date & time: 06/30/22  1017     History  Chief Complaint  Patient presents with   Dizziness   Nausea   Headache    Richard Velazquez is a 66 y.o. male.  Patient is a 66 yo male with pmh of HIV, depression, diverticulitis with colosotmy bag, and and hypertension presenting for dizziness. Patient states he awoke from sleep feeling "off". States he attempted to stand near the side of the bed and had severe dizziness and nausea that caused him to collapse to the bed. Denies LOC. Admits to headache.   Currently taking doxycyline 100 mg bid x 7 days for epididymitis. Initial symptoms of testicular tenderness and low back pain has improved but not completley resolved.   The history is provided by the patient. No language interpreter was used.  Dizziness Associated symptoms: headaches   Associated symptoms: no chest pain, no palpitations, no shortness of breath and no vomiting   Headache Associated symptoms: dizziness   Associated symptoms: no abdominal pain, no back pain, no cough, no ear pain, no eye pain, no fever, no seizures, no sore throat and no vomiting        Home Medications Prior to Admission medications   Medication Sig Start Date End Date Taking? Authorizing Provider  meclizine (ANTIVERT) 25 MG tablet Take 1 tablet (25 mg total) by mouth 3 (three) times daily as needed for dizziness. 06/30/22  Yes Benjiman Core, MD  Ascorbic Acid (VITAMIN C) 100 MG tablet Take 500 mg by mouth 3 (three) times daily.     [provider]  atazanavir-cobicistat (EVOTAZ) 300-150 MG tablet Take 1 tablet by mouth daily with breakfast.  09/22/17   [provider]  buPROPion (WELLBUTRIN SR) 150 MG 12 hr tablet Take 150 mg by mouth 2 (two) times daily.    [provider]  cyanocobalamin 2000 MCG tablet Take 2,000 mcg by mouth daily.    [provider]   diphenoxylate-atropine (LOMOTIL) 2.5-0.025 MG tablet Take 1 tablet by mouth 4 (four) times daily as needed for diarrhea or loose stools.     [provider]  fish oil-omega-3 fatty acids 1000 MG capsule Take 1 g by mouth daily.     [provider]  ibuprofen (ADVIL,MOTRIN) 200 MG tablet Take 800 mg by mouth every 6 (six) hours as needed for headache or mild pain.    [provider]  lamivudine (EPIVIR) 300 MG tablet Take 300 mg by mouth daily.    [provider]  LISINOPRIL PO Take 5 mg by mouth.    [provider]  LORazepam (ATIVAN) 1 MG tablet Take 1 mg by mouth daily.     [provider]  OVER THE COUNTER MEDICATION Take 1 capsule by mouth at bedtime. Primary Digestive Supplement    [provider]  OVER THE COUNTER MEDICATION Take 1 capsule by mouth at bedtime. There-Biotic Supplement    [provider]  Probiotic Product (PROBIOTIC-10 ULTIMATE PO) Take 1 capsule by mouth daily.    [provider]  silodosin (RAPAFLO) 8 MG CAPS capsule Take 8 mg by mouth at bedtime.     [provider]  Tenofovir Alafenamide Fumarate (VEMLIDY) 25 MG TABS Take 25 mg by mouth daily.     [provider]  VITAMIN D PO Take 1,000 Units by mouth daily.     [provider]      Allergies  Androgel [testosterone], Clindamycin/lincomycin, Levaquin [levofloxacin in d5w], Cymbalta [duloxetine hcl], Trazodone and nefazodone, and Sildenafil    Review of Systems   Review of Systems  Constitutional:  Negative for chills and fever.  HENT:  Negative for ear pain and sore throat.   Eyes:  Negative for pain and visual disturbance.  Respiratory:  Negative for cough and shortness of breath.   Cardiovascular:  Negative for chest pain and palpitations.  Gastrointestinal:  Negative for abdominal pain and vomiting.  Genitourinary:  Negative for dysuria and hematuria.  Musculoskeletal:  Negative for arthralgias and  back pain.  Skin:  Negative for color change and rash.  Neurological:  Positive for dizziness and headaches. Negative for seizures and syncope.  All other systems reviewed and are negative.   Physical Exam Updated Vital Signs BP (!) 135/101   Pulse 71   Temp (!) 97.4 F (36.3 C) (Oral)   Resp 17   Ht 5\' 10"  (1.778 m)   Wt 75.8 kg   SpO2 97%   BMI 23.96 kg/m  Physical Exam Vitals and nursing note reviewed.  Constitutional:      General: He is not in acute distress.    Appearance: He is well-developed.  HENT:     Head: Normocephalic and atraumatic.  Eyes:     Conjunctiva/sclera: Conjunctivae normal.  Cardiovascular:     Rate and Rhythm: Normal rate and regular rhythm.     Heart sounds: No murmur heard. Pulmonary:     Effort: Pulmonary effort is normal. No respiratory distress.     Breath sounds: Normal breath sounds.  Abdominal:     Palpations: Abdomen is soft.     Tenderness: There is no abdominal tenderness.  Musculoskeletal:        General: No swelling.     Cervical back: Neck supple.  Skin:    General: Skin is warm and dry.     Capillary Refill: Capillary refill takes less than 2 seconds.  Neurological:     Mental Status: He is alert.     GCS: GCS eye subscore is 4. GCS verbal subscore is 5. GCS motor subscore is 6.     Cranial Nerves: Cranial nerves 2-12 are intact.     Sensory: Sensation is intact.     Motor: Motor function is intact.     Coordination: Coordination is intact.  Psychiatric:        Mood and Affect: Mood normal.     ED Results / Procedures / Treatments   Labs (all labs ordered are listed, but only abnormal results are displayed) Labs Reviewed  COMPREHENSIVE METABOLIC PANEL - Abnormal; Notable for the following components:      Result Value   CO2 21 (*)    Glucose, Bld 131 (*)    Total Bilirubin 3.3 (*)    All other components within normal limits  CBC WITH DIFFERENTIAL/PLATELET  URINALYSIS, ROUTINE W REFLEX MICROSCOPIC  TROPONIN I  (HIGH SENSITIVITY)  TROPONIN I (HIGH SENSITIVITY)    EKG EKG Interpretation  Date/Time:  Wednesday June 30 2022 09:44:36 EDT Ventricular Rate:  81 PR Interval:  139 QRS Duration: 125 QT Interval:  404 QTC Calculation: 469 R Axis:   60 Text Interpretation: Sinus rhythm Nonspecific intraventricular conduction delay Confirmed by 02-04-2003 ([id number]) on 06/30/2022 12:45:04 PM  Radiology No results found.  Procedures Procedures    Medications Ordered in ED Medications  sodium chloride 0.9 % bolus 1,000 mL (0 mLs Intravenous Stopped 06/30/22 1218)  ondansetron (ZOFRAN) injection 4  mg (4 mg Intravenous Given 06/30/22 1052)  meclizine (ANTIVERT) tablet 25 mg (25 mg Oral Given 06/30/22 1406)    ED Course/ Medical Decision Making/ A&P                           Medical Decision Making Amount and/or Complexity of Data Reviewed Radiology: ordered.  Risk Prescription drug management.   Patient is a 66 yo male with pmh of HIV, depression, diverticulitis with colosotmy bag, and and hypertension presenting for dizziness. Pt is Aox3, no acute distress, afebrile with stable vitals. Physical exam demonstrates no neurovascular deficits. No spinal pain on exam CT head and neck demonstrates no acute process. No external wounds or facial pain. MRI pending. Stable orthostatics. Signs/symptoms concerning for vertigo. Possibly related to starting Doxycyline.    Pt signed out to oncoming provider while awaiting MRI. Plan for DC home if negative for acute stroke with ENT f/u.         Final Clinical Impression(s) / ED Diagnoses Final diagnoses:  Vertigo    Rx / DC Orders ED Discharge Orders          Ordered    meclizine (ANTIVERT) 25 MG tablet  3 times daily PRN        06/30/22 2014              Franne Forts, DO 07/05/22 1052

## 2022-06-30 NOTE — ED Provider Triage Note (Signed)
Emergency Medicine Provider Triage Evaluation Note  Keniel Ralston , a 66 y.o. male  was evaluated in triage.  Pt complains of dizziness/room spinning sensation since this AM. The patient reports that when he got out of bed this AM, he had rooms spinning sensation and had to lie back down. Now feels nauseous. Worse with movement. H/o HIV, reports numbers have been good. Recently started doxy for groin infection. Reports that he "doesn't know how that is going".  Review of Systems  Positive:  Negative:   Physical Exam  BP (!) 137/98   Pulse 75   Temp 97.8 F (36.6 C)   Resp 16   Ht 5\' 10"  (1.778 m)   Wt 75.8 kg   SpO2 97%   BMI 23.96 kg/m  Gen:   Awake, no distress   Resp:  Normal effort  MSK:   Moves extremities without difficulty  Other:  Cranial nerves are grossly intact. No pronator drift.   Medical Decision Making  Medically screening exam initiated at 9:54 AM.  Appropriate orders placed.  Ladarryl Jartavious Mckimmy was informed that the remainder of the evaluation will be completed by another provider, this initial triage assessment does not replace that evaluation, and the importance of remaining in the ED until their evaluation is complete.  Will order labs and CT imaging.    Hoy Morn, Achille Rich 06/30/22 [phone]

## 2022-06-30 NOTE — Discharge Instructions (Addendum)
Call for follow-up with ENT.  Dr. Ledell Peoples office may find someone else free to follow-up with.

## 2022-06-30 NOTE — ED Triage Notes (Addendum)
Pt arrives via EMS from home. Pt was started on doxycycline recently for a testicular infection. Reports he woke up with dizziness, nausea, and headache. Dizziness improves when he closes his eyes. Pt believes it could be the abx.

## 2022-06-30 NOTE — ED Notes (Signed)
Pt wanted to ambulate. RN escorted pt outside to sit and wait on ride.

## 2022-06-30 NOTE — ED Notes (Signed)
Patient transported to MRI 

## 2022-06-30 NOTE — ED Provider Notes (Signed)
  Physical Exam  BP (!) 135/101   Pulse 71   Temp (!) 97.4 F (36.3 C) (Oral)   Resp 17   Ht 5\' 10"  (1.778 m)   Wt 75.8 kg   SpO2 97%   BMI 23.96 kg/m   Physical Exam  Procedures  Procedures  ED Course / MDM    Medical Decision Making Amount and/or Complexity of Data Reviewed Radiology: ordered.  Risk Prescription drug management.   Received patient in signout.  Dizziness/vertigo.  MRI ordered and reassuring.  Did not show stroke.  Lab work reassuring.  Will discharge home with ENT follow-up.  Discussed with possible cause from doxycycline.  Patient states he will stay on that since he is still having some pain from his epididymitis.       , MD 06/30/22 2018

## 2022-07-15 DIAGNOSIS — Z111 Encounter for screening for respiratory tuberculosis: Principal | ICD-10-CM

## 2022-08-30 MED ORDER — EVOTAZ 300 MG-150 MG TABLET
ORAL_TABLET | Freq: Every day | ORAL | 2 refills | 0 days | Status: CP
Start: 2022-08-30 — End: ?

## 2022-09-08 DIAGNOSIS — F3289 Other specified depressive episodes: Principal | ICD-10-CM

## 2022-09-08 DIAGNOSIS — F419 Anxiety disorder, unspecified: Principal | ICD-10-CM

## 2022-09-08 DIAGNOSIS — F32A Depression, unspecified depression type: Principal | ICD-10-CM

## 2022-09-08 MED ORDER — BUPROPION HCL SR 150 MG TABLET,12 HR SUSTAINED-RELEASE
ORAL_TABLET | Freq: Two times a day (BID) | ORAL | 0 refills | 90 days | Status: CP
Start: 2022-09-08 — End: ?

## 2022-11-08 DIAGNOSIS — Z13220 Encounter for screening for lipoid disorders: Principal | ICD-10-CM

## 2022-11-08 DIAGNOSIS — Z113 Encounter for screening for infections with a predominantly sexual mode of transmission: Principal | ICD-10-CM

## 2022-11-08 DIAGNOSIS — Z111 Encounter for screening for respiratory tuberculosis: Principal | ICD-10-CM

## 2022-11-08 DIAGNOSIS — B20 Human immunodeficiency virus [HIV] disease: Principal | ICD-10-CM

## 2022-11-08 DIAGNOSIS — I10 Essential (primary) hypertension: Principal | ICD-10-CM

## 2022-11-08 DIAGNOSIS — Z79899 Other long term (current) drug therapy: Principal | ICD-10-CM

## 2022-11-08 DIAGNOSIS — Z5181 Encounter for therapeutic drug level monitoring: Principal | ICD-10-CM

## 2022-11-08 DIAGNOSIS — Z9189 Other specified personal risk factors, not elsewhere classified: Principal | ICD-10-CM

## 2022-11-11 ENCOUNTER — Emergency Department (HOSPITAL_COMMUNITY)
Admission: EM | Admit: 2022-11-11 | Discharge: 2022-11-11 | Disposition: A | Discharge status: Home / Self Care | Payer: Medicare HMO | Attending: Emergency Medicine | Admitting: Emergency Medicine

## 2022-11-11 ENCOUNTER — Emergency Department (HOSPITAL_COMMUNITY): Payer: Medicare HMO

## 2022-11-11 DIAGNOSIS — K5792 Diverticulitis of intestine, part unspecified, without perforation or abscess without bleeding: Secondary | ICD-10-CM

## 2022-11-11 DIAGNOSIS — I10 Essential (primary) hypertension: Secondary | ICD-10-CM | POA: Diagnosis not present

## 2022-11-11 DIAGNOSIS — K5730 Diverticulosis of large intestine without perforation or abscess without bleeding: Secondary | ICD-10-CM | POA: Insufficient documentation

## 2022-11-11 DIAGNOSIS — R1032 Left lower quadrant pain: Secondary | ICD-10-CM | POA: Diagnosis present

## 2022-11-11 DIAGNOSIS — Z79899 Other long term (current) drug therapy: Secondary | ICD-10-CM | POA: Diagnosis not present

## 2022-11-11 DIAGNOSIS — Z21 Asymptomatic human immunodeficiency virus [HIV] infection status: Secondary | ICD-10-CM | POA: Diagnosis not present

## 2022-11-11 LAB — CBC WITH DIFFERENTIAL/PLATELET
Abs Immature Granulocytes: 0.04 10*3/uL (ref 0.00–0.07)
Basophils Absolute: 0.1 10*3/uL (ref 0.0–0.1)
Basophils Relative: 1 %
Eosinophils Absolute: 0.1 10*3/uL (ref 0.0–0.5)
Eosinophils Relative: 1 %
HCT: 44.8 % (ref 39.0–52.0)
Hemoglobin: 15.4 g/dL (ref 13.0–17.0)
Immature Granulocytes: 1 %
Lymphocytes Relative: 27 %
Lymphs Abs: 2.3 10*3/uL (ref 0.7–4.0)
MCH: 31.3 pg (ref 26.0–34.0)
MCHC: 34.4 g/dL (ref 30.0–36.0)
MCV: 91.1 fL (ref 80.0–100.0)
Monocytes Absolute: 0.8 10*3/uL (ref 0.1–1.0)
Monocytes Relative: 9 %
Neutro Abs: 5.4 10*3/uL (ref 1.7–7.7)
Neutrophils Relative %: 61 %
Platelets: 224 10*3/uL (ref 150–400)
RBC: 4.92 MIL/uL (ref 4.22–5.81)
RDW: 11.9 % (ref 11.5–15.5)
WBC: 8.6 10*3/uL (ref 4.0–10.5)
nRBC: 0 % (ref 0.0–0.2)

## 2022-11-11 LAB — COMPREHENSIVE METABOLIC PANEL
ALT: 24 U/L (ref 0–44)
AST: 25 U/L (ref 15–41)
Albumin: 4.3 g/dL (ref 3.5–5.0)
Alkaline Phosphatase: 109 U/L (ref 38–126)
Anion gap: 9 (ref 5–15)
BUN: 17 mg/dL (ref 8–23)
CO2: 24 mmol/L (ref 22–32)
Calcium: 9.3 mg/dL (ref 8.9–10.3)
Chloride: 104 mmol/L (ref 98–111)
Creatinine, Ser: 1.02 mg/dL (ref 0.61–1.24)
GFR, Estimated: >60 mL/min (ref >60)
Glucose, Bld: 122 mg/dL — ABNORMAL HIGH (ref 70–99)
Potassium: 3.8 mmol/L (ref 3.5–5.1)
Sodium: 137 mmol/L (ref 135–145)
Total Bilirubin: 4.3 mg/dL — ABNORMAL HIGH (ref 0.3–1.2)
Total Protein: 7.6 g/dL (ref 6.5–8.1)

## 2022-11-11 LAB — LIPASE, BLOOD: Lipase: 29 U/L (ref 11–51)

## 2022-11-11 LAB — LACTIC ACID, PLASMA: Lactic Acid, Venous: 1.3 mmol/L (ref 0.5–1.9)

## 2022-11-11 MED ORDER — IOHEXOL 300 MG/ML  SOLN
100.0000 mL | Freq: Once | INTRAMUSCULAR | Status: AC | PRN
Start: 2022-11-11 — End: 2022-11-11
  Administered 2022-11-11: 100 mL via INTRAVENOUS

## 2022-11-11 NOTE — ED Triage Notes (Signed)
Patient here from Urgent Care reporting LLQ abd pain for several days. Possible diverticulitis.

## 2022-11-11 NOTE — ED Provider Notes (Signed)
[facility]-EMERGENCY DEPT Provider Note   CSN: [id number] Arrival date & time: 11/11/22  1357     History  Chief Complaint  Patient presents with   Abdominal Pain    Richard Velazquez is a 66 y.o. male.  The history is provided by the patient and medical records. No language interpreter was used.  Abdominal Pain Pain location:  LLQ Pain quality: aching   Pain radiates to:  L flank and LLQ Pain severity:  Moderate Onset quality:  Gradual Duration:  3 days Timing:  Constant Progression:  Waxing and waning Chronicity:  Recurrent Worsened by:  Movement, palpation and position changes Ineffective treatments:  None tried Associated symptoms: chills, diarrhea, flatus, hematuria and nausea   Associated symptoms: no chest pain, no constipation, no cough, no dysuria, no fatigue, no fever, no shortness of breath and no vomiting        Home Medications Prior to Admission medications   Medication Sig Start Date End Date Taking? Authorizing Provider  Ascorbic Acid (VITAMIN C) 100 MG tablet Take 500 mg by mouth 3 (three) times daily.     [provider]  atazanavir-cobicistat (EVOTAZ) 300-150 MG tablet Take 1 tablet by mouth daily with breakfast.  09/22/17   [provider]  buPROPion (WELLBUTRIN SR) 150 MG 12 hr tablet Take 150 mg by mouth 2 (two) times daily.    [provider]  cyanocobalamin 2000 MCG tablet Take 2,000 mcg by mouth daily.    [provider]  diphenoxylate-atropine (LOMOTIL) 2.5-0.025 MG tablet Take 1 tablet by mouth 4 (four) times daily as needed for diarrhea or loose stools.     [provider]  fish oil-omega-3 fatty acids 1000 MG capsule Take 1 g by mouth daily.     [provider]  ibuprofen (ADVIL,MOTRIN) 200 MG tablet Take 800 mg by mouth every 6 (six) hours as needed for headache or mild pain.    [provider]  lamivudine (EPIVIR) 300 MG tablet Take 300 mg by mouth  daily.    [provider]  LISINOPRIL PO Take 5 mg by mouth.    [provider]  LORazepam (ATIVAN) 1 MG tablet Take 1 mg by mouth daily.     [provider]  meclizine (ANTIVERT) 25 MG tablet Take 1 tablet (25 mg total) by mouth 3 (three) times daily as needed for dizziness. 06/30/22   Benjiman Core, MD  OVER THE COUNTER MEDICATION Take 1 capsule by mouth at bedtime. Primary Digestive Supplement    [provider]  OVER THE COUNTER MEDICATION Take 1 capsule by mouth at bedtime. There-Biotic Supplement    [provider]  Probiotic Product (PROBIOTIC-10 ULTIMATE PO) Take 1 capsule by mouth daily.    [provider]  silodosin (RAPAFLO) 8 MG CAPS capsule Take 8 mg by mouth at bedtime.     [provider]  Tenofovir Alafenamide Fumarate (VEMLIDY) 25 MG TABS Take 25 mg by mouth daily.     [provider]  VITAMIN D PO Take 1,000 Units by mouth daily.     [provider]      Allergies    Androgel [testosterone], Clindamycin/lincomycin, Levaquin [levofloxacin in d5w], Cymbalta [duloxetine hcl], Trazodone and nefazodone, and Sildenafil    Review of Systems   Review of Systems  Constitutional:  Positive for chills. Negative for fatigue and fever.  HENT:  Negative for congestion.   Respiratory:  Negative for cough, chest tightness, shortness of breath and  wheezing.   Cardiovascular:  Negative for chest pain and palpitations.  Gastrointestinal:  Positive for abdominal pain, diarrhea, flatus and nausea. Negative for constipation and vomiting.  Genitourinary:  Positive for flank pain and hematuria. Negative for decreased urine volume, dysuria, frequency, genital sores, penile pain, penile swelling, scrotal swelling and testicular pain.  Musculoskeletal:  Negative for back pain, neck pain and neck stiffness.  Skin:  Negative for rash and wound.  Neurological:  Negative for light-headedness and headaches.   Psychiatric/Behavioral:  Negative for agitation and confusion.   All other systems reviewed and are negative.   Physical Exam Updated Vital Signs BP (!) 155/96 (BP Location: Left Arm)   Pulse 88   Temp 98 F (36.7 C) (Oral)   Resp 18   SpO2 93%  Physical Exam Vitals and nursing note reviewed.  Constitutional:      General: He is not in acute distress.    Appearance: He is well-developed. He is not ill-appearing, toxic-appearing or diaphoretic.  HENT:     Head: Normocephalic and atraumatic.     Mouth/Throat:     Mouth: Mucous membranes are moist.  Eyes:     Conjunctiva/sclera: Conjunctivae normal.  Cardiovascular:     Rate and Rhythm: Normal rate and regular rhythm.     Heart sounds: No murmur heard. Pulmonary:     Effort: Pulmonary effort is normal. No respiratory distress.     Breath sounds: Normal breath sounds. No wheezing, rhonchi or rales.  Chest:     Chest wall: No tenderness.  Abdominal:     General: Abdomen is flat. There is no distension.     Palpations: Abdomen is soft.     Tenderness: There is abdominal tenderness in the left lower quadrant. There is no right CVA tenderness, left CVA tenderness, guarding or rebound.  Musculoskeletal:        General: No swelling.     Cervical back: Neck supple.  Skin:    General: Skin is warm and dry.     Capillary Refill: Capillary refill takes less than 2 seconds.     Coloration: Skin is not pale.     Findings: No rash.  Neurological:     General: No focal deficit present.     Mental Status: He is alert.  Psychiatric:        Mood and Affect: Mood normal.     ED Results / Procedures / Treatments   Labs (all labs ordered are listed, but only abnormal results are displayed) Labs Reviewed  COMPREHENSIVE METABOLIC PANEL - Abnormal; Notable for the following components:      Result Value   Glucose, Bld 122 (*)    Total Bilirubin 4.3 (*)    All other components within normal limits  URINE CULTURE  CBC WITH  DIFFERENTIAL/PLATELET  LIPASE, BLOOD  LACTIC ACID, PLASMA  URINALYSIS, ROUTINE W REFLEX MICROSCOPIC    EKG None  Radiology CT ABDOMEN PELVIS W CONTRAST  Result Date: 11/11/2022 CLINICAL DATA:  Several day history of left lower quadrant pain EXAM: CT ABDOMEN AND PELVIS WITH CONTRAST TECHNIQUE: Multidetector CT imaging of the abdomen and pelvis was performed using the standard protocol following bolus administration of intravenous contrast. RADIATION DOSE REDUCTION: This exam was performed according to the departmental dose-optimization program which includes automated exposure control, adjustment of the mA and/or kV according to patient size and/or use of iterative reconstruction technique. CONTRAST:  111mL OMNIPAQUE IOHEXOL 300 MG/ML  SOLN COMPARISON:  CT abdomen and pelvis dated 12/20/2019 FINDINGS: Lower  chest: bibasilar linear atelectasis/scarring. No pleural effusion or pneumothorax demonstrated. Partially imaged heart size is normal. Hepatobiliary: Unchanged 2.8 cm segment 2 lobulated cyst (2:21). No intra or extrahepatic biliary ductal dilation. Normal gallbladder. Pancreas: No focal lesions or main ductal dilation. Spleen: Normal in size without focal abnormality. Adrenals/Urinary Tract: No adrenal nodules. No suspicious renal mass, calculi or hydronephrosis. No focal bladder wall thickening. Stomach/Bowel: Normal appearance of the stomach. Duodenal diverticula. Acute diverticulitis in the mid descending colon. Patent sigmoid anastomosis. Normal appendix. Vascular/Lymphatic: Aortic atherosclerosis. No enlarged abdominal or pelvic lymph nodes. Reproductive: Enlarged prostate. Other: No free fluid, fluid collection, or free air. Musculoskeletal: No acute or abnormal lytic or blastic osseous lesions. Compression deformity of L4 is new from 12/12/2019. IMPRESSION: 1. Acute diverticulitis in the mid descending colon. No evidence of perforation or abscess. 2.  Aortic Atherosclerosis (ICD10-I70.0).  Electronically Signed   By: Darrin Nipper M.D.   On: 11/11/2022 18:18    Procedures Procedures    Medications Ordered in ED Medications  iohexol (OMNIPAQUE) 300 MG/ML solution 100 mL (100 mLs Intravenous Contrast Given 11/11/22 1805)    ED Course/ Medical Decision Making/ A&P                           Medical Decision Making Amount and/or Complexity of Data Reviewed Labs: ordered.    Richard Velazquez is a 66 y.o. male past medical history significant for HIV, hypertension, GERD, depression, anxiety, and previous sigmoid diverticulitis status post sigmoid colectomy in 2020 who presents with several days of left lower quadrant abdominal pain, chills, loose stools, and flatulence.  According to patient, this feels similar when he had diverticulitis in the past although he has not any trouble since his partial colectomy.  He reports the pain gets up to a 7 out of 10 in severity and waxes and wanes.  He reports is more of an aching pain that has brief episodes that get more severe.  He reports some intermittent nausea but no vomiting.  Reports no trauma but does exercise.  He denies any testicle or groin pain.  Denies any history of kidney stones but does report some hematuria he suspects.  Denies dysuria however.  Denies any chest pain, shortness of breath, or cough.  Denies any trauma.  Denies other complaints.  On exam, lungs clear and chest nontender.  Abdomen is slightly tender in the left lower quadrant and left flank area.  Patient does think that the left side of his abdomen is bulging slightly although I do not appreciate bruising or swelling.  Left CVA area was nontender.  Good pulses in extremities on my exam.  Patient otherwise resting.  Given patient's history and previous surgery I do feel need to get imaging to rule out acute diverticulitis, kidney stone, urinary tract infection, or other causes of intra-abdominal pathology.  Will get urinalysis and culture.  Will get  labs.  Anticipate reassessment after workup to determine decision.  He said he does not want any pain or nausea medicine at this time.  7:02 PM Labs returned overall reassuring.  Patient is now and wait for urinalysis.  CT scan shows acute uncomplicated diverticulitis in the descending colon with no perforation or abscess.  Patient report has been feeling better and he is only taken 1 dose of his antibiotics.  We offered pain and nausea medicine prescription but he does not want them.  He wants to continue his outpatient antibiotics with Augmentin  and follow-up with his PCP.  This was felt to be reasonable given improved symptoms and reassuring vital signs.  He had no other questions or concerns and was discharged in good condition to continue outpatient management of his diverticulitis.  He understands extremity return precautions and he develops any concerning urinary symptoms he knows to return.  Patient discharged in good condition.         Final Clinical Impression(s) / ED Diagnoses Final diagnoses:  Diverticulitis  LLQ abdominal pain    Rx / DC Orders ED Discharge Orders     None       Clinical Impression: 1. Diverticulitis   2. LLQ abdominal pain     Disposition: Discharge  Condition: Good  I have discussed the results, Dx and Tx plan with the pt(& family if present). He/she/they expressed understanding and agree(s) with the plan. Discharge instructions discussed at great length. Strict return precautions discussed and pt &/or family have verbalized understanding of the instructions. No further questions at time of discharge.    New Prescriptions   No medications on file    Follow Up: Lilian Coma., MD [address] [place] [address] [phone]     [facility]-EMERGENCY DEPT [id number] [place]       Babyboy Loya, Gwenyth Allegra, MD 11/11/22  [phone]

## 2022-11-11 NOTE — ED Provider Triage Note (Signed)
Emergency Medicine Provider Triage Evaluation Note  Richard Velazquez , a 66 y.o. male  was evaluated in triage.  Pt complains of left lower quadrant pain x several days. Evaluated at [facility] this evening and referred to the ED for CT r/o diverticulitis.  Prior history of sigmoid colectomy approximately 3 years ago, reports the pain is significant, noted to have rebound.  Last bowel movement was this morning without any blood in his stool.  No nausea or vomiting.  Review of Systems  Positive: Abdominal pain Negative: Fever, nausea, vomiting, constipation  Physical Exam  BP (!) 155/96 (BP Location: Left Arm)   Pulse 88   Temp 98 F (36.7 C) (Oral)   Resp 18   SpO2 93%  Gen:   Awake, no distress   Resp:  Normal effort  MSK:   Moves extremities without difficulty  Other:  Abdomen is distended, distant bowel sounds, rebound present  Medical Decision Making  Medically screening exam initiated at 3:13 PM.  Appropriate orders placed.  Richard Velazquez was informed that the remainder of the evaluation will be completed by another provider, this initial triage assessment does not replace that evaluation, and the importance of remaining in the ED until their evaluation is complete.     Richard Manges, PA-C 11/11/22 1521

## 2022-11-11 NOTE — Discharge Instructions (Signed)
Your history, exam, workup today confirmed acute diverticulitis as a cause of her symptoms.  There is no abscess or perforation or other complicating factors and your labs are reassuring.  We discussed getting urinalysis but for some reason it was not run initially and given our determination of diverticulitis as a cause of symptoms, we feel it is reasonable to have you discharged and follow-up with your PCP rather than wait for the urinalysis to be completed.  Please use the antibiotic you are started on and over-the-counter medications.  If any symptoms change or worsen acutely, please return to the nearest emergency room.

## 2022-11-24 DIAGNOSIS — B20 Human immunodeficiency virus [HIV] disease: Principal | ICD-10-CM

## 2022-11-24 MED ORDER — LAMIVUDINE 300 MG TABLET
ORAL_TABLET | Freq: Every day | ORAL | 2 refills | 30 days | Status: CP
Start: 2022-11-24 — End: ?

## 2022-11-24 MED ORDER — VEMLIDY 25 MG TABLET
ORAL_TABLET | Freq: Every day | ORAL | 2 refills | 30 days | Status: CP
Start: 2022-11-24 — End: ?

## 2022-11-24 MED ORDER — EVOTAZ 300 MG-150 MG TABLET
ORAL_TABLET | Freq: Every day | ORAL | 2 refills | 0 days | Status: CP
Start: 2022-11-24 — End: ?

## 2022-11-25 DIAGNOSIS — F419 Anxiety disorder, unspecified: Principal | ICD-10-CM

## 2022-11-25 DIAGNOSIS — F3289 Other specified depressive episodes: Principal | ICD-10-CM

## 2022-11-25 DIAGNOSIS — B20 Human immunodeficiency virus [HIV] disease: Principal | ICD-10-CM

## 2022-11-25 DIAGNOSIS — F32A Depression, unspecified depression type: Principal | ICD-10-CM

## 2022-11-26 DIAGNOSIS — B20 Human immunodeficiency virus [HIV] disease: Principal | ICD-10-CM

## 2022-11-26 MED ORDER — EVOTAZ 300 MG-150 MG TABLET
ORAL_TABLET | Freq: Every day | ORAL | 2 refills | 0 days | Status: CP
Start: 2022-11-26 — End: ?

## 2022-12-06 DIAGNOSIS — B20 Human immunodeficiency virus [HIV] disease: Principal | ICD-10-CM

## 2022-12-30 ENCOUNTER — Ambulatory Visit
Admit: 2022-12-30 | Discharge: 2022-12-31 | Payer: MEDICARE | Attending: Infectious Disease | Primary: Infectious Disease

## 2022-12-30 DIAGNOSIS — I10 Essential (primary) hypertension: Principal | ICD-10-CM

## 2022-12-30 DIAGNOSIS — B20 Human immunodeficiency virus [HIV] disease: Principal | ICD-10-CM

## 2022-12-30 DIAGNOSIS — R413 Other amnesia: Principal | ICD-10-CM

## 2022-12-30 DIAGNOSIS — F419 Anxiety disorder, unspecified: Principal | ICD-10-CM

## 2022-12-30 DIAGNOSIS — F3289 Other specified depressive episodes: Principal | ICD-10-CM

## 2022-12-30 MED ORDER — DOVATO 50 MG-300 MG TABLET
ORAL_TABLET | Freq: Every day | ORAL | 11 refills | 0 days | Status: CP
Start: 2022-12-30 — End: ?
  Filled 2023-01-03: qty 30, 30d supply, fill #0

## 2022-12-30 NOTE — Unmapped (Signed)
Assessment/Plan:         Bivalent booster in the fall  Discussed REPRIEVE study Results strongly recommend either pitavastatin or rosuvastatin at the lowest dose. - He is not willing to start  Received - XBB vaccine this fall  Declines shingles vaccine    Human immunodeficiency virus (HIV) disease (RAF-HCC)  tenofovir alafenamide 25 mg daily  Lamivudine (3TC) 300 mg daily  Atazanavir/cobicistat daily    Exercising more - weight up, sleeping better.   Riding motorcycle -     11/09/22 HIV RNA < 40 not detected, CD4 529 RPR and Quant Gold negative, safety labs essentailly normal (TB up due to atazanavir)  Elevated lipids  12/07/21 HIV RNA < 40 not detected, CD4 608  05/21/21 = . HIV RNA not detected. Last CD4 552 with slightly low ratio - 0.8 good immune recovery.     His episodes of profound fatigue still wax and wane.  Also issues with memory at times likely related to mild HIV associated neurocognitive disease (HAND).    We spent 30 minutes discussing antiretroviral and his likely move to [place] -   He has been suppressed for so long he is likely remain suppressed on Dolutegravir/ lamivudine (Dovato) despite very distant history on two drug therapy with ZDV/3TC and D4T/3TC.  This therapy would be very standard and easy to obtain anywhere in [place].     Past treatment history: He had vivid dreams thought secondary to [facility] - resolved when we when from tenofovir DF/emtricitabine to TDF plus 3TC and now tenofovir alefenamide 25 mg daily.  We have no HIV genotype data on him but he did have single and dual NRTI therapy in the 1990's.  We will replace replace atazanavir/cobsistat with dolutegravir -  One option would be to switch atazanavir.cobi to dolutegravir/lamivudine but keep the TAF.  We will go without it at this point and follow carefully.    We will do monthly - HIV RNA and video follow-up    The patient is on a medication (dolutegravir (Tivicay)) that blocks creatinine secretion.  The patient's creatinine level may increase by 0.1 to 0.4 mg/dL.  This is not a decline in GFR but will impact estimated GRF.  This increase occurs rapidly; within 2 weeks of starting the new medication.     Hyperlipidemia. ASCVD Risk - 18.4%  -as above recommended statin therapy. He would like to discuss with Dr. Derrell Lolling  This was discussed in detail in the past- has not wanted to take a statin - he understands risk   The 10-year ASCVD risk score (Arnett DK, et al., 2019) is: 18.4%    Values used to calculate the score:      Age: 47 years      Sex: Male      Is Non-Hispanic African American: No      Diabetic: No      Tobacco smoker: No      Systolic Blood Pressure: 134 mmHg      Is BP treated: Yes      HDL Cholesterol: 47 mg/dL      Total Cholesterol: 213 mg/dL    Note: For patients with SBP <90 or >200, Total Cholesterol <130 or >320, HDL <20 or >100 which are outside of the allowable range, the calculator will use these upper or lower values to calculate the patient???s risk score.      Memory issues  Stable -Continues to play piano. Has some difficulty concentration and learning new tasks.  Engagement with  motorcycle track riding and exercising with trainer  Short term worse than long-term  -improvement with increased exercise and better sleep.     Fatigue  Debilitiating at times. but remains motivated to play piano. Much more upbeat and active      Depression  Psychological condition stable - on Wellbutrin   Regular aerobic exercise - walking 1-2 times per day.  Much improved     Psychological condition  will be reassessed in 6 months.   Improved - no suicidal ideation  Has periods on decreased energy and anhedonia    Osteoarthritis of hand - reviewed stable  Responded to single injection of MCP joint on R 2015. Has not required further injections but still has intermittent pain. infrequent injections are OK on cobicistat if there is significant benefit.   Hand are good - arthritis improved. No recent injections  - playing piano with few limitations  5th digits  On each hand more of a problem MCP 1st, joints better.     Essential hypertension  Well controlled on telmisartan    Anxiety Occasionally uses lorazepam 0.5 mg for sleep     Diverticulitis   Surgery 01/23/19 with good success and rapid recovery. THough he had a recent recurrence  - resolved.     Shoulder/Neck pain   Now a radicular component on the right - worse when he plays piano.  He is using cervical traction and occasional ibuprofen. Improving. Working with Systems analyst - which has led to continued improvement.     Additional Comments:    Urinary frequency   - improve post recent prostate surgery    Lumbar stenosis   Back pain with L leg radiation - had MRI with significant stenosis.  Uses back brace.     Eczema  - recurrent on the chests clobetasol refilled  - lesions respond but recur. - very photo sensitive - improved with better sun protection    Weight loss  Much better appetite and improved nutrition - weight up approximately 7 pounds. He is near is normal healthy weigh of 162-165.    Papular lesion mid-back - Cherry angioma that is unchanged.  No other worrisome lesions     Decrease hearing in L ear and itching R ear (not changed)  Unchanged. Low frequency hum and higher frequency tinnitus  Unchanged. Recent audiology and ENT exam - no specific therapy offered.      Subjective:      Chief Complaint   HIV follow-up  Decreased attention span persists but improved    HPI  Return patient visit for Jason Donovan, a 67 y.o. male who returns for a HIV follow-up.    More exercise  sleeping better, weight up  Riding his motorcycle  Planning move to [place] in 2025     Needs to do cardio - has seen cardiology - stress test was negative -     Fatigue improved.     Cervical radiculopathy - improved.   Uses traction on back and neck  - would consider discussing with Dr. Derrell Lolling - he has some numbness in hands - may need imaging.    No symptoms of depression    Persistent decrease in hearing in L ear - chronic. High frequeny tinnitus that comes and goes. But not a complaint today.      Past Medical History:   Diagnosis Date    Depression     Diverticulitis     HIV disease (CMS-HCC)     Prostatitis  Allergies and Medications  Reviewed and updated today. HIV medications are listed in Assessment and Plan  Immunizations  Immunization History   Administered Date(s) Administered    COVID-19 VAC,BIVALENT(74YR UP),PFIZER 07/29/2021    COVID-19 VAC,BIVALENT,MODERNA(BLUE CAP) 07/29/2021    COVID-19 VACC,MRNA,(PFIZER)(PF) 02/14/2020, 03/10/2020, 04/04/2020, 07/10/2020    Hepatitis A (Adult) 06/21/2001, 12/27/2001    Hepatitis A Vaccine - Unspecified Formulation 06/21/2001, 12/27/2001    Hepatitis A Vaccine Pediatric / Adolescent 2 Dose IM 06/21/2001, 12/27/2001    Meningococcal Conjugate MCV4P 09/07/2019, 12/04/2019    PNEUMOCOCCAL POLYSACCHARIDE 23-VALENT 07/25/2003, 08/22/2008    PPD Test 03/23/2007    Pneumococcal Conjugate 13-Valent 10/12/2012    Pneumococcal Conjugate 20-valent 05/21/2021    Pneumococcal, Unspecified Formulation 07/25/2003, 08/22/2008    TD(TDVAX),ADSORBED,2LF(IM)(PF) 03/24/2017    TdaP 12/22/2006     Social History  General -  .  .  .  mood is better  He is excited about planned move to [place] - has more money due to work pension  Sexual History -  .  .  .  .  .  .  .  .  Marland KitchenHe has a single male sex partner with whom he has sex very infrequently none recently.  He describes low risk sex. Oral sex only  Substance Use -  .  .  .  .  .  .  .Non smoker, glass of wine per day.   Review of Systems  As per HPI. Remainder of systems reviewed,as above  No fever chills, cough SOB or CP. NO N/V/D, no abdominal.      Objective:    BP 134/83 (BP Site: L Arm, BP Position: Sitting, BP Cuff Size: Medium)  - Pulse 64  - Temp 36.5 ??C (97.7 ??F) (Oral)  - Ht 177.8 cm (5' 10)  - Wt 78.6 kg (173 lb 3.2 oz)  - BMI 24.85 kg/m??    Phone visit - no vital signs    Previous visit  Well appearing, conversant, more focused - blood pressure normal - weight up close to baseline  Poor dentition but no obvious infection. No oral lesions  Normal gait  No peripheral edema    Most recent labs include:   [facility]:   Absolute CD4 Count (/uL)   Date Value Status   10/05/2018 634 Final   09/22/2017 615 Final   07/08/2016 685 Final   05/08/2015 576 Final   09/19/2014 474 (L) Final   12/27/2013 527 Final   09/20/2013 559 Final   03/01/2013 399 (L) Final     CD4% (T Helper)   Date Value Status   10/05/2018 31 % (L) Final   09/22/2017 29 % (L) Final   07/08/2016 30 % (L) Final   05/08/2015 26 % (L) Final   09/19/2014 26 % (L) Final   12/27/2013 26 % (L) Final   09/20/2013 28 % (L) Final   03/01/2013 33 % (L) Final     HIV RNA Quant Result (no units)   Date Value Status   05/21/2021 Not Detected Final   10/05/2018 Not Detected Final   03/30/2018 Not Detected Final   09/22/2017 Not Detected Final   09/19/2014 Not Detected Final   05/09/2014 Not Detected Final   12/27/2013 Not Detected Final   09/20/2013 Not Detected Final     HIV RNA (copies/mL)   Date Value Status   11/09/2022 <40 Final   06/25/2022 <40 Final   12/07/2021 <40 Final   11/17/2020 <40 Final  Lab Results   Component Value Date    WBC 5.5 11/09/2022    Platelet 216 11/09/2022    HGB 15.6 11/09/2022    Creatinine 1.21 11/09/2022    Creatinine 1.20 06/25/2022    Creatinine 1.12 12/07/2021    Creatinine 0.90 05/21/2021    Creatinine 1.25 11/17/2020    AST 24 11/09/2022    ALT 27 11/09/2022    ALT 20 06/25/2022    ALT 18 12/07/2021    ALT 25 05/21/2021    ALT 17 11/17/2020    C. Trach Source THROAT 05/27/2011    Chlamydia trachomatis, NAA NEGATIVE 05/27/2011    GC Source THROAT 05/27/2011    Gonorrhoeae NAA NEGATIVE 05/27/2011    Cholesterol, Total 213 (H) 11/09/2022    Triglycerides 173 (H) 11/09/2022    Non-HDL Cholesterol 123 03/30/2018    HDL 47 11/09/2022    LDL Calculated 135 (H) 11/09/2022          Prevention, adherence and health education   .  .  .  .  . .  .Adherence discussed. Exercise is walking, no longer swimming.     COVID Prevention and Interventions:  We discussed the current COVID pandemic and strategies to avoid infection and what to do if the patient becomes symptomatic.  We discussed if the patient becomes ill with fever and respiratory illness -that they should be tested.  Many pharmacies offer testing or testing with home tests. Doing a test two days in a row if the first test is negative may improve the ability to pick up the infection.  We discussed the importance of being tested for SARS-CoV-2 if symptomatic and discussed antiviral treatment, Paxlovid would be optimal,  if symptomatic with COVID 19. The patient is very high risk.     Reasons to visit the emergency department include SOB, confusion, lightheadedness when standing.        I personally spent 66 minutes face-to-face and non-face-to-face in the care of this patient, which includes all pre, intra, and post visit time on the date of service.  All documented time was specific to the E/M visit and does not include any procedures that may have been performed.      Health maintenance  Reviewed immunization status.   refuses flu vaccine . Pneumococcal up to date   Reviewed TB testing history.  next visit. Negative 05/21/21  Reviewed cancer screening needs.  Managed by primary care provider.        Mammography Male patient  RPR deferred until future visit.Negative 05/21/21  Anal Pap needed but deferred until future visit. No recent testing.  Screening HCV Ab deferred until future visit. Negative 06/19/20  Lipid Screening.  Managed by primary care provider.   A1C 5.5 in September 2020  - Primary care provider.     Disposition  Return to clinic in 6 months or sooner if needed.

## 2022-12-31 NOTE — Unmapped (Signed)
[facility]   Patient Onboarding/Medication Counseling    [name] is a 67 y.o. male with HIV who I am counseling today on initiation of therapy.  I am speaking to the patient.    Was a Nurse, learning disability used for this call? No    Verified patient's date of birth / HIPAA.    Specialty medication(s) to be sent: Infectious Disease: Dovato      Non-specialty medications/supplies to be sent: n/a      Medications not needed at this time: n/a         Dovato (dolutegravir-lamivudine 50-300mg ) tablets    The patient declined counseling on medication administration, missed dose instructions, goals of therapy, side effects and monitoring parameters, warnings and precautions, drug/food interactions, and storage, handling precautions, and disposal because they were counseled in clinic. The information in the declined sections below are for informational purposes only and was not discussed with patient.       Medication & Administration     Dosage: Take one tablet by mouth once daily    Administration:   Take with or without food  2 hours before or 6 hours after cation-containing antacids or laxatives, sucralfate, oral supplements containing iron or calcium, or buffered medications. Alternatively, dolutegravir/lamivudine and supplements containing calcium or iron can be taken together with food.    Adherence/Missed dose instructions: take missed dose as soon as you remember. If it is close to the time of your next dose, skip the dose and resume with your next scheduled dose.    Goals of Therapy     The goal of therapy is to prevent HIV replication and keep HIV at a non-detectable level on lab tests    Side Effects & Monitoring Parameters   Headache.  Diarrhea.  Upset stomach.  Trouble sleeping.  Feeling tired or weak    The following side effects should be reported to the provider:  Signs of an allergic reaction, like rash; hives; itching; red, swollen, blistered, or peeling skin with or without fever; wheezing; tightness in the chest or throat; trouble breathing, swallowing, or talking; unusual hoarseness; or swelling of the mouth, face, lips, tongue, or throat.  Signs of liver problems like dark urine, feeling tired, not hungry, upset stomach or stomach pain, light-colored stools, throwing up, or yellow skin or eyes.  Signs of too much lactic acid in the blood (lactic acidosis) like fast breathing, fast heartbeat, a heartbeat that does not feel normal, very bad upset stomach or throwing up, feeling very sleepy, shortness of breath, feeling very tired or weak, very bad dizziness, feeling cold, or muscle pain or cramps.  Signs of a pancreas problem (pancreatitis) like very bad stomach pain, very bad back pain, or very bad upset stomach or throwing up.  Fever.  Muscle or joint pain.  Mouth sores.  Eye irritation.  Shortness of breath.  A burning, numbness, or tingling feeling that is not normal  Low mood (depression).  Changes in your immune system can happen when you start taking drugs to treat HIV. If you have an infection that you did not know you had, it may show up when you take this drug. Tell your doctor right away if you have any new signs after you start this drug, even after taking it for several months. This includes signs of infection like fever, sore throat, weakness, cough, or shortness of breath.    Contraindications, Warnings, & Precautions     Lactic acidosis  Immune reconstitution syndrome  Hypersensitivity reactions  Renal Impairment: not recommended in patients with CrCl <50 ml/min  Chronic hepatitis B: [US Boxed Warning]: Severe acute exacerbations of hepatitis B virus (HBV) have been reported in patients who are co-infected with HIV-1 and HBV and have discontinued lamivudine. Closely monitor hepatic function in these patients and, if appropriate, initiate anti-HBV treatment  Hepatic impairment: Not recommended for use in patients with severe hepatic impairment (Child-Pugh class C)  HBV resistance [US Boxed Warning]: All patients with HIV-1 should be tested for the presence of HBV prior to or when initiating dolutegravir/lamivudine. Emergence of lamivudine-resistant HBV variants in patients receiving lamivudine-containing antiretroviral regimens has been reported. If dolutegravir/lamivudine is used in patients co-infected with HIV-1 and HBV, additional treatment should be considered for appropriate treatment of chronic HBV; otherwise, consider an alternative regimen.   Concurrent use with dofetelide    Drug/Food Interactions     Medication list reviewed in Epic. The patient was instructed to inform the care team before taking any new medications or supplements. No drug interactions identified.       Storage, Handling Precautions, & Disposal     Store at room temperature in a dry place. Do not store in a bathroom.  Keep all drugs in a safe place. Keep all drugs out of the reach of children and pets.  Throw away unused or expired drugs. Do not flush down a toilet or pour down a drain unless you are told to do so. Check with your pharmacist if you have questions about the best way to throw out drugs. There may be drug take-back programs in your area      Current Medications (including OTC/herbals), Comorbidities and Allergies     Current Outpatient Medications   Medication Sig Dispense Refill    ascorbic acid, vitamin C, (VITAMIN C) 500 MG tablet Take 2 tablets (1,000 mg total) by mouth daily.      buPROPion (WELLBUTRIN SR) 150 MG 12 hr tablet Take 1 tablet (150 mg total) by mouth two (2) times a day. 180 tablet 0    clobetasoL (TEMOVATE) 0.05 % ointment APPLY TOPICALLY TO THE AFFECTED AREA TWICE DAILY      cyanocobalamin 2000 MCG tablet Take 1 tablet (2,000 mcg total) by mouth daily.      diphenoxylate-atropine (LOMOTIL) 2.5-0.025 mg per tablet Take 1 tablet by mouth daily as needed.      dolutegravir-lamiVUDine (DOVATO) 50-300 mg Tab Take 1 tablet by mouth daily. 30 tablet 11    escitalopram oxalate (LEXAPRO) 20 MG tablet Take 0.5 tablets (10 mg total) by mouth two (2) times a day.      ibuprofen (ADVIL,MOTRIN) 200 MG tablet Take 800 mg by mouth daily as needed.      LORazepam (ATIVAN) 1 MG tablet Take 0.5 tablets (0.5 mg total) by mouth nightly. 15 tablet 3    omega-3 fatty acids-fish oil 360-1,200 mg cap Take 3 tablets by mouth daily.      telmisartan (MICARDIS) 40 MG tablet       triamcinolone (KENALOG) 0.1 % cream Apply topically. As needed for sun allergy       No current facility-administered medications for this visit.       Allergies   Allergen Reactions    Clindamycin Hcl (Bulk) Other (See Comments)     Can't remember    Duloxetine Hcl Other (See Comments)     fatigue    Levaquin [Levofloxacin] Other (See Comments)     Severe tendonitis    Tamsulosin Other (  See Comments)     Hypotension    Trazodone (Bulk) Other (See Comments)     priapism    Viagra [Sildenafil] Other (See Comments)     Anxiety, palpitations, no effect of drug    Androgel [Testosterone] Anxiety and Palpitations     Felt like was going to pass out    Cephalexin Nausea Only     Other reaction(s): Abdominal Pain       Patient Active Problem List   Diagnosis    Human immunodeficiency virus (HIV) disease (CMS-HCC)    Carpal tunnel syndrome    Lumbar disc disease with radiculopathy    Depression    Diverticulitis    Fatigue    Memory loss    Osteoarthritis of hand    Essential hypertension    Anxiety       Reviewed and up to date in Epic.    Appropriateness of Therapy     Acute infections noted within Epic:  No active infections  Patient reported infection: None    Is medication and dose appropriate based on diagnosis and infection status? Yes    Prescription has been clinically reviewed: Yes      Baseline Quality of Life Assessment      How many days over the past month did your HIV  keep you from your normal activities? For example, brushing your teeth or getting up in the morning. 0    Financial Information     Medication Assistance provided: [facility] [employer] Assistance    Anticipated copay of $0.00 reviewed with patient. Verified delivery address.    Delivery Information     Scheduled delivery date: 01/04/23    Expected start date: 01/04/23    Medication will be delivered via UPS to the prescription address in [facility].  This shipment will not require a signature.      Explained the services we provide at [facility] and that each month we would call to set up refills.  Stressed importance of returning phone calls so that we could ensure they receive their medications in time each month.  Informed patient that we should be setting up refills 7-10 days prior to when they will run out of medication.  A pharmacist will reach out to perform a clinical assessment periodically.  Informed patient that a welcome packet, containing information about our pharmacy and other support services, a Notice of Privacy Practices, and a drug information handout will be sent.      The patient or caregiver noted above participated in the development of this care plan and knows that they can request review of or adjustments to the care plan at any time.      Patient or caregiver verbalized understanding of the above information as well as how to contact the pharmacy at [phone] option 4 with any questions/concerns.  The pharmacy is open Monday through Friday 8:30am-4:30pm.  A pharmacist is available 24/7 via pager to answer any clinical questions they may have.    Patient Specific Needs     Does the patient have any physical, cognitive, or cultural barriers? No    Does the patient have adequate living arrangements? (i.e. the ability to store and take their medication appropriately) Yes    Did you identify any home environmental safety or security hazards? No    Patient prefers to have medications discussed with  Patient     Is the patient or caregiver able to read and understand education materials at a  high school level or above? Yes    Patient's primary language is  English     Is the patient high risk? No    SOCIAL DETERMINANTS OF HEALTH     At the [facility], we have learned that life circumstances - like trouble affording food, housing, utilities, or transportation can affect the health of many of our patients.   That is why we wanted to ask: are you currently experiencing any life circumstances that are negatively impacting your health and/or quality of life? Patient declined to answer    Social Determinants of Health     Financial Resource Strain: Not on file   Internet Connectivity: Not on file   Food Insecurity: Not on file   Tobacco Use: Low Risk  (12/17/2021)    Patient History     Smoking Tobacco Use: Never     Smokeless Tobacco Use: Never     Passive Exposure: Not on file   Housing/Utilities: Not on file   Alcohol Use: Not on file   Transportation Needs: Not on file   Substance Use: Not on file   Health Literacy: Not on file   Physical Activity: Not on file   Interpersonal Safety: Not on file   Stress: Not on file   Intimate Partner Violence: Not on file   Depression: Not on file   Social Connections: Not on file       Would you be willing to receive help with any of the needs that you have identified today? Not applicable       Roderic Palau, PharmD  [facility] Specialty Pharmacist

## 2022-12-31 NOTE — Unmapped (Signed)
[facility] Specialty Medication Onboarding    Specialty Medication: DOVATO 50-300 mg Tab (dolutegravir-lamiVUDine)  Prior Authorization: Not Required   Financial Assistance: Yes - grant approved as secondary   Final Copay/Day Supply: $0 / 30    Insurance Restrictions: None     Notes to Pharmacist:     The triage team has completed the benefits investigation and has determined that the patient is able to fill this medication at [facility]. Please contact the patient to complete the onboarding or follow up with the prescribing physician as needed.

## 2023-01-24 NOTE — Unmapped (Signed)
[facility] Refill Coordination Note    Jason Donovan, [name]: Apr 18, 1956  Phone: [phone] (home)       All above HIPAA information was verified with patient.         01/24/2023    10:11 AM   Specialty Rx Medication Refill Questionnaire   Which Medications would you like refilled and shipped? Dovato 50/300   Please list all current allergies: Levaquin, Clindamycin, Androgel, Trazodone, Cymbalta, Cephalexin   Have you missed any doses in the last 30 days? No   Have you had any changes to your medication(s) since your last refill? No   How many days remaining of each medication do you have at home? 10   Have you experienced any side effects in the last 30 days? No   Please enter the full address (street address, city, state, zip code) where you would like your medication(s) to be delivered to. [address] [place] [address]   Please specify on which day you would like your medication(s) to arrive. Note: if you need your medication(s) within 3 days, please call the pharmacy to schedule your order at [phone]  01/28/2023   Has your insurance changed since your last refill? No   Would you like a pharmacist to call you to discuss your medication(s)? No   Do you require a signature for your package? (Note: if we are billing Medicare Part B or your order contains a controlled substance, we will require a signature) Yes         Completed refill call assessment today to schedule patient's medication shipment from the [facility] [phone]).  All relevant notes have been reviewed.       Confirmed patient received a Conservation officer, historic buildings and a Surveyor, mining with first shipment. The patient will receive a drug information handout for each medication shipped and additional FDA Medication Guides as required.         REFERRAL TO PHARMACIST     Referral to the pharmacist: Not needed      [facility]     Shipping address confirmed in Epic.     Delivery Scheduled: Yes, Expected medication delivery date: 01/28/2023.     Medication will be delivered via UPS to the prescription address in Epic WAM.    Jason Donovan   [facility] Specialty Technician

## 2023-01-27 MED FILL — DOVATO 50 MG-300 MG TABLET: ORAL | 30 days supply | Qty: 30 | Fill #1

## 2023-02-17 ENCOUNTER — Telehealth
Admit: 2023-02-17 | Discharge: 2023-02-18 | Payer: MEDICARE | Attending: Infectious Disease | Primary: Infectious Disease

## 2023-02-17 DIAGNOSIS — K5790 Diverticulosis of intestine, part unspecified, without perforation or abscess without bleeding: Principal | ICD-10-CM

## 2023-02-17 DIAGNOSIS — F325 Major depressive disorder, single episode, in full remission: Principal | ICD-10-CM

## 2023-02-17 DIAGNOSIS — Z9189 Other specified personal risk factors, not elsewhere classified: Principal | ICD-10-CM

## 2023-02-17 DIAGNOSIS — B20 Human immunodeficiency virus [HIV] disease: Principal | ICD-10-CM

## 2023-02-17 DIAGNOSIS — Z79899 Other long term (current) drug therapy: Principal | ICD-10-CM

## 2023-02-17 DIAGNOSIS — R5382 Chronic fatigue, unspecified: Principal | ICD-10-CM

## 2023-02-17 DIAGNOSIS — Z5181 Encounter for therapeutic drug level monitoring: Principal | ICD-10-CM

## 2023-02-17 DIAGNOSIS — F3289 Other specified depressive episodes: Principal | ICD-10-CM

## 2023-02-17 DIAGNOSIS — I10 Essential (primary) hypertension: Principal | ICD-10-CM

## 2023-02-17 MED ORDER — DOVATO 50 MG-300 MG TABLET
ORAL_TABLET | Freq: Every day | ORAL | 11 refills | 30 days | Status: CP
Start: 2023-02-17 — End: ?
  Filled 2023-03-03: qty 30, 30d supply, fill #0

## 2023-02-17 NOTE — Unmapped (Signed)
Assessment/Plan:         Profound fatigue - comes on suddenly - feeling similar to previous, in contrast to how he was feeling 8 weeks ago.    No fever, no diarrhea, some nausea,   No sure that any of these symptoms are related to the change in medication that occurred 7 weeks ago.    Not very motivated secondary to fatigue  Does not this is related to depression - though he does feel that the decreased energy impacts his mood.   Increasing activity can lead to subsequent exhaustion.         Bivalent booster in the fall  Discussed REPRIEVE study Results strongly recommend either pitavastatin or rosuvastatin at the lowest dose. - He is not willing to start  Received - XBB vaccine this fall  Declines shingles vaccine    Human immunodeficiency virus (HIV) disease (RAF-HCC)  tenofovir alafenamide 25 mg daily  Lamivudine (3TC) 300 mg daily  Atazanavir/cobicistat daily    Exercising more - weight up, sleeping better.   Riding motorcycle -     11/09/22 HIV RNA < 40 not detected, CD4 529 RPR and Quant Gold negative, safety labs essentailly normal (TB up due to atazanavir)  Elevated lipids  12/07/21 HIV RNA < 40 not detected, CD4 608  05/21/21 = . HIV RNA not detected. Last CD4 552 with slightly low ratio - 0.8 good immune recovery.     His episodes of profound fatigue still wax and wane.  Also issues with memory at times likely related to mild HIV associated neurocognitive disease (HAND).    We spent 30 minutes discussing antiretroviral and his likely move to [place] -   He has been suppressed for so long he is likely remain suppressed on Dolutegravir/ lamivudine (Dovato) despite very distant history on two drug therapy with ZDV/3TC and D4T/3TC.  This therapy would be very standard and easy to obtain anywhere in [place].     Past treatment history: He had vivid dreams thought secondary to [facility] - resolved when we when from tenofovir DF/emtricitabine to TDF plus 3TC and now tenofovir alefenamide 25 mg daily.  We have no HIV genotype data on him but he did have single and dual NRTI therapy in the 1990's.  We will replace replace atazanavir/cobsistat with dolutegravir -  One option would be to switch atazanavir.cobi to dolutegravir/lamivudine but keep the TAF.  We will go without it at this point and follow carefully.    We will do monthly - HIV RNA and video follow-up    The patient is on a medication (dolutegravir (Tivicay)) that blocks creatinine secretion.  The patient's creatinine level may increase by 0.1 to 0.4 mg/dL.  This is not a decline in GFR but will impact estimated GRF.  This increase occurs rapidly; within 2 weeks of starting the new medication.     Hyperlipidemia. ASCVD Risk - 18.4%  -as above recommended statin therapy. He would like to discuss with Dr. Derrell Lolling  This was discussed in detail in the past- has not wanted to take a statin - he understands risk   The 10-year ASCVD risk score (Arnett DK, et al., 2019) is: 18.4%    Values used to calculate the score:      Age: 21 years      Sex: Male      Is Non-Hispanic African American: No      Diabetic: No      Tobacco smoker: No      Systolic  Blood Pressure: 134 mmHg      Is BP treated: Yes      HDL Cholesterol: 47 mg/dL      Total Cholesterol: 213 mg/dL    Note: For patients with SBP <90 or >200, Total Cholesterol <130 or >320, HDL <20 or >100 which are outside of the allowable range, the calculator will use these upper or lower values to calculate the patient???s risk score.      Memory issues  Stable -Continues to play piano. Has some difficulty concentration and learning new tasks.  Engagement with motorcycle track riding and exercising with trainer  Short term worse than long-term  -improvement with increased exercise and better sleep.     Fatigue  Debilitiating at times. but remains motivated to play piano. Much more upbeat and active        Depression  Psychological condition stable - on Wellbutrin   Regular aerobic exercise - walking 1-2 times per day.  Much improved Psychological condition  will be reassessed in 6 months.   Improved - no suicidal ideation  Has periods on decreased energy and anhedonia    Osteoarthritis of hand - reviewed stable  Responded to single injection of MCP joint on R 2015. Has not required further injections but still has intermittent pain. infrequent injections are OK on cobicistat if there is significant benefit.   Hand are good - arthritis improved. No recent injections  - playing piano with few limitations  5th digits  On each hand more of a problem MCP 1st, joints better.     Essential hypertension  Well controlled on telmisartan    Anxiety Occasionally uses lorazepam 0.5 mg for sleep     Diverticulitis   Surgery 01/23/19 with good success and rapid recovery. THough he had a recent recurrence  - resolved.     Shoulder/Neck pain   Now a radicular component on the right - worse when he plays piano.  He is using cervical traction and occasional ibuprofen. Improving. Working with Systems analyst - which has led to continued improvement.     Additional Comments:    Urinary frequency   - improve post recent prostate surgery    Lumbar stenosis   Back pain with L leg radiation - had MRI with significant stenosis.  Uses back brace.     Eczema  - recurrent on the chests clobetasol refilled  - lesions respond but recur. - very photo sensitive - improved with better sun protection    Weight loss  Much better appetite and improved nutrition - weight up approximately 7 pounds. He is near is normal healthy weigh of 162-165.    Papular lesion mid-back - Cherry angioma that is unchanged.  No other worrisome lesions     Decrease hearing in L ear and itching R ear (not changed)  Unchanged. Low frequency hum and higher frequency tinnitus  Unchanged. Recent audiology and ENT exam - no specific therapy offered.      Subjective:      Chief Complaint   HIV follow-up  Decreased attention span persists but improved    HPI  Return patient visit for Jason Donovan, a 67 y.o. male who returns for a HIV follow-up.    More exercise  sleeping better, weight up  Riding his motorcycle  Planning move to [place] in 2025     Needs to do cardio - has seen cardiology - stress test was negative -     Fatigue improved.     Cervical  radiculopathy - improved.   Uses traction on back and neck  - would consider discussing with Dr. Derrell Lolling - he has some numbness in hands - may need imaging.    No symptoms of depression    Persistent decrease in hearing in L ear - chronic. High frequeny tinnitus that comes and goes. But not a complaint today.      Past Medical History:   Diagnosis Date    Depression     Diverticulitis     HIV disease (CMS-HCC)     Prostatitis        Allergies and Medications  Reviewed and updated today. HIV medications are listed in Assessment and Plan  Immunizations  Immunization History   Administered Date(s) Administered    COVID-19 VAC,BIVALENT,MODERNA(BLUE CAP) 07/29/2021    COVID-19 VACC,MRNA,(PFIZER)(PF) 02/14/2020, 03/10/2020, 04/04/2020, 07/10/2020    Hepatitis A (Adult) 06/21/2001, 12/27/2001    Hepatitis A Vaccine - Unspecified Formulation 06/21/2001, 12/27/2001    Hepatitis A Vaccine Pediatric / Adolescent 2 Dose IM 06/21/2001, 12/27/2001    Meningococcal Conjugate MCV4P 09/07/2019, 12/04/2019    PNEUMOCOCCAL POLYSACCHARIDE 23-VALENT 07/25/2003, 08/22/2008    PPD Test 03/23/2007    Pneumococcal Conjugate 13-Valent 10/12/2012    Pneumococcal Conjugate 20-valent 05/21/2021    Pneumococcal, Unspecified Formulation 07/25/2003, 08/22/2008    TD(TDVAX),ADSORBED,2LF(IM)(PF) 03/24/2017    TdaP 12/22/2006     Social History  General -  .  .  .  mood is better  He is excited about planned move to [place] - has more money due to work pension  Sexual History -  .  .  .  .  .  .  .  .  Marland KitchenHe has a single male sex partner with whom he has sex very infrequently none recently.  He describes low risk sex. Oral sex only  Substance Use -  .  .  .  .  .  .  .Non smoker, glass of wine per POLYSACCHARIDE 23-VALENT 07/25/2003, 08/22/2008    PPD Test 03/23/2007    Pneumococcal Conjugate 13-Valent 10/12/2012    Pneumococcal Conjugate 20-valent 05/21/2021    Pneumococcal, Unspecified Formulation 07/25/2003, 08/22/2008    TD(TDVAX),ADSORBED,2LF(IM)(PF) 03/24/2017    TdaP 12/22/2006     Social History  General -  .  .  .  mood is better  He is excited about planned move to [place] - has more money due to work pension  Sexual History -  .  .  .  .  .  .  .  .  Marland KitchenHe has a single male sex partner with whom he has sex very infrequently none recently.  He describes low risk sex. Oral sex only  Substance Use -  .  .  .  .  .  .  .Non smoker, glass of wine per day.   Review of Systems  As per HPI. Remainder of systems reviewed,as above  No fever chills, cough SOB or CP. NO N/V/D, no abdominal.      Objective:    There were no vitals taken for this visit.   Video visit  Mood much more depressed than last visit in person.      Previous visit  Well appearing, conversant, more focused - blood pressure normal - weight up close to baseline  Poor dentition but no obvious infection. No oral lesions  Normal gait  No peripheral edema    Most recent labs include:   [facility]:   Absolute CD4 Count (/uL)   Date  Value Status   10/05/2018 634 Final   09/22/2017 615 Final   07/08/2016 685 Final   05/08/2015 576 Final   09/19/2014 474 (L) Final   12/27/2013 527 Final   09/20/2013 559 Final   03/01/2013 399 (L) Final     CD4% (T Helper)   Date Value Status   10/05/2018 31 % (L) Final   09/22/2017 29 % (L) Final   07/08/2016 30 % (L) Final   05/08/2015 26 % (L) Final   09/19/2014 26 % (L) Final   12/27/2013 26 % (L) Final   09/20/2013 28 % (L) Final   03/01/2013 33 % (L) Final     HIV RNA Quant Result (no units)   Date Value Status   05/21/2021 Not Detected Final   10/05/2018 Not Detected Final   03/30/2018 Not Detected Final   09/22/2017 Not Detected Final   09/19/2014 Not Detected Final   05/09/2014 Not Detected Final   12/27/2013 NAA NEGATIVE 05/27/2011    Cholesterol, Total 213 (H) 11/09/2022    Triglycerides 173 (H) 11/09/2022    Non-HDL Cholesterol 123 03/30/2018    HDL 47 11/09/2022    LDL Calculated 135 (H) 11/09/2022          Prevention, adherence and health education   .  .  .  .  .  .  .Adherence discussed. Exercise is walking, no longer swimming.     COVID Prevention and Interventions:  We discussed the current COVID pandemic and strategies to avoid infection and what to do if the patient becomes symptomatic.  We discussed if the patient becomes ill with fever and respiratory illness -that they should be tested.  Many pharmacies offer testing or testing with home tests. Doing a test two days in a row if the first test is negative may improve the ability to pick up the infection.  We discussed the importance of being tested for SARS-CoV-2 if symptomatic and discussed antiviral treatment, Paxlovid would be optimal,  if symptomatic with COVID 19. The patient is very high risk.     Reasons to visit the emergency department include SOB, confusion, lightheadedness when standing.        I personally spent 66 minutes face-to-face and non-face-to-face in the care of this patient, which includes all pre, intra, and post visit time on the date of service.  All documented time was specific to the E/M visit and does not include any procedures that may have been performed.      Health maintenance  Reviewed immunization status.   refuses flu vaccine . Pneumococcal up to date   Reviewed TB testing history.  next visit. Negative 05/21/21  Reviewed cancer screening needs.  Managed by primary care provider.        Mammography Male patient  RPR deferred until future visit.Negative 05/21/21  Anal Pap needed but deferred until future visit. No recent testing.  Screening HCV Ab deferred until future visit. Negative 06/19/20  Lipid Screening.  Managed by primary care provider.   A1C 5.5 in September 2020  - Primary care provider.     Disposition  Return to service.  All documented time was specific to the E/M visit and does not include any procedures that may have been performed.      Health maintenance  Reviewed immunization status.   refuses flu vaccine . Pneumococcal up to date   Reviewed TB testing history.  next visit. Negative 05/21/21  Reviewed cancer screening needs.  Managed by primary care  provider.        Mammography Male patient  RPR deferred until future visit.Negative 05/21/21  Anal Pap needed but deferred until future visit. No recent testing.  Screening HCV Ab deferred until future visit. Negative 06/19/20  Lipid Screening.  Managed by primary care provider.   A1C 5.5 in September 2020  - Primary care provider.     Disposition  Return to clinic in 6 months or sooner if needed.

## 2023-02-17 NOTE — Unmapped (Signed)
You and I are Partners in Your care!  Your most important part of the partnership is for you to come to your appointments and take your medications.  My job is to make sure you are on the safest, most effective simplest treatment possible.    If you have trouble getting your medications - PLEASE let us know.    MyChart is the best way to communicate with [place]. Jonah and I try to check this every week day.  If you don't know how to use it please let Jonah or the Front Desk know    Please try to arrive 30 minutes BEFORE your scheduled appointment time!  This will give you time to fill out any front desk paperwork needed for your visit, and allow you to be seen as close to your scheduled appointment time as possible.    Keeping  updated for COVID-19 vaccine is important - if you have questions please ask.  There is treatment for COVID-19 and you are likely to be eligible. The best treatment for you may be a pill to take for 5 days -   Get tested if you have symptoms and let us know RIGHT AWAY if you are positive    Your Most Recent CD4 T-cell Counts and Virus Level (not detected or < 40 is where you want to be)  Here are a couple of things to keep in mind when looking at your numbers:  For most people, we're checking CD4 counts every other visit (once or twice a year).  It's normal for your CD4 count to be different from visit to visit. Dr. Sheral Flow should have discussed this with you before, but feel free to contact him with any questions you might have.    Absolute CD4 Count (/uL)   Date Value Status   10/05/2018 634 Final   09/19/2014 474 (L) Final     CD4% (T Helper)   Date Value Status   10/05/2018 31 % (L) Final   09/19/2014 26 % (L) Final     HIV RNA Quant Result (no units)   Date Value Status   05/21/2021 Not Detected Final   09/19/2014 Not Detected Final     HIV RNA (copies/mL)   Date Value Status   11/09/2022 <40 Final         About your Medications  Please try to bring your medication bottles with you to every visit.    For refills, please contact your pharmacy and ask them to electronically send or fax the request to the ID Clinic.      Urgent Care Clinic  Monday, Tuesday, and Thursday from 8:30 - 12 noon    Please call ahead to speak with the nursing staff if you think you need to be seen urgently!      Contacting us   During working hours [phone]      [phone]  (toll free)   After hours or weekends [phone] and ask for the ID doctor on-call    Fax number  [phone]    Mail    Jiles Prows, MD      [facility]      [address]      [place], [place]  [address]

## 2023-02-17 NOTE — Unmapped (Unsigned)
[facility] Checklist     Type of visit:  video    Are you located in [place]? yes    Reason for visit: follow up    Questions / Concerns that need to be addressed: no    General Consent to Treat (GCT) for epic video visits only: Verbal consent    HCDM reviewed and updated in Epic:    We are working to make sure all of our patients wishes are updated in Epic and part of that is documenting a Environmental health practitioner for each patient  A Health Care Decision Maker is someone you choose who can make health care decisions for you if you are not able - who would you most want to do this for you?  is already up to date. Yes        COVID-19 Vaccine Summary  Which COVID-19 Vaccine was administered  Pfizer  Type:  Dates Given:                   If no: Are you interested in scheduling? Wants vaccine - eligible now

## 2023-02-19 LAB — HIV RNA, QUANTITATIVE, PCR: HIV RNA: <20 {copies}/mL

## 2023-02-19 LAB — CREATININE
CREATININE: 1.17 mg/dL (ref 0.76–1.27)
EGFR: 69 mL/min/{1.73_m2}

## 2023-03-02 NOTE — Unmapped (Signed)
[facility] Refill Coordination Note    Jason Donovan, [place]: 1956/10/10  Phone: [phone] (home)       All above HIPAA information was verified with patient.         03/02/2023     9:43 AM   Specialty Rx Medication Refill Questionnaire   Which Medications would you like refilled and shipped? DOVATO 4 days   Please list all current allergies: Levaquin, Clindamycin, Androgel, Trazadone, Cymbalta, Cephalexin   Have you missed any doses in the last 30 days? No   Have you had any changes to your medication(s) since your last refill? No   How many days remaining of each medication do you have at home? 10   Have you experienced any side effects in the last 30 days? No   Please enter the full address (street address, city, state, zip code) where you would like your medication(s) to be delivered to. [address] [place] [address]   Please specify on which day you would like your medication(s) to arrive. Note: if you need your medication(s) within 3 days, please call the pharmacy to schedule your order at [phone]  03/04/2023   Has your insurance changed since your last refill? No   Would you like a pharmacist to call you to discuss your medication(s)? No   Do you require a signature for your package? (Note: if we are billing Medicare Part B or your order contains a controlled substance, we will require a signature) No         Completed refill call assessment today to schedule patient's medication shipment from the [facility] [phone]).  All relevant notes have been reviewed.       Confirmed patient received a Conservation officer, historic buildings and a Surveyor, mining with first shipment. The patient will receive a drug information handout for each medication shipped and additional FDA Medication Guides as required.         REFERRAL TO PHARMACIST     Referral to the pharmacist: Not needed      [facility]     Shipping address confirmed in Epic.     Delivery Scheduled: Yes, Expected medication delivery date: 4/12.     Medication will be delivered via UPS to the prescription address in Epic WAM.    Jason Donovan   [facility] Specialty Technician

## 2023-03-24 NOTE — Unmapped (Signed)
**Jason Jason** Jason Jason    Specialty Medication(s) to be Shipped:   Infectious Disease: Dovato    Other medication(s) to be shipped: No additional medications requested for fill at this time     Jason Jason, DOB: 06/15/1956  Phone: [phone] (home)       All above HIPAA information was verified with patient.     Was a Nurse, learning disability used for this call? No    Completed refill call assessment today to schedule patient's medication shipment from the [facility] [phone]).  All relevant notes have been reviewed.     Specialty medication(s) and dose(s) confirmed: Regimen is correct and unchanged.   Changes to medications: Nyle reports no changes at this time.  Changes to insurance: No  New side effects reported not previously addressed with a pharmacist or physician: None reported  Questions for the pharmacist: No    Confirmed patient received a Conservation officer, historic buildings and a Surveyor, mining with first shipment. The patient will receive a drug information handout for each medication shipped and additional FDA Medication Guides as required.       DISEASE/MEDICATION-SPECIFIC INFORMATION        N/A    SPECIALTY MEDICATION ADHERENCE     Medication Adherence    Patient reported X missed doses in the last month: 0  Specialty Medication: DOVATO 50-300 mg Tab (dolutegravir-lamiVUDine)  Patient is on additional specialty medications: No  Patient is on more than two specialty medications: No  Any gaps in refill history greater than 2 weeks in the last 3 months: no  Demonstrates understanding of importance of adherence: yes  Informant: patient  Confirmed plan for next specialty medication refill: delivery by pharmacy  Refills needed for supportive medications: not needed          Refill Coordination    Has the Patients' Contact Information Changed: No  Is the Shipping Address Different: No         Were doses missed due to medication being on hold? No    DOVATO 50-300   mg: 9 days of medicine on hand       REFERRAL TO PHARMACIST     Referral to the pharmacist: Not needed      [facility]     Shipping address confirmed in Epic.       Delivery Scheduled: Yes, Expected medication delivery date: 03/30/2023.     Medication will be delivered via UPS to the prescription address in Epic WAM.    [name]   [facility] Specialty Technician

## 2023-03-29 MED FILL — DOVATO 50 MG-300 MG TABLET: ORAL | 30 days supply | Qty: 30 | Fill #1

## 2023-04-26 NOTE — Unmapped (Signed)
[facility] Refill Coordination Note    Specialty Medication(s) to be Shipped:   Infectious Disease: Dovato    Other medication(s) to be shipped: No additional medications requested for fill at this time     Jason Donovan, DOB: 22-Jun-1956  Phone: [phone] (home)       All above HIPAA information was verified with patient.     Was a Nurse, learning disability used for this call? No    Completed refill call assessment today to schedule patient's medication shipment from the [facility] [phone]).  All relevant notes have been reviewed.     Specialty medication(s) and dose(s) confirmed: Regimen is correct and unchanged.   Changes to medications: Jason Donovan reports no changes at this time.  Changes to insurance: No  New side effects reported not previously addressed with a pharmacist or physician: None reported  Questions for the pharmacist: No    Confirmed patient received a Conservation officer, historic buildings and a Surveyor, mining with first shipment. The patient will receive a drug information handout for each medication shipped and additional FDA Medication Guides as required.       DISEASE/MEDICATION-SPECIFIC INFORMATION        N/A    SPECIALTY MEDICATION ADHERENCE     Medication Adherence    Patient reported X missed doses in the last month: 0  Specialty Medication: DOVATO 50-300 mg Tab (dolutegravir-lamiVUDine)  Patient is on additional specialty medications: No  Patient is on more than two specialty medications: No  Any gaps in refill history greater than 2 weeks in the last 3 months: no  Demonstrates understanding of importance of adherence: yes  Informant: patient  Confirmed plan for next specialty medication refill: delivery by pharmacy  Refills needed for supportive medications: not needed          Refill Coordination    Has the Patients' Contact Information Changed: No  Is the Shipping Address Different: No         Were doses missed due to medication being on hold? No    DOVATO 50-300  mg: 3 days of medicine on hand       REFERRAL TO PHARMACIST     Referral to the pharmacist: Not needed      [facility]     Shipping address confirmed in Epic.       Delivery Scheduled: Yes, Expected medication delivery date: 04/29/2023.     Medication will be delivered via UPS to the prescription address in Epic WAM.    [name]   [facility] Specialty Technician

## 2023-04-29 MED FILL — DOVATO 50 MG-300 MG TABLET: ORAL | 30 days supply | Qty: 30 | Fill #2

## 2023-05-23 NOTE — Unmapped (Signed)
[facility] Refill Coordination Note    Jason Donovan, : 1956-01-19  Phone: [phone] (home)       All above HIPAA information was verified with patient.         05/23/2023    10:39 AM   Specialty Rx Medication Refill Questionnaire   Which Medications would you like refilled and shipped? DOVATO   Please list all current allergies: Levaquin, Clindamycin, Androgel, Trazadone, Cymbalta, Cephalexin   Have you missed any doses in the last 30 days? No   Have you had any changes to your medication(s) since your last refill? No   How many days remaining of each medication do you have at home? 12   Have you experienced any side effects in the last 30 days? No   Please enter the full address (street address, city, state, zip code) where you would like your medication(s) to be delivered to. [address] [place], [place] [address]   Please specify on which day you would like your medication(s) to arrive. Note: if you need your medication(s) within 3 days, please call the pharmacy to schedule your order at [phone]  05/28/2023   Has your insurance changed since your last refill? No   Would you like a pharmacist to call you to discuss your medication(s)? No   Do you require a signature for your package? (Note: if we are billing Medicare Part B or your order contains a controlled substance, we will require a signature) No         Completed refill call assessment today to schedule patient's medication shipment from the [facility] [phone]).  All relevant notes have been reviewed.       Confirmed patient received a Conservation officer, historic buildings and a Surveyor, mining with first shipment. The patient will receive a drug information handout for each medication shipped and additional FDA Medication Guides as required.         REFERRAL TO PHARMACIST     Referral to the pharmacist: Not needed      [facility]     Shipping address confirmed in Epic.     Delivery Scheduled: Yes, Expected medication delivery date: 05/27/2023.     Medication will be delivered via UPS to the prescription address in Epic WAM.    [name]   [facility] Specialty Technician

## 2023-05-25 MED FILL — DOVATO 50 MG-300 MG TABLET: ORAL | 30 days supply | Qty: 30 | Fill #3

## 2023-06-20 NOTE — Unmapped (Signed)
[facility] Refill Coordination Note    Kentaro Tozer, [place]: 06/29/1956  Phone: [phone] (home)       All above HIPAA information was verified with patient.         06/17/2023     9:43 AM   Specialty Rx Medication Refill Questionnaire   Which Medications would you like refilled and shipped? Dovato   If medication refills are not needed at this time, please indicate the reason below. I have 19 days supply remaining   Please list all current allergies: levaquin, clindamycin, Androgel, trazadone, cephalexin   Have you missed any doses in the last 30 days? No   Have you had any changes to your medication(s) since your last refill? No   How many days remaining of each medication do you have at home? 19   Have you experienced any side effects in the last 30 days? No   Please enter the full address (street address, city, state, zip code) where you would like your medication(s) to be delivered to. [address], [place], [place] [address]   Please specify on which day you would like your medication(s) to arrive. Note: if you need your medication(s) within 3 days, please call the pharmacy to schedule your order at [phone]  06/23/2023   Has your insurance changed since your last refill? No   Would you like a pharmacist to call you to discuss your medication(s)? No   Do you require a signature for your package? (Note: if we are billing Medicare Part B or your order contains a controlled substance, we will require a signature) No         Completed refill call assessment today to schedule patient's medication shipment from the [facility] [phone]).  All relevant notes have been reviewed.       Confirmed patient received a Conservation officer, historic buildings and a Surveyor, mining with first shipment. The patient will receive a drug information handout for each medication shipped and additional FDA Medication Guides as required.         REFERRAL TO PHARMACIST     Referral to the pharmacist: Not needed      [facility]     Shipping address confirmed in Epic.     Delivery Scheduled: Yes, Expected medication delivery date: 06/23/2023.     Medication will be delivered via UPS to the prescription address in Epic WAM.    Dorisann Frames   [facility] Specialty Technician

## 2023-06-22 MED FILL — DOVATO 50 MG-300 MG TABLET: ORAL | 30 days supply | Qty: 30 | Fill #4

## 2023-07-14 NOTE — Unmapped (Signed)
[facility] Refill Coordination Note    Specialty Medication(s) to be Shipped:   Infectious Disease: Dovato    Other medication(s) to be shipped: No additional medications requested for fill at this time     Savas Sugarman, DOB: 07-May-1956  Phone: [phone] (home)       All above HIPAA information was verified with patient.     Was a Nurse, learning disability used for this call? No    Completed refill call assessment today to schedule patient's medication shipment from the [facility] [phone]).  All relevant notes have been reviewed.     Specialty medication(s) and dose(s) confirmed: Regimen is correct and unchanged.   Changes to medications: Damere reports no changes at this time.  Changes to insurance: No  New side effects reported not previously addressed with a pharmacist or physician: None reported  Questions for the pharmacist: No    Confirmed patient received a Conservation officer, historic buildings and a Surveyor, mining with first shipment. The patient will receive a drug information handout for each medication shipped and additional FDA Medication Guides as required.       DISEASE/MEDICATION-SPECIFIC INFORMATION        N/A    SPECIALTY MEDICATION ADHERENCE     Medication Adherence    Patient reported X missed doses in the last month: 0  Specialty Medication: DOVATO 50-300 mg Tab (dolutegravir-lamiVUDine)  Patient is on additional specialty medications: No              Were doses missed due to medication being on hold? No    Dovato 50-300 mg: 5 days of medicine on hand        REFERRAL TO PHARMACIST     Referral to the pharmacist: Not needed      [facility]     Shipping address confirmed in Epic.       Delivery Scheduled: Yes, Expected medication delivery date: 07/18/23.     Medication will be delivered via UPS to the prescription address in Epic WAM.    Quintella Reichert   [facility] Specialty Technician

## 2023-07-15 MED FILL — DOVATO 50 MG-300 MG TABLET: ORAL | 30 days supply | Qty: 30 | Fill #5

## 2023-08-16 NOTE — Unmapped (Signed)
[facility] Refill Coordination Note    Jason Donovan, Jason Donovan: Dec 19, 1955  Phone: [phone] (home)       All above HIPAA information was verified with patient.         08/16/2023     3:56 AM   Specialty Rx Medication Refill Questionnaire   Which Medications would you like refilled and shipped? Dovato   Please list all current allergies: Levaquin, Clindamycin, Androgel, Trazadone, Cymbalta, Cephalexin   Have you missed any doses in the last 30 days? No   Have you had any changes to your medication(s) since your last refill? Yes   Please list your medication(s) changes below. Added Silidosin   How many days remaining of each medication do you have at home? 11   Have you experienced any side effects in the last 30 days? No   Please enter the full address (street address, [place], state, zip code) where you would like your medication(s) to be delivered to. [address] [place], [place] [address]   Please specify on which day you would like your medication(s) to arrive. Note: if you need your medication(s) within 3 days, please call the pharmacy to schedule your order at [phone]  08/25/2023   Has your insurance changed since your last refill? No   Would you like a pharmacist to call you to discuss your medication(s)? No   Do you require a signature for your package? (Note: if we are billing Medicare Part B or your order contains a controlled substance, we will require a signature) No         Completed refill call assessment today to schedule patient's medication shipment from the [facility] [phone]).  All relevant notes have been reviewed.       Confirmed patient received a Conservation officer, historic buildings and a Surveyor, mining with first shipment. The patient will receive a drug information handout for each medication shipped and additional FDA Medication Guides as required.         REFERRAL TO PHARMACIST     Referral to the pharmacist: Not needed      [facility]     Shipping address confirmed in Epic.     Delivery Scheduled: Yes, Expected medication delivery date: 08/25/2023.     Medication will be delivered via UPS to the prescription address in Epic WAM.    [name]   [facility] Specialty Technician

## 2023-08-26 DIAGNOSIS — Z113 Encounter for screening for infections with a predominantly sexual mode of transmission: Principal | ICD-10-CM

## 2023-08-26 DIAGNOSIS — Z9189 Other specified personal risk factors, not elsewhere classified: Principal | ICD-10-CM

## 2023-08-26 DIAGNOSIS — Z79899 Other long term (current) drug therapy: Principal | ICD-10-CM

## 2023-08-26 DIAGNOSIS — Z131 Encounter for screening for diabetes mellitus: Principal | ICD-10-CM

## 2023-08-26 DIAGNOSIS — R799 Abnormal finding of blood chemistry, unspecified: Principal | ICD-10-CM

## 2023-08-26 DIAGNOSIS — Z5181 Encounter for therapeutic drug level monitoring: Principal | ICD-10-CM

## 2023-08-26 DIAGNOSIS — B20 Human immunodeficiency virus [HIV] disease: Principal | ICD-10-CM

## 2023-08-26 DIAGNOSIS — Z13220 Encounter for screening for lipoid disorders: Principal | ICD-10-CM

## 2023-08-26 MED FILL — DOVATO 50 MG-300 MG TABLET: ORAL | 30 days supply | Qty: 30 | Fill #6

## 2023-09-10 LAB — BASIC METABOLIC PANEL
BLOOD UREA NITROGEN: 21 mg/dL (ref 8–27)
BUN / CREAT RATIO: 15 (ref 10–24)
CALCIUM: 9.3 mg/dL (ref 8.6–10.2)
CHLORIDE: 101 mmol/L (ref 96–106)
CO2: 24 mmol/L (ref 20–29)
CREATININE: 1.41 mg/dL — ABNORMAL HIGH (ref 0.76–1.27)
EGFR: 55 mL/min/{1.73_m2} — ABNORMAL LOW
GLUCOSE: 97 mg/dL (ref 70–99)
POTASSIUM: 3.8 mmol/L (ref 3.5–5.2)
SODIUM: 139 mmol/L (ref 134–144)

## 2023-09-10 LAB — LYMPHOCYTE MARKERS LIMITED
% CD 3 POS. LYMPH.: 62.8 % (ref 57.5–86.2)
% NK (CD56/16): 24.5 % — ABNORMAL HIGH (ref 1.4–19.4)
AB NK (CD56): 417 /uL — ABNORMAL HIGH (ref 24–406)
ABSOLUTE CD 3: 1068 /uL (ref 622–2402)
ABSOLUTE CD8 CNT: 515 /uL (ref 109–897)
BANDED NEUTROPHILS ABSOLUTE COUNT: 0 10*3/uL (ref 0.0–0.1)
BASOPHILS ABSOLUTE COUNT: 0.1 10*3/uL (ref 0.0–0.2)
BASOPHILS RELATIVE PERCENT: 1 %
CD4 % HELPER T CELL: 30.4 % — ABNORMAL LOW (ref 30.8–58.5)
CD4 T CELL ABSOLUTE: 517 /uL (ref 359–1519)
CD4:CD8 RATIO: 1 (ref 0.92–3.72)
CD8 % SUPPRESSOR T CELL: 30.3 % (ref 12.0–35.5)
EOSINOPHILS ABSOLUTE COUNT: 0.2 10*3/uL (ref 0.0–0.4)
EOSINOPHILS RELATIVE PERCENT: 3 %
HEMATOCRIT: 48 % (ref 37.5–51.0)
HEMOGLOBIN: 15.6 g/dL (ref 13.0–17.7)
IMMATURE GRANULOCYTES: 1 %
LYMPHOCYTES ABSOLUTE COUNT: 1.7 10*3/uL (ref 0.7–3.1)
LYMPHOCYTES RELATIVE PERCENT: 29 %
MEAN CORPUSCULAR HEMOGLOBIN CONC: 32.5 g/dL (ref 31.5–35.7)
MEAN CORPUSCULAR HEMOGLOBIN: 30.4 pg (ref 26.6–33.0)
MEAN CORPUSCULAR VOLUME: 94 fL (ref 79–97)
MONOCYTES ABSOLUTE COUNT: 0.5 10*3/uL (ref 0.1–0.9)
MONOCYTES RELATIVE PERCENT: 8 %
NEUTROPHILS ABSOLUTE COUNT: 3.4 10*3/uL (ref 1.4–7.0)
NEUTROPHILS RELATIVE PERCENT: 58 %
PLATELET COUNT: 220 10*3/uL (ref 150–450)
RED BLOOD CELL COUNT: 5.13 x10E6/uL (ref 4.14–5.80)
RED CELL DISTRIBUTION WIDTH: 12.3 % (ref 11.6–15.4)
WHITE BLOOD CELL COUNT: 5.9 10*3/uL (ref 3.4–10.8)

## 2023-09-10 LAB — HIV RNA, QUANTITATIVE, PCR: HIV RNA: <20 {copies}/mL

## 2023-09-10 LAB — ALT: ALT (SGPT): 20 IU/L (ref 0–44)

## 2023-09-10 LAB — AST: AST (SGOT): 26 IU/L (ref 0–40)

## 2023-09-10 LAB — BILIRUBIN, TOTAL: BILIRUBIN TOTAL (MG/DL) IN SER/PLAS: 0.6 mg/dL (ref 0.0–1.2)

## 2023-09-10 LAB — HEMOGLOBIN A1C: HEMOGLOBIN A1C: 5.5 % (ref 4.8–5.6)

## 2023-09-10 LAB — SYPHILIS SCREEN: RPR: NONREACTIVE

## 2023-09-22 ENCOUNTER — Ambulatory Visit
Admit: 2023-09-22 | Discharge: 2023-09-23 | Payer: MEDICARE | Attending: Infectious Disease | Primary: Infectious Disease

## 2023-09-22 DIAGNOSIS — M19041 Primary osteoarthritis, right hand: Principal | ICD-10-CM

## 2023-09-22 DIAGNOSIS — R5382 Chronic fatigue, unspecified: Principal | ICD-10-CM

## 2023-09-22 DIAGNOSIS — M19042 Primary osteoarthritis, left hand: Principal | ICD-10-CM

## 2023-09-22 DIAGNOSIS — I10 Essential (primary) hypertension: Principal | ICD-10-CM

## 2023-09-22 DIAGNOSIS — F3289 Other specified depressive episodes: Principal | ICD-10-CM

## 2023-09-22 DIAGNOSIS — B20 Human immunodeficiency virus [HIV] disease: Principal | ICD-10-CM

## 2023-09-22 DIAGNOSIS — R413 Other amnesia: Principal | ICD-10-CM

## 2023-09-22 MED ORDER — CLOBETASOL 0.05 % TOPICAL OINTMENT
Freq: Two times a day (BID) | TOPICAL | 3 refills | 0 days | Status: CP
Start: 2023-09-22 — End: ?

## 2023-09-22 MED ORDER — TACROLIMUS 0.03 % TOPICAL OINTMENT
Freq: Two times a day (BID) | TOPICAL | 0 refills | 0 days | Status: CP
Start: 2023-09-22 — End: 2024-09-21

## 2023-09-22 MED ORDER — TRIAMCINOLONE ACETONIDE 0.1 % TOPICAL CREAM
Freq: Two times a day (BID) | TOPICAL | 3 refills | 0 days | Status: CP
Start: 2023-09-22 — End: ?

## 2023-09-22 NOTE — Unmapped (Signed)
Started assessment with patient options: in clinic      Patient declined RW services at this time.     Housing Status  Stable/Permanent    Insurance  Medicare Part C    Reason for Declining: Not interested at this time.        Dessa Phi,  Benefits & Eligibility Coordinator  Time of Intervention: 5 minutes

## 2023-09-22 NOTE — Unmapped (Signed)
Assessment/Plan:       Vaccination  -COVID - will get at his local pharmacy  Statin - discussed in detail - he does not want to start. 1:5 risk of cardiac event in 10 years    Need Visa for [place] Dovato may be first choice of in the [place] -     Human immunodeficiency virus (HIV) disease (RAF-HCC)  Dolutegravir 50 mg/3TC 300 mg FDC started approximately 6 weeks ago.    Labs excellent except creatinine - will repeat in 2-3 weeks  Increased energy    09/06/23 HIV RNA not detected CD4 517 CD4:CD8 ratio is 1.0, cr 1,41 other safety labs normal  02/14/23 HIV RNA not detected.   11/09/22 HIV RNA < 40 not detected, CD4 529 RPR and Quant Gold negative, safety labs essentailly normal (TB up due to atazanavir)  Elevated lipids  12/07/21 HIV RNA < 40 not detected, CD4 608  05/21/21 = . HIV RNA not detected. Last CD4 552 with slightly low ratio - 0.8 good immune recovery.     His episodes of profound fatigue still wax and wane.  Also issues with memory at times likely related to mild HIV associated neurocognitive disease (HAND).    He has remained well suppressed on Dolutegravir/ lamivudine (Dovato) despite very distant history on two drug therapy with ZDV/3TC and D4T/3TC.  This therapy would be very standard and easy to obtain anywhere in [place].     Past treatment history: He had vivid dreams thought secondary to [facility] - resolved when we when from tenofovir DF/emtricitabine to TDF plus 3TC and now tenofovir alefenamide 25 mg daily.  We have no HIV genotype data on him but he did have single and dual NRTI therapy in the 1990's.  We replaced atazanavir/cobsistat with dolutegravir -       The patient is on a medication (dolutegravir (Tivicay)) that blocks creatinine secretion.  The patient's creatinine level may increase by 0.1 to 0.4 mg/dL.  This is not a decline in GFR but will impact estimated GRF.  This increase occurs rapidly; within 2 weeks of starting the new medication.     Hyperlipidemia. ASCVD Risk - 18.4%  -as above recommended statin therapy. He has not been willing to start statin..  This was discussed in detail in the past- has not wanted to take a statin - he understands risk   The 10-year ASCVD risk score (Arnett DK, et al., 2019) is: 18.6%    Values used to calculate the score:      Age: 5 years      Sex: Male      Is Non-Hispanic African American: No      Diabetic: No      Tobacco smoker: No      Systolic Blood Pressure: 135 mmHg      Is BP treated: Yes      HDL Cholesterol: 47 mg/dL      Total Cholesterol: 213 mg/dL    Note: For patients with SBP <90 or >200, Total Cholesterol <130 or >320, HDL <20 or >100 which are outside of the allowable range, the calculator will use these upper or lower values to calculate the patient???s risk score.      Memory issues  Stable -Less time on piano. Has some difficulty concentration and learning new tasks.  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     Fatigue  Improved.  Upbeat and active     Depression  Psychological condition stable - on Wellbutrin and escitalopram  Regular aerobic exercise - walking 1-2 times per day.  Much improved     Psychological condition  will be reassessed in 6 months.   Improved - no suicidal ideation  Has periods on decreased energy and anhedonia    Osteoarthritis of hand - reviewed stable  Responded to single injection of MCP joint on R 2015. Has not required further injections but still has intermittent pain. infrequent injections are OK on cobicistat if there is significant benefit.   Hand are good - arthritis improved. No recent injections  - playing piano with few limitations  5th digits  On each hand more of a problem MCP 1st, joints better.     Essential hypertension  His LMD has stopped his antihypertensive    Anxiety Occasionally uses lorazepam 0.5 mg for sleep     Diverticulitis   Surgery 01/23/19 with good success and rapid recovery. THough he had a recent recurrence  - resolved.     Shoulder/Neck pain   Not a complaint today  Now a radicular component on the right - worse when he plays piano.  He is using cervical traction and occasional ibuprofen. Improving. Working with Systems analyst - which has led to continued improvement.     Additional Comments:    Urinary frequency   - improve post recent prostate surgery    Lumbar stenosis   Back pain with L leg radiation - had MRI with significant stenosis.  Uses back brace.     Eczema  - recurrent on the chests clobetasol refilled  - lesions respond but recur. - very photo sensitive - improved with better sun protection  We will try topical tacrolimus but he has agreed to see Dermatologist.     Weight loss (no longer active issue)  Much better appetite and improved nutrition - weight up approximately 7 pounds. Up to 169    Papular lesion mid-back - Cherry angioma that is unchanged.  No other worrisome lesions     Decrease hearing in L ear and itching R ear (not changed)  Unchanged. Low frequency hum and higher frequency tinnitus  Unchanged. Recent audiology and ENT exam - no specific therapy offered.      Subjective:      Chief Complaint   HIV follow-up  Decreased attention span persists but improved    HPI  Return patient visit for Jason Donovan, a 67 y.o. male who returns for a HIV follow-up.      Predominant complaint is his skin - itching lesion, scalp arms neck,    Improved fatigue, planning move to [place]  No fever, no diarrhea, some nausea,   Much more active.     Cervical radiculopathy - improved.   Uses traction on back and neck  - would consider discussing with Dr. Derrell Lolling - he has some numbness in hands - may need imaging.    No symptoms of depression    Persistent decrease in hearing in L ear - chronic. High frequeny tinnitus that comes and goes. But not a complaint today.      Past Medical History:   Diagnosis Date    Depression     Diverticulitis     HIV disease (CMS-HCC) Prostatitis        Allergies and Medications  Reviewed and updated today. HIV medications are listed in Assessment and Plan  Immunizations  Immunization History   Administered Date(s)  Administered    COVID-19 VAC,BIVALENT,MODERNA(BLUE CAP) 07/29/2021    COVID-19 VACC,MRNA,(PFIZER)(PF) 02/14/2020, 03/10/2020, 04/04/2020, 07/10/2020    Hepatitis A (Adult) 06/21/2001, 12/27/2001    Hepatitis A Vaccine - Unspecified Formulation 06/21/2001, 12/27/2001    Hepatitis A Vaccine Pediatric / Adolescent 2 Dose IM 06/21/2001, 12/27/2001    Meningococcal Conjugate MCV4P 09/07/2019, 12/04/2019    PNEUMOCOCCAL POLYSACCHARIDE 23-VALENT 07/25/2003, 08/22/2008    PPD Test 03/23/2007    Pneumococcal Conjugate 13-Valent 10/12/2012    Pneumococcal Conjugate 20-valent 05/21/2021    Pneumococcal, Unspecified Formulation 07/25/2003, 08/22/2008    TD(TDVAX),ADSORBED,2LF(IM)(PF) 03/24/2017    TdaP 12/22/2006     Social History  General -  .  .  .  mood is better  He is excited about planned move to [place] - has more money due to work pension  Sexual History -  .  .  .  .  .  .  .  .  Marland KitchenHe has a single male sex partner with whom he has sex very infrequently none recently.  He describes low risk sex. Oral sex only  Substance Use -  .  .  .  .  .  .  .Non smoker, glass of wine per day.   Review of Systems  As per HPI. Remainder of systems reviewed,as above  No fever chills, cough SOB or CP. NO N/V/D, no abdominal.      Objective:    BP 135/95 (BP Site: L Arm, BP Position: Sitting, BP Cuff Size: Medium)  - Pulse 76  - Temp 36.5 ??C (97.7 ??F) (Oral)  - Ht 177.8 cm (5' 10)  - Wt 76.8 kg (169 lb 6.4 oz)  - BMI 24.31 kg/m??      Well appearing, conversant, more focused - blood pressure up slightly off his antiphypertensive  Poor dentition but no obvious infection. No oral lesions  Multiple papular, erthematous lesions, excoriated on scalp. Some appear follicular. Very similar to previous.    Normal gait  No peripheral edema    Most recent labs include:   [facility]:   Absolute CD4 Count (/uL)   Date Value Status   10/05/2018 634 Final   09/22/2017 615 Final   07/08/2016 685 Final   05/08/2015 576 Final   09/19/2014 474 (L) Final   12/27/2013 527 Final   09/20/2013 559 Final   03/01/2013 399 (L) Final     CD4% (T Helper)   Date Value Status   10/05/2018 31 % (L) Final   09/22/2017 29 % (L) Final   07/08/2016 30 % (L) Final   05/08/2015 26 % (L) Final   09/19/2014 26 % (L) Final   12/27/2013 26 % (L) Final   09/20/2013 28 % (L) Final   03/01/2013 33 % (L) Final     HIV RNA Quant Result (no units)   Date Value Status   05/21/2021 Not Detected Final   10/05/2018 Not Detected Final   03/30/2018 Not Detected Final   09/22/2017 Not Detected Final   09/19/2014 Not Detected Final   05/09/2014 Not Detected Final   12/27/2013 Not Detected Final   09/20/2013 Not Detected Final     HIV RNA (copies/mL)   Date Value Status   09/06/2023 <20 Final   02/14/2023 <20 Final   11/09/2022 <40 Final   06/25/2022 <40 Final      Lab Results   Component Value Date    WBC 5.9 09/06/2023    Hemoglobin A1c 5.5 09/06/2023    Platelet 220  09/06/2023    HGB 15.6 09/06/2023    Creatinine 1.41 (H) 09/06/2023    Creatinine 1.17 02/14/2023    Creatinine 1.21 11/09/2022    Creatinine 1.20 06/25/2022    AST 26 09/06/2023    ALT 20 09/06/2023    ALT 27 11/09/2022    ALT 20 06/25/2022    ALT 18 12/07/2021    C. Trach Source THROAT 05/27/2011    Chlamydia trachomatis, NAA NEGATIVE 05/27/2011    GC Source THROAT 05/27/2011    Gonorrhoeae NAA NEGATIVE 05/27/2011    Cholesterol, Total 213 (H) 11/09/2022    Triglycerides 173 (H) 11/09/2022    Non-HDL Cholesterol 123 03/30/2018    HDL 47 11/09/2022    LDL Calculated 135 (H) 11/09/2022          Prevention, adherence and health education   .  .  .  .  .  .  .Adherence discussed. Exercise is walking, no longer swimming.     COVID Prevention and Interventions:  We discussed the current COVID pandemic and strategies to avoid infection and what to do if the patient becomes symptomatic.  We discussed if the patient becomes ill with fever and respiratory illness -that they should be tested.    We discussed the importance of being tested for SARS-CoV-2 if symptomatic and discussed antiviral treatment, Paxlovid would be optimal,  if symptomatic with COVID 19. The patient is very high risk.     Reasons to visit the emergency department include SOB, confusion, lightheadedness when standing.        I personally spent 38 minutes face-to-face and non-face-to-face in the care of this patient, which includes all pre, intra, and post visit time on the date of service.  All documented time was specific to the E/M visit and does not include any procedures that may have been performed.      Health maintenance  Reviewed immunization status.   refuses flu vaccine . Pneumococcal up to date Will get COvid at pharmacy  Reviewed TB testing history.  next visit. Negative 11/09/22  Reviewed cancer screening needs.  Managed by primary care provider.        Mammography Male patient  RPR deferred until future visit.Negative 09/06/23  Anal Pap needed but deferred until future visit. No recent testing.  Screening HCV Ab deferred until future visit. Negative 06/19/20  Lipid Screening.  Managed by primary care provider.   A1C 5.5 in Oct 2024  - Primary care provider.     Disposition  Return to clinic in 6 months or sooner if needed.

## 2023-09-22 NOTE — Unmapped (Signed)
You and I are Partners in Your care!  Your most important part of the partnership is for you to come to your appointments and take your medications.  My job is to make sure you are on the safest, most effective simplest treatment possible.    If you have trouble getting your medications - PLEASE let us know.    MyChart is the best way to communicate with [place]. Jonah and I try to check this every week day.  If you don't know how to use it please let Jonah or the Front Desk know    Please try to arrive 30 minutes BEFORE your scheduled appointment time!  This will give you time to fill out any front desk paperwork needed for your visit, and allow you to be seen as close to your scheduled appointment time as possible.    Keeping  updated for COVID-19 vaccine is important - if you have questions please ask.  There is treatment for COVID-19 and you are likely to be eligible. The best treatment for you may be a pill to take for 5 days -   Get tested if you have symptoms and let us know RIGHT AWAY if you are positive    Your Most Recent CD4 T-cell Counts and Virus Level (not detected or < 40 is where you want to be)  Here are a couple of things to keep in mind when looking at your numbers:  For most people, we're checking CD4 counts every other visit (once or twice a year).  It's normal for your CD4 count to be different from visit to visit. Dr. Sheral Flow should have discussed this with you before, but feel free to contact him with any questions you might have.    Absolute CD4 Count (/uL)   Date Value Status   10/05/2018 634 Final   09/19/2014 474 (L) Final     CD4% (T Helper)   Date Value Status   10/05/2018 31 % (L) Final   09/19/2014 26 % (L) Final     HIV RNA Quant Result (no units)   Date Value Status   05/21/2021 Not Detected Final   09/19/2014 Not Detected Final     HIV RNA (copies/mL)   Date Value Status   09/06/2023 <20 Final         About your Medications  Please try to bring your medication bottles with you to every visit.    For refills, please contact your pharmacy and ask them to electronically send or fax the request to the ID Clinic.      Urgent Care Clinic  Monday, Tuesday, and Thursday from 8:30 - 12 noon    Please call ahead to speak with the nursing staff if you think you need to be seen urgently!      Contacting us   During working hours [phone]      [phone]  (toll free)   After hours or weekends [phone] and ask for the ID doctor on-call    Fax number  [phone]    Mail    Jiles Prows, MD      [facility]      [address]      [place], [place]  [address]

## 2023-09-29 NOTE — Unmapped (Signed)
[facility] Specialty and Home Delivery Pharmacy Refill Coordination Note    Specialty Medication(s) to be Shipped:   Infectious Disease: Dovato    Other medication(s) to be shipped: No additional medications requested for fill at this time     Jason Donovan, DOB: 02-Nov-1956  Phone: [phone] (home)       All above HIPAA information was verified with patient.     Was a Nurse, learning disability used for this call? No    Completed refill call assessment today to schedule patient's medication shipment from the [facility] and Home Delivery Pharmacy  [phone]).  All relevant notes have been reviewed.     Specialty medication(s) and dose(s) confirmed: Regimen is correct and unchanged.   Changes to medications: [name] reports no changes at this time.  Changes to insurance: No  New side effects reported not previously addressed with a pharmacist or physician: None reported  Questions for the pharmacist: No    Confirmed patient received a Conservation officer, historic buildings and a Surveyor, mining with first shipment. The patient will receive a drug information handout for each medication shipped and additional FDA Medication Guides as required.       DISEASE/MEDICATION-SPECIFIC INFORMATION        N/A    SPECIALTY MEDICATION ADHERENCE     Medication Adherence    Patient reported X missed doses in the last month: 0  Specialty Medication: DOVATO 50-300 mg Tab (dolutegravir-lamiVUDine)  Patient is on additional specialty medications: No  Patient is on more than two specialty medications: No  Any gaps in refill history greater than 2 weeks in the last 3 months: no  Demonstrates understanding of importance of adherence: yes  Informant: patient  Confirmed plan for next specialty medication refill: delivery by pharmacy  Refills needed for supportive medications: not needed          Refill Coordination    Has the Patients' Contact Information Changed: No  Is the Shipping Address Different: No         Were doses missed due to medication being on hold? No    DOVATO 50-300   mg: 12 days of medicine on hand       REFERRAL TO PHARMACIST     Referral to the pharmacist: Not needed      [facility]     Shipping address confirmed in Epic.       Delivery Scheduled: Yes, Expected medication delivery date: 10/06/23.     Medication will be delivered via UPS to the prescription address in Epic WAM.    Jason Donovan   [facility] Specialty and Home Delivery Pharmacy  Specialty Technician

## 2023-10-05 MED FILL — DOVATO 50 MG-300 MG TABLET: ORAL | 30 days supply | Qty: 30 | Fill #7

## 2023-11-04 NOTE — Unmapped (Signed)
[facility] Specialty and Home Delivery Pharmacy Refill Coordination Note    Jason Donovan, [place]: June 18, 1956  Phone: [phone] (home)       All above HIPAA information was verified with patient.         11/03/2023    10:41 AM   Specialty Rx Medication Refill Questionnaire   Which Medications would you like refilled and shipped? Dovato 1 week remaining   Please list all current allergies: Levaquin, Clindamyacin,Androgel, Trazadone, Cymbalta, Cephlexin   Have you missed any doses in the last 30 days? No   Have you had any changes to your medication(s) since your last refill? No   How many days remaining of each medication do you have at home? Not applicable. I receive all other meds FOC from local pharmacy   Have you experienced any side effects in the last 30 days? No   Please enter the full address (street address, city, state, zip code) where you would like your medication(s) to be delivered to. [address], [place], [place] [address]   Please specify on which day you would like your medication(s) to arrive. Note: if you need your medication(s) within 3 days, please call the pharmacy to schedule your order at [phone]  11/07/2023   Has your insurance changed since your last refill? No   Would you like a pharmacist to call you to discuss your medication(s)? No   Do you require a signature for your package? (Note: if we are billing Medicare Part B or your order contains a controlled substance, we will require a signature) No         Completed refill call assessment today to schedule patient's medication shipment from the [facility] Specialty and Home Delivery Pharmacy [phone]).  All relevant notes have been reviewed.       Confirmed patient received a Conservation officer, historic buildings and a Surveyor, mining with first shipment. The patient will receive a drug information handout for each medication shipped and additional FDA Medication Guides as required.         REFERRAL TO PHARMACIST     Referral to the pharmacist: Not needed      [facility]     Shipping address confirmed in Epic.     Delivery Scheduled: Yes, Expected medication delivery date: 11/08/23.     Medication will be delivered via UPS to the prescription address in Epic WAM.    [name]   [facility] Specialty and Home Delivery Pharmacy Specialty Technician

## 2023-11-07 MED FILL — DOVATO 50 MG-300 MG TABLET: ORAL | 30 days supply | Qty: 30 | Fill #8

## 2023-12-01 DIAGNOSIS — B20 Human immunodeficiency virus [HIV] disease: Principal | ICD-10-CM

## 2023-12-02 NOTE — Unmapped (Signed)
[facility] Specialty and Home Delivery Pharmacy Clinical Assessment & Refill Coordination Note    Jason Donovan, DOB: 1956/04/12  Phone: [phone] (home)     All above HIPAA information was verified with patient.     Was a Nurse, learning disability used for this call? No    Specialty Medication(s):   Infectious Disease: Dovato     Current Outpatient Medications   Medication Sig Dispense Refill    ascorbic acid, vitamin C, (VITAMIN C) 500 MG tablet Take 2 tablets (1,000 mg total) by mouth daily.      buPROPion (WELLBUTRIN SR) 150 MG 12 hr tablet Take 1 tablet (150 mg total) by mouth two (2) times a day. 180 tablet 0    clobetasol (TEMOVATE) 0.05 % ointment Apply topically two (2) times a day. 15 g 3    cyanocobalamin 2000 MCG tablet Take 1 tablet (2,000 mcg total) by mouth daily.      diphenoxylate-atropine (LOMOTIL) 2.5-0.025 mg per tablet Take 1 tablet by mouth daily as needed.      dolutegravir-lamiVUDine (DOVATO) 50-300 mg Tab Take 1 tablet by mouth daily. 30 tablet 11    escitalopram oxalate (LEXAPRO) 20 MG tablet Take 0.5 tablets (10 mg total) by mouth two (2) times a day.      ibuprofen (ADVIL,MOTRIN) 200 MG tablet Take 4 tablets (800 mg total) by mouth daily as needed.      LORazepam (ATIVAN) 1 MG tablet Take 0.5 tablets (0.5 mg total) by mouth nightly. 15 tablet 3    omega-3 fatty acids-fish oil 360-1,200 mg cap Take 3 tablets by mouth daily.      silodosin (RAPAFLO) 8 mg cap silodosin      tacrolimus (PROTOPIC) 0.03 % ointment Apply topically two (2) times a day. 100 g 0    telmisartan (MICARDIS) 40 MG tablet       triamcinolone (KENALOG) 0.1 % cream Apply topically two (2) times a day. As needed for sun allergy 453.6 g 3     No current facility-administered medications for this visit.        Changes to medications: [name] reports no changes at this time.    Allergies   Allergen Reactions    Clindamycin Hcl (Bulk) Other (See Comments)     Can't remember    Duloxetine Other (See Comments)    Duloxetine Hcl Other (See Comments)     fatigue    Levaquin [Levofloxacin] Other (See Comments)     Severe tendonitis    Tamsulosin Other (See Comments)     Hypotension    Trazodone (Bulk) Other (See Comments)     priapism    Viagra [Sildenafil] Other (See Comments)     Anxiety, palpitations, no effect of drug    Androgel [Testosterone] Anxiety and Palpitations     Felt like was going to pass out    Cephalexin Nausea Only     Other reaction(s): Abdominal Pain       Changes to allergies: No    SPECIALTY MEDICATION ADHERENCE     Dovato 50-300 mg mg: 11 days of medicine on hand       Medication Adherence    Patient reported X missed doses in the last month: 0  Specialty Medication: Dovato 50-300mg  QD  Patient is on additional specialty medications: No  Informant: patient          Specialty medication(s) dose(s) confirmed: Regimen is correct and unchanged.     Are there any concerns with adherence? No  Adherence counseling provided? Not needed    CLINICAL MANAGEMENT AND INTERVENTION      Clinical Benefit Assessment:    Do you feel the medicine is effective or helping your condition? Yes    Clinical Benefit counseling provided? Not needed    Adverse Effects Assessment:    Are you experiencing any side effects? No    Are you experiencing difficulty administering your medicine? No    Quality of Life Assessment:    Quality of Life    Rheumatology  Oncology  Dermatology  Cystic Fibrosis          How many days over the past month did your HIV  keep you from your normal activities? For example, brushing your teeth or getting up in the morning. Patient declined to answer    Have you discussed this with your provider? Not needed    Acute Infection Status:    Acute infections noted within Epic:  No active infections  Patient reported infection: None    Therapy Appropriateness:    Is therapy appropriate based on current medication list, adverse reactions, adherence, clinical benefit and progress toward achieving therapeutic goals? Yes, therapy is appropriate and should be continued       HIV ASSOCIATED LABS:     Lab Results   Component Value Date/Time    HIVRS Not Detected 05/21/2021 11:59 AM    HIVRS Not Detected 10/05/2018 12:11 PM    HIVRS Not Detected 03/30/2018 10:46 AM    HIVRS Not Detected 09/19/2014 11:47 AM    HIVRS Not Detected 05/09/2014 02:20 PM    HIVRS Not Detected 12/27/2013 01:03 PM    HIVCP <20 09/06/2023 08:31 AM    HIVCP <20 02/14/2023 10:38 AM    HIVCP <40 11/09/2022 08:20 AM    ACD4 634 10/05/2018 12:11 PM    ACD4 615 09/22/2017 11:15 AM    ACD4 685 07/08/2016 12:07 PM    ACD4 474 (L) 09/19/2014 11:47 AM    ACD4 527 12/27/2013 01:03 PM    ACD4 559 09/20/2013 01:11 PM         DISEASE/MEDICATION-SPECIFIC INFORMATION      N/A    HIV: Not Applicable    PATIENT SPECIFIC NEEDS     Does the patient have any physical, cognitive, or cultural barriers? No    Is the patient high risk? No    Did the patient require a clinical intervention? No    Does the patient require physician intervention or other additional services (i.e., nutrition, smoking cessation, social work)? No    SOCIAL DETERMINANTS OF HEALTH     At the [facility], we have learned that life circumstances - like trouble affording food, housing, utilities, or transportation can affect the health of many of our patients.   That is why we wanted to ask: are you currently experiencing any life circumstances that are negatively impacting your health and/or quality of life? Patient declined to answer    Social Drivers of Health     Food Insecurity: No Food Insecurity (01/03/2023)    Received from [facility], [facility]    Hunger Vital Sign     Worried About Running Out of Food in the Last Year: Never true     Ran Out of Food in the Last Year: Never true   Internet Connectivity: Not on file   Housing/Utilities: Not on file   Tobacco Use: Low Risk  (09/22/2023)    Patient History     Smoking Tobacco  Use: Never     Smokeless Tobacco Use: Never     Passive Exposure: Not on file Recent Concern: Tobacco Use - Medium Risk (06/27/2023)    Received from [facility]    Patient History     Smoking Tobacco Use: Former     Smokeless Tobacco Use: Never     Passive Exposure: Never   Transportation Needs: No Transportation Needs (01/03/2023)    Received from [facility], [facility]    PRAPARE - Transportation     Lack of Transportation (Medical): No     Lack of Transportation (Non-Medical): No   Alcohol Use: Not At Risk (07/08/2021)    Received from [facility], [facility]    AUDIT-C     Frequency of Alcohol Consumption: 4 or more times a week     Average Number of Drinks: 1 or 2     Frequency of Binge Drinking: Never   Interpersonal Safety: Not on file   Physical Activity: Sufficiently Active (12/07/2022)    Received from [facility], [facility]    Exercise Vital Sign     Days of Exercise per Week: 4 days     Minutes of Exercise per Session: 40 min   Intimate Partner Violence: Not At Risk (12/07/2022)    Received from [facility], [facility]    HITS     Over the last 12 months how often did your partner physically hurt you?: 1     Over the last 12 months how often did your partner insult you or talk down to you?: 1     Over the last 12 months how often did your partner threaten you with physical harm?: 1     Over the last 12 months how often did your partner scream or curse at you?: 1   Stress: No Stress Concern Present (12/07/2022)    Received from [facility], [facility] - Occupational Stress Questionnaire     Feeling of Stress : Only a little   Substance Use: Not on file (09/29/2023)   Social Connections: Unknown (03/25/2022)    Received from [facility], [facility]    Social Network     Social Network: Not on file   Financial Resource Strain: Low Risk  (01/03/2023)    Received from [facility], [facility]    Overall Financial Resource Strain (CARDIA)     Difficulty of Paying Living Expenses: Not hard at all   Depression: At risk (06/27/2023)    Received from [facility]    Depression     PHQ Total Score: 7   Health Literacy: Not on file       Would you be willing to receive help with any of the needs that you have identified today? Not applicable       SHIPPING     Specialty Medication(s) to be Shipped:   Infectious Disease: Dovato    Other medication(s) to be shipped: No additional medications requested for fill at this time     Changes to insurance: No    Delivery Scheduled: Yes, Expected medication delivery date: 12/07/23.     Medication will be delivered via UPS to the confirmed prescription address in [facility].    The patient will receive a drug information handout for each medication shipped and additional FDA Medication Guides as required.  Verified that patient has previously received a Conservation officer, historic buildings and a Surveyor, mining.  The patient or caregiver noted above participated in the development of this care plan and knows that they can request review of or adjustments to the care plan at any time.      All of the patient's questions and concerns have been addressed.    Darryl Nestle, PharmD   [facility] Specialty and Home Delivery Pharmacy Specialty Pharmacist

## 2023-12-06 MED FILL — DOVATO 50 MG-300 MG TABLET: ORAL | 30 days supply | Qty: 30 | Fill #9

## 2023-12-16 NOTE — Unmapped (Signed)
error

## 2024-01-02 DIAGNOSIS — B20 Human immunodeficiency virus [HIV] disease: Principal | ICD-10-CM

## 2024-01-10 NOTE — Unmapped (Signed)
 [facility] Specialty and Home Delivery Pharmacy Refill Coordination Note    Specialty Medication(s) to be Shipped:   Infectious Disease: Dovato    Other medication(s) to be shipped: No additional medications requested for fill at this time     Jason Donovan, DOB: [birth year]  Phone: [phone] (home)       All above HIPAA information was verified with patient.     Was a Nurse, learning disability used for this call? No    Completed refill call assessment today to schedule patient's medication shipment from the [facility] and Home Delivery Pharmacy  [phone]).  All relevant notes have been reviewed.     Specialty medication(s) and dose(s) confirmed: Regimen is correct and unchanged.   Changes to medications: [name] reports no changes at this time.  Changes to insurance: No  New side effects reported not previously addressed with a pharmacist or physician: None reported  Questions for the pharmacist: No    Confirmed patient received a Conservation officer, historic buildings and a Surveyor, mining with first shipment. The patient will receive a drug information handout for each medication shipped and additional FDA Medication Guides as required.       DISEASE/MEDICATION-SPECIFIC INFORMATION        N/A    SPECIALTY MEDICATION ADHERENCE     Medication Adherence    Patient reported X missed doses in the last month: 0  Specialty Medication: Dovato 50-300mg  QD  Patient is on additional specialty medications: No  Patient is on more than two specialty medications: No  Any gaps in refill history greater than 2 weeks in the last 3 months: no  Demonstrates understanding of importance of adherence: yes              Were doses missed due to medication being on hold? No    DOVATO 50-300  mg: 4 days of medicine on hand       REFERRAL TO PHARMACIST     Referral to the pharmacist: Not needed      [facility]     Shipping address confirmed in Epic.       Delivery Scheduled: Yes, Expected medication delivery date: 01/13/24.     Medication will be delivered via UPS to the prescription address in Epic WAM.    Moshe Salisbury   [facility] Specialty and Home Delivery Pharmacy  Specialty Technician

## 2024-01-12 NOTE — Unmapped (Signed)
 Jason Donovan 's Dovato shipment will be rescheduled as a result of inclement weather.     I have spoken with the patient  at [phone]  and communicated the delivery change. We will reschedule the medication for the delivery date that the patient agreed upon.  We have confirmed the delivery date as 01/16/24, via ups.

## 2024-01-13 MED FILL — DOVATO 50 MG-300 MG TABLET: ORAL | 30 days supply | Qty: 30 | Fill #10

## 2024-02-07 NOTE — Unmapped (Signed)
 [facility] Specialty and Home Delivery Pharmacy Refill Coordination Note    Jason Donovan, Jason Donovan: [birth year]  Phone: [phone] (home)       All above HIPAA information was verified with patient.         02/06/2024    10:37 AM   Specialty Rx Medication Refill Questionnaire   Which Medications would you like refilled and shipped? Dovato   Please list all current allergies: Levaquin, clindamyacin,androgel, trazadone, cymbalta, cephalexin   Have you missed any doses in the last 30 days? No   Have you had any changes to your medication(s) since your last refill? No   How many days remaining of each medication do you have at home? 9   Have you experienced any side effects in the last 30 days? No   Please enter the full address (street address, city, state, zip code) where you would like your medication(s) to be delivered to. [address] [place], [place] [address]   Please specify on which day you would like your medication(s) to arrive. Note: if you need your medication(s) within 3 days, please call the pharmacy to schedule your order at [phone]  02/03/2024   Has your insurance changed since your last refill? No   Would you like a pharmacist to call you to discuss your medication(s)? No   Do you require a signature for your package? (Note: if we are billing Medicare Part B or your order contains a controlled substance, we will require a signature) No   I have been provided my out of pocket cost for my medication and approve the pharmacy to charge the amount to my credit card on file. Yes         Completed refill call assessment today to schedule patient's medication shipment from the [facility] and Home Delivery Pharmacy [phone]).  All relevant notes have been reviewed.       Confirmed patient received a Conservation officer, historic buildings and a Surveyor, mining with first shipment. The patient will receive a drug information handout for each medication shipped and additional FDA Medication Guides as required. REFERRAL TO PHARMACIST     Referral to the pharmacist: Not needed      [facility]     Shipping address confirmed in Epic.     Delivery Scheduled: Yes, Expected medication delivery date: 02/09/2024.     Medication will be delivered via UPS to the prescription address in Epic WAM.    Jason Donovan   [facility] Specialty and Home Delivery Pharmacy Specialty Technician

## 2024-02-08 MED FILL — DOVATO 50 MG-300 MG TABLET: ORAL | 30 days supply | Qty: 30 | Fill #11

## 2024-02-12 DIAGNOSIS — Z79899 Other long term (current) drug therapy: Principal | ICD-10-CM

## 2024-02-12 DIAGNOSIS — Z9189 Other specified personal risk factors, not elsewhere classified: Principal | ICD-10-CM

## 2024-02-12 DIAGNOSIS — B20 Human immunodeficiency virus [HIV] disease: Principal | ICD-10-CM

## 2024-02-12 DIAGNOSIS — Z1159 Encounter for screening for other viral diseases: Principal | ICD-10-CM

## 2024-02-12 DIAGNOSIS — Z0184 Encounter for antibody response examination: Principal | ICD-10-CM

## 2024-02-12 DIAGNOSIS — Z5181 Encounter for therapeutic drug level monitoring: Principal | ICD-10-CM

## 2024-02-16 ENCOUNTER — Ambulatory Visit
Admit: 2024-02-16 | Discharge: 2024-02-17 | Payer: MEDICARE | Attending: Infectious Disease | Primary: Infectious Disease

## 2024-02-16 DIAGNOSIS — F325 Major depressive disorder, single episode, in full remission: Principal | ICD-10-CM

## 2024-02-16 DIAGNOSIS — R5382 Chronic fatigue, unspecified: Principal | ICD-10-CM

## 2024-02-16 DIAGNOSIS — F3289 Other specified depressive episodes: Principal | ICD-10-CM

## 2024-02-16 DIAGNOSIS — B20 Human immunodeficiency virus [HIV] disease: Principal | ICD-10-CM

## 2024-02-16 DIAGNOSIS — R413 Other amnesia: Principal | ICD-10-CM

## 2024-02-16 MED ORDER — DOVATO 50 MG-300 MG TABLET
ORAL_TABLET | Freq: Every day | ORAL | 3 refills | 90 days | Status: CP
Start: 2024-02-16 — End: ?
  Filled 2024-03-05: qty 30, 30d supply, fill #0

## 2024-02-16 NOTE — Unmapped (Addendum)
 Assessment/Plan:         Statin - discussed in detail - he does not want to start. 1:5 risk of cardiac event in 10 years      Human immunodeficiency virus (HIV) disease (RAF-HCC)  Dolutegravir 50 mg/3TC 300 mg FDC started approximately 6 weeks ago.  Increased energy - planning for move in June 2025  His episodes of profound fatigue still wax and wane.  Also issues with memory at times likely related to mild HIV associated neurocognitive disease (HAND).    3.24.25 HIV RNA not detected cr 0.99 other safety labs also normal  09/06/23 HIV RNA not detected CD4 517 CD4:CD8 ratio is 1.0, cr 1,41 other safety labs normal  02/14/23 HIV RNA not detected.   11/09/22 HIV RNA < 40 not detected, CD4 529 RPR and Quant Gold negative,   12/07/21 HIV RNA < 40 not detected, CD4 608  05/21/21 = . HIV RNA not detected. Last CD4 552 with slightly low ratio - 0.8 good immune recovery.     He has remained well suppressed on Dolutegravir/ lamivudine (Dovato) despite very distant history on two drug therapy with ZDV/3TC and D4T/3TC.  This therapy would be very standard and easy to obtain anywhere in [place].     Past treatment history: He had vivid dreams thought secondary to [facility] - resolved when we when from tenofovir DF/emtricitabine to TDF plus 3TC and now tenofovir alefenamide 25 mg daily.  We have no HIV genotype data on him but he did have single and dual NRTI therapy in the 1990's.  We replaced atazanavir/cobsistat with dolutegravir -       The patient is on a medication (dolutegravir (Tivicay)) that blocks creatinine secretion.  The patient's creatinine level may increase by 0.1 to 0.4 mg/dL.  This is not a decline in GFR but will impact estimated GRF.  This increase occurs rapidly; within 2 weeks of starting the new medication.     Hyperlipidemia. ASCVD Risk - 24.2%  -as above recommended statin therapy. He has not been willing to start statin..  This was discussed in detail in the past- has not wanted to take a statin - he understands risk   The 10-year ASCVD risk score (Arnett DK, et al., 2019) is: 24.2%    Values used to calculate the score:      Age: 72 years      Sex: Male      Is Non-Hispanic African American: No      Diabetic: No      Tobacco smoker: No      Systolic Blood Pressure: 153 mmHg      Is BP treated: Yes      HDL Cholesterol: 47 mg/dL      Total Cholesterol: 213 mg/dL    Note: For patients with SBP <90 or >200, Total Cholesterol <130 or >320, HDL <20 or >100 which are outside of the allowable range, the calculator will use these upper or lower values to calculate the patient???s risk score.      Memory issues  Stable -Less time on piano. Has some difficulty concentration and learning new tasks.  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     Fatigue  Improved. Upbeat and active     Depression (Improved)  Psychological condition stable - on Wellbutrin and escitalopram  Regular aerobic exercise - walking 1-2 times per day.  Much improved  Psychological condition  will be reassessed in 6 months.   Improved - no suicidal ideation  Has periods on decreased energy and anhedonia    Osteoarthritis of hand - reviewed stable  Responded to single injection of MCP joint on R 2015. Has not required further injections but still has intermittent pain. infrequent injections are OK on cobicistat if there is significant benefit.   Hand are good - arthritis improved. No recent injections  - playing piano with few limitations  5th digits  On each hand more of a problem MCP 1st, joints better.     Essential hypertension  His LMD has stopped his antihypertensive    Anxiety Uses lorazepam 0.5 mg for sleep - several times per week    Diverticulitis   Surgery 01/23/19 with good success and rapid recovery. THough he had a recent recurrence  - resolved.     Shoulder/Neck pain   Not a complaint today  Now a radicular component on the right - worse when he plays piano.  He is using cervical traction and occasional ibuprofen. Improving. Working with Systems analyst - which has led to continued improvement.     Additional Comments:    Urinary frequency   - improve post recent prostate surgery    Lumbar stenosis   Back pain with L leg radiation - had MRI with significant stenosis.  Uses back brace.     Eczema  - recurrent on the chests clobetasol refilled  - lesions respond but recur. - very photo sensitive - improved with better sun protection  We will try topical tacrolimus but he has agreed to see Dermatologist.       Papular lesion mid-back - Cherry angioma that is unchanged.  No other worrisome lesions     Decrease hearing in L ear and itching R ear (not changed)  Unchanged. Low frequency hum and higher frequency tinnitus  Unchanged. Recent audiology and ENT exam - no specific therapy offered.      Subjective:      Chief Complaint   HIV follow-up  Decreased attention span persists but improved    HPI  Return patient visit for Jason Donovan, a 68 y.o. male who returns for a HIV follow-up.        Improved fatigue, moving to [place]  No fever, no diarrhea, some nausea,   Much more active.     Cervical radiculopathy - improved.   Uses traction on back and neck    No symptoms of depression    Persistent decrease in hearing in L ear - chronic. High frequeny tinnitus that comes and goes. But not a complaint today.      Past Medical History:   Diagnosis Date    Depression     Diverticulitis     HIV disease     Prostatitis        Allergies and Medications  Reviewed and updated today. HIV medications are listed in Assessment and Plan  Immunizations  Immunization History   Administered Date(s) Administered    COVID-19 VAC,BIVALENT,MODERNA(BLUE CAP) 07/29/2021    COVID-19 VACC,MRNA,(PFIZER)(PF) 02/14/2020, 03/10/2020, 04/04/2020, 07/10/2020    Hepatitis A (Adult) 06/21/2001, 12/27/2001    Hepatitis A Vaccine - Unspecified Formulation 06/21/2001, 12/27/2001 Hepatitis A Vaccine Pediatric / Adolescent 2 Dose IM 06/21/2001, 12/27/2001    Meningococcal Conjugate MCV4P 09/07/2019, 12/04/2019    PNEUMOCOCCAL POLYSACCHARIDE 23-VALENT 07/25/2003, 08/22/2008    PPD Test 03/23/2007    Pneumococcal Conjugate 13-Valent 10/12/2012    Pneumococcal Conjugate 20-valent 05/21/2021  Pneumococcal, Unspecified Formulation 07/25/2003, 08/22/2008    TD(TDVAX),ADSORBED,2LF(IM)(PF) 03/24/2017    TdaP 12/22/2006     Social History  General -  .  .  .  mood is better  He is excited about planned move to [place] - has more money due to work pension  Sexual History -  .  .  .  .  .  .  .  .  Marland KitchenHe has a single male sex partner with whom he has sex very infrequently none recently.  He describes low risk sex. Oral sex only  Substance Use -  .  .  .  .  .  .  .Non smoker, glass of wine per day.   Review of Systems  As per HPI. Remainder of systems reviewed,as above  No fever chills, cough SOB or CP. NO N/V/D, no abdominal.      Objective:    BP 153/90 (BP Site: L Arm, BP Position: Sitting, BP Cuff Size: Medium)  - Pulse 76  - Temp 36.9 ??C (98.4 ??F) (Oral)  - Ht 177.8 cm (5' 10)  - Wt 76 kg (167 lb 9.6 oz)  - BMI 24.05 kg/m??      Well appearing, conversant, more focused - blood pressure up slightly off his antiphypertensive - repeat by me 144/90  Poor dentition but no obvious infection. No oral lesions  Multiple papular, erthematous lesions, excoriated on scalp. Some appear follicular. Very similar to previous.  Normal gait  No peripheral edema    Most recent labs include:   [place]:   Absolute CD4 Count (/uL)   Date Value Status   10/05/2018 634 Final   09/22/2017 615 Final   07/08/2016 685 Final   05/08/2015 576 Final   09/19/2014 474 (L) Final   12/27/2013 527 Final   09/20/2013 559 Final   03/01/2013 399 (L) Final     CD4% (T Helper)   Date Value Status   10/05/2018 31 % (L) Final   09/22/2017 29 % (L) Final   07/08/2016 30 % (L) Final   05/08/2015 26 % (L) Final   09/19/2014 26 % (L) Final 12/27/2013 26 % (L) Final   09/20/2013 28 % (L) Final   03/01/2013 33 % (L) Final     HIV RNA Quant Result (no units)   Date Value Status   05/21/2021 Not Detected Final   10/05/2018 Not Detected Final   03/30/2018 Not Detected Final   09/22/2017 Not Detected Final   09/19/2014 Not Detected Final   05/09/2014 Not Detected Final   12/27/2013 Not Detected Final   09/20/2013 Not Detected Final     HIV RNA (copies/mL)   Date Value Status   02/13/2024 <20 Final   09/06/2023 <20 Final   02/14/2023 <20 Final   11/09/2022 <40 Final      Lab Results   Component Value Date    WBC 7.1 02/13/2024    Hemoglobin A1c 5.5 09/06/2023    Platelet 214 02/13/2024    HGB 15.6 02/13/2024    Creatinine 0.99 02/13/2024    Creatinine 1.41 (H) 09/06/2023    Creatinine 1.17 02/14/2023    Creatinine 1.21 11/09/2022    AST 21 02/13/2024    ALT 18 02/13/2024    ALT 20 09/06/2023    ALT 27 11/09/2022    ALT 20 06/25/2022    C. Trach Source THROAT 05/27/2011    Chlamydia trachomatis, NAA NEGATIVE 05/27/2011    GC Source THROAT 05/27/2011  Gonorrhoeae NAA NEGATIVE 05/27/2011    Cholesterol, Total 213 (H) 11/09/2022    Triglycerides 173 (H) 11/09/2022    Non-HDL Cholesterol 123 03/30/2018    HDL 47 11/09/2022    LDL Calculated 135 (H) 11/09/2022          Prevention, adherence and health education   .  .  .  .  .  .  .Adherence discussed. Exercise is walking, no longer swimming.     COVID Prevention and Interventions:  We discussed the current COVID pandemic and strategies to avoid infection and what to do if the patient becomes symptomatic.  We discussed if the patient becomes ill with fever and respiratory illness -that they should be tested.    We discussed the importance of being tested for SARS-CoV-2 if symptomatic and discussed antiviral treatment, Paxlovid would be optimal,  if symptomatic with COVID 19. The patient is very high risk.     Reasons to visit the emergency department include SOB, confusion, lightheadedness when standing. I personally spent 28 minutes face-to-face and non-face-to-face in the care of this patient, which includes all pre, intra, and post visit time on the date of service.  All documented time was specific to the E/M visit and does not include any procedures that may have been performed.  Prolonged discussion about obtaining therapy in [place]      Health maintenance  Reviewed immunization status.   refuses flu vaccine . Pneumococcal up to date   Hepatitis B - His surface antigen QUAL is equivocal - Via MyChart I recommended HBV vaccine with Heplisav-b  Reviewed cancer screening needs.  Managed by primary care provider.        Mammography Male patient  RPR performed within the prior year.Negative 09/06/23  Anal Pap needed but deferred until future visit. No recent testing.  Screening HCV Ab deferred until future visit. Negative 06/19/20  Lipid Screening.  Managed by primary care provider.   A1C 5.5 in Oct 2024  - Primary care provider.     Disposition  Return to clinic in 6 months or sooner if needed.- he may not return given move to [place]

## 2024-02-16 NOTE — Unmapped (Signed)
 Name: Julien Girt  Date: 02/16/2024  Address: [address]  [place]  [address]   [place] of Residence:  [place]  Phone: [phone]       Started assessment with patient in clinic        Housing Status  Stable/Permanent    Insurance  Medicare Part C    ___________________________________________________________________    The patient provides verbal consent and agrees to the following:    Patient declines [facility] services including cap on charges and does not want to provide income information that is required for eligibility. Patient understands that if they wish to receive [employer]ly services or benefits in the future, they can provide documentation to the [facility] and their eligibility will be reviewed at that date.          Reason for Declining: Patient states he is moving to [place] soon.                Mickle Asper,  Benefits & Eligibility Coordinator  Time of Intervention: 2 minutes

## 2024-02-17 LAB — HIV RNA, QUANTITATIVE, PCR: HIV RNA: <20 {copies}/mL

## 2024-02-17 LAB — CBC W/ DIFFERENTIAL
BANDED NEUTROPHILS ABSOLUTE COUNT: 0 10*3/uL (ref 0.0–0.1)
BASOPHILS ABSOLUTE COUNT: 0.1 10*3/uL (ref 0.0–0.2)
BASOPHILS RELATIVE PERCENT: 1 %
EOSINOPHILS ABSOLUTE COUNT: 0.1 10*3/uL (ref 0.0–0.4)
EOSINOPHILS RELATIVE PERCENT: 1 %
HEMATOCRIT: 46 % (ref 37.5–51.0)
HEMOGLOBIN: 15.6 g/dL (ref 13.0–17.7)
IMMATURE GRANULOCYTES: 0 %
LYMPHOCYTES ABSOLUTE COUNT: 1.9 10*3/uL (ref 0.7–3.1)
LYMPHOCYTES RELATIVE PERCENT: 27 %
MEAN CORPUSCULAR HEMOGLOBIN CONC: 33.9 g/dL (ref 31.5–35.7)
MEAN CORPUSCULAR HEMOGLOBIN: 31 pg (ref 26.6–33.0)
MEAN CORPUSCULAR VOLUME: 92 fL (ref 79–97)
MONOCYTES ABSOLUTE COUNT: 0.4 10*3/uL (ref 0.1–0.9)
MONOCYTES RELATIVE PERCENT: 6 %
NEUTROPHILS ABSOLUTE COUNT: 4.6 10*3/uL (ref 1.4–7.0)
NEUTROPHILS RELATIVE PERCENT: 65 %
PLATELET COUNT: 214 10*3/uL (ref 150–450)
RED BLOOD CELL COUNT: 5.03 x10E6/uL (ref 4.14–5.80)
RED CELL DISTRIBUTION WIDTH: 12.5 % (ref 11.6–15.4)
WHITE BLOOD CELL COUNT: 7.1 10*3/uL (ref 3.4–10.8)

## 2024-02-17 LAB — HEPATITIS B SURFACE ANTIGEN: HEPATITIS B SURFACE ANTIGEN: NEGATIVE

## 2024-02-17 LAB — BASIC METABOLIC PANEL
BLOOD UREA NITROGEN: 16 mg/dL (ref 8–27)
BUN / CREAT RATIO: 16 (ref 10–24)
CALCIUM: 9.8 mg/dL (ref 8.6–10.2)
CHLORIDE: 102 mmol/L (ref 96–106)
CO2: 25 mmol/L (ref 20–29)
CREATININE: 0.99 mg/dL (ref 0.76–1.27)
EGFR: 83 mL/min/{1.73_m2}
GLUCOSE: 110 mg/dL — ABNORMAL HIGH (ref 70–99)
POTASSIUM: 4.2 mmol/L (ref 3.5–5.2)
SODIUM: 144 mmol/L (ref 134–144)

## 2024-02-17 LAB — BILIRUBIN, TOTAL: BILIRUBIN TOTAL (MG/DL) IN SER/PLAS: 0.3 mg/dL (ref 0.0–1.2)

## 2024-02-17 LAB — HEPATITIS B CORE ANTIBODY, TOTAL: HEPATITIS B CORE TOTAL ANTIBODY: NEGATIVE

## 2024-02-17 LAB — ALT: ALT (SGPT): 18 IU/L (ref 0–44)

## 2024-02-17 LAB — AST: AST (SGOT): 21 IU/L (ref 0–40)

## 2024-02-17 LAB — HEPATITIS B SURFACE ANTIBODY: HEP B SURFACE AB, QUAL: UNDETERMINED

## 2024-02-28 NOTE — Unmapped (Signed)
 [facility] Specialty and Home Delivery Pharmacy Refill Coordination Note    Jason Donovan, Jason Donovan: 05-Aug-1956  Phone: [phone] (home)       All above HIPAA information was verified with patient.         02/27/2024     2:35 PM   Specialty Rx Medication Refill Questionnaire   Which Medications would you like refilled and shipped? Dovato, 12 days   Please list all current allergies: Levaquin, clindamycin, androgel, trazadone, cymbalta, cephalexin   Have you missed any doses in the last 30 days? No   Have you had any changes to your medication(s) since your last refill? No   How many days remaining of each medication do you have at home? 12   Have you experienced any side effects in the last 30 days? No   Please enter the full address (street address, city, state, zip code) where you would like your medication(s) to be delivered to. [address], [place], [place] [address]   Please specify on which day you would like your medication(s) to arrive. Note: if you need your medication(s) within 3 days, please call the pharmacy to schedule your order at [phone]  03/05/2024   Has your insurance changed since your last refill? No   Would you like a pharmacist to call you to discuss your medication(s)? No   Do you require a signature for your package? (Note: if we are billing Medicare Part B or your order contains a controlled substance, we will require a signature) No   I have been provided my out of pocket cost for my medication and approve the pharmacy to charge the amount to my credit card on file. Yes         Completed refill call assessment today to schedule patient's medication shipment from the [facility] and Home Delivery Pharmacy [phone]).  All relevant notes have been reviewed.       Confirmed patient received a Conservation officer, historic buildings and a Surveyor, mining with first shipment. The patient will receive a drug information handout for each medication shipped and additional FDA Medication Guides as required.         REFERRAL TO PHARMACIST     Referral to the pharmacist: Not needed      [facility]     Shipping address confirmed in Epic.     Delivery Scheduled: Yes, Expected medication delivery date: 03/06/2024.     Medication will be delivered via UPS to the prescription address in Epic WAM.    Jason Donovan   [facility] Specialty and Home Delivery Pharmacy Specialty Technician

## 2024-03-01 ENCOUNTER — Ambulatory Visit: Admit: 2024-03-01

## 2024-03-01 NOTE — Unmapped (Signed)
 [facility]  [address]  [place], [place] [address]  PHONE: [phone]  FAX: [phone]     CLINICAL PHARMACIST PRACTITIONER NOTE   INFECTIOUS DISEASES    Referring Infectious Diseases Specialist: Edmond Gosselin, MD  Primary Care Provider: Thomasine Flick, MD    Reason for Call: HIV Medication Therapy Management (MTM)    History of Present Illness: Jason Donovan is a 68 y.o. year old male living with HIV who will be moving to [place] and is inquiring about obtaining several months supply of his Dovato . He is also inquiring about how to acquire Dovato  in [place] once he moves there.     Allergies:   Allergies   Allergen Reactions    Clindamycin Hcl (Bulk) Other (See Comments)     Can't remember    Duloxetine  Other (See Comments)    Duloxetine  Hcl Other (See Comments)     fatigue    Levaquin [Levofloxacin] Other (See Comments)     Severe tendonitis    Tamsulosin Other (See Comments)     Hypotension    Trazodone (Bulk) Other (See Comments)     priapism    Viagra [Sildenafil] Other (See Comments)     Anxiety, palpitations, no effect of drug    Androgel [Testosterone] Anxiety and Palpitations     Felt like was going to pass out    Cephalexin Nausea Only     Other reaction(s): Abdominal Pain       PMH/PSH:   Past Medical History:   Diagnosis Date    Depression     Diverticulitis     HIV disease     Prostatitis        HIV Care:  HIV Viral Load:   Lab Results   Component Value Date    HIVRS Not Detected 05/21/2021    HIVRS Not Detected 10/05/2018    HIVRS Not Detected 03/30/2018    HIVRS Not Detected 09/22/2017    HIVCP <20 02/13/2024    HIVCP <20 09/06/2023    HIVCP <20 02/14/2023    HIVCP <40 11/09/2022    HIV10 CANCELED 02/13/2024    HIV10 CANCELED 09/06/2023    HIV10 CANCELED 02/14/2023    HIV10 CANCELED 11/09/2022      CD4 Count:   Lab Results   Component Value Date    ACD4 634 10/05/2018    ACD4 615 09/22/2017    ACD4 685 07/08/2016    ACD4 576 05/08/2015     CD4%:   Lab Results   Component Value Date    CD4 31 (L) 10/05/2018    CD4 29 (L) 09/22/2017    CD4 30 (L) 07/08/2016    CD4 26 (L) 05/08/2015     Hepatitis C:   Lab Results   Component Value Date    HEPCAB Nonreactive 09/22/2017    HEPCAB Nonreactive 05/08/2015      Hepatitis B:   Lab Results   Component Value Date    HBSAG Negative 02/13/2024        Current ART:   CURRENT OUTPATIENT ADMINISTERED ANTIRETROVIRAL MEDICATIONS   Medication Sig Dispense Refill    dolutegravir-lamiVUDine  (DOVATO ) 50-300 mg Tab Take 1 tablet by mouth daily. 90 tablet 3       Current Non-ART Medications:     ascorbic acid, vitamin C, (VITAMIN C) 500 MG tablet, Take 2 tablets (1,000 mg total) by mouth daily., Disp: , Rfl:     buPROPion  (WELLBUTRIN  SR) 150 MG 12 hr tablet,  Take 1 tablet (150 mg total) by mouth two (2) times a day., Disp: 180 tablet, Rfl: 0    clobetasol  (TEMOVATE ) 0.05 % ointment, Apply topically two (2) times a day., Disp: 15 g, Rfl: 3    cyanocobalamin 2000 MCG tablet, Take 1 tablet (2,000 mcg total) by mouth daily., Disp: , Rfl:     diphenoxylate-atropine (LOMOTIL) 2.5-0.025 mg per tablet, Take 1 tablet by mouth daily as needed., Disp: , Rfl:     escitalopram oxalate (LEXAPRO) 20 MG tablet, Take 0.5 tablets (10 mg total) by mouth two (2) times a day., Disp: , Rfl:     ibuprofen (ADVIL,MOTRIN) 200 MG tablet, Take 4 tablets (800 mg total) by mouth daily as needed., Disp: , Rfl:     LORazepam  (ATIVAN ) 1 MG tablet, Take 0.5 tablets (0.5 mg total) by mouth nightly., Disp: 15 tablet, Rfl: 3    omega-3 fatty acids-fish oil 360-1,200 mg cap, Take 3 tablets by mouth daily., Disp: , Rfl:     silodosin (RAPAFLO) 8 mg cap, silodosin, Disp: , Rfl:     tacrolimus  (PROTOPIC ) 0.03 % ointment, Apply topically two (2) times a day., Disp: 100 g, Rfl: 0    telmisartan (MICARDIS) 40 MG tablet, , Disp: , Rfl:     triamcinolone  (KENALOG ) 0.1 % cream, Apply topically two (2) times a day. As needed for sun allergy, Disp: 453.6 g, Rfl: 3      Assessment and Plan:   #HIV:   Jason Donovan had reached out to the ID clinic to inquire about Dovato  access in [place]. He is moving to [place], [place] in mid-June and wants to make sure he is able to still obtain the medication there with no interruption in medication access.   Dr. Bary Likes reached out to Carlotta Chew at [facility] who sent Dr. Bary Likes the following message:  Access to HIV care is free for all, including undocumented migrants, with free treatment and care provided regardless of the stage of infection, including virology and hepatologist consultations, blood tests, and antiretroviral treatments. My contact is connecting with the [place] team to get greater clarification on the administrative steps and additional local information. He did say HIV in [place] HIV is managed through hospital specialist outpatient clinics. The Department of Internal Medicine at [facility] [place], [employer], [place], [place], is a place where you can receive HIV care. The address is: [address], [phone] [place].  I reached out to [place] at [place] to follow up on the information above and she confirmed the information above, in addition to informing me that one of her [place] [facility] contacts is asking one of the local providers in [place] if its ok to share the name and contact information of the patient (Jason Donovan). We are still waiting to hear back about that.   I conveyed this information to Jason Donovan via MyChart message and will be available to answer any questions and address any concerns he may have to the best of my ability.       Patient verbalized understanding of counseling. Provided contact information for any further questions/concerns.       Follow-up:  Next CPP follow up 03/08/2024    I spent a total of 10 minutes on the phone with the patient delivering clinical care and providing education/counseling.       Spence Dux, PharmD, BCIDP, CPP, AAHIVP  Clinical Pharmacist Practitioner - HIV/Infectious Diseases    [facility] INFECTIOUS DISEASES CLINIC  [address]  [place], [place]  [address]  P [phone]  F [phone]

## 2024-03-22 NOTE — Unmapped (Signed)
 [facility] Specialty and Home Delivery Pharmacy Refill Coordination Note    Specialty Medication(s) to be Shipped:   Infectious Disease: Dovato    Other medication(s) to be shipped: No additional medications requested for fill at this time     Jason Donovan, DOB: 1956/02/12  Phone: [phone] (home)       All above HIPAA information was verified with patient.     Was a Nurse, learning disability used for this call? No    Completed refill call assessment today to schedule patient's medication shipment from the [facility] and Home Delivery Pharmacy  [phone]).  All relevant notes have been reviewed.     Specialty medication(s) and dose(s) confirmed: Regimen is correct and unchanged.   Changes to medications: Jru reports no changes at this time.  Changes to insurance: No  New side effects reported not previously addressed with a pharmacist or physician: None reported  Questions for the pharmacist: No    Confirmed patient received a Conservation officer, historic buildings and a Surveyor, mining with first shipment. The patient will receive a drug information handout for each medication shipped and additional FDA Medication Guides as required.       DISEASE/MEDICATION-SPECIFIC INFORMATION        N/A    SPECIALTY MEDICATION ADHERENCE     Medication Adherence    Patient reported X missed doses in the last month: 0  Specialty Medication: Dovato 50-300mg  QD  Patient is on additional specialty medications: No  Informant: patient              Were doses missed due to medication being on hold? No    Dovato 50-300 mg: 14 days of medicine on hand       REFERRAL TO PHARMACIST     Referral to the pharmacist: Not needed      [facility]     Shipping address confirmed in Epic.     Cost and Payment: Patient has a $0 copay, payment information is not required.    Delivery Scheduled: Yes, Expected medication delivery date: 03/29/24.     Medication will be delivered via UPS to the prescription address in Epic WAM.    Riesa Chary, PharmD   [facility] Specialty and Home Delivery Pharmacy  Specialty Pharmacist

## 2024-03-28 MED FILL — DOVATO 50 MG-300 MG TABLET: ORAL | 30 days supply | Qty: 30 | Fill #1

## 2024-04-19 NOTE — Unmapped (Signed)
 [facility] Refill Coordination Note    Pt leaving for [place] 05/10/24, will need early fill and then 60 day supply    Jason Donovan, DOB: 1956/01/24  Phone: [phone] (home)       All above HIPAA information was verified with patient.         04/18/2024    10:16 AM   Specialty Rx Medication Refill Questionnaire   Which Medications would you like refilled and shipped? Dovato. Please apply for a vacation overide to send me two refills (60 days). I have discussed this with Yousef Lafi.   Please list all current allergies: Levaquin, clindamycin, androgel,trazadone, cephalexin   Have you missed any doses in the last 30 days? No   Have you had any changes to your medication(s) since your last refill? No   How many days remaining of each medication do you have at home? 30 (see additional comments below)   Have you experienced any side effects in the last 30 days? No   Please enter the full address (street address, city, state, zip code) where you would like your medication(s) to be delivered to. [address], [place], [place] [address]   Please specify on which day you would like your medication(s) to arrive. Note: if you need your medication(s) within 3 days, please call the pharmacy to schedule your order at [phone]  04/30/2024   Has your insurance changed since your last refill? No   Would you like a pharmacist to call you to discuss your medication(s)? No   Do you require a signature for your package? (Note: if we are billing Medicare Part B or your order contains a controlled substance, we will require a signature) No   I have been provided my out of pocket cost for my medication and approve the pharmacy to charge the amount to my credit card on file. Yes   Additional Comments: As discussed with Spence Dux, I am requesting a 60 day supply to give me a buffer to handle my relocation to [place] on June 19         Completed refill call assessment today to schedule patient's medication shipment from the [facility] [phone]).  All relevant notes have been reviewed.       Confirmed patient received a Conservation officer, historic buildings and a Surveyor, mining with first shipment. The patient will receive a drug information handout for each medication shipped and additional FDA Medication Guides as required.         REFERRAL TO PHARMACIST     Referral to the pharmacist: Not needed      [facility]     Shipping address confirmed in Epic.     Delivery Scheduled: Yes, Expected medication delivery date: 04/30/24.     Medication will be delivered via UPS to the prescription address in Epic WAM.    Riesa Chary, PharmD   [facility] Specialty Pharmacist

## 2024-04-24 NOTE — Unmapped (Signed)
 [facility]  [address]  [place], [place] [address]  PHONE: [phone]  FAX: [phone]     CLINICAL PHARMACIST PRACTITIONER NOTE   INFECTIOUS DISEASES    Referring Infectious Diseases Specialist: Edmond Gosselin, MD  Primary Care Provider: Thomasine Flick, MD    Reason for Call: HIV Medication Therapy Management (MTM)    History of Present Illness: Jason Donovan is a 68 y.o. year old male living with HIV who will be moving to [place] and is inquiring about obtaining several months supply of his Dovato. He is also inquiring about how to acquire Dovato in [place] once he moves there.     Allergies:   Allergies   Allergen Reactions    Clindamycin Hcl (Bulk) Other (See Comments)     Can't remember    Duloxetine Other (See Comments)    Duloxetine Hcl Other (See Comments)     fatigue    Levaquin [Levofloxacin] Other (See Comments)     Severe tendonitis    Tamsulosin Other (See Comments)     Hypotension    Trazodone (Bulk) Other (See Comments)     priapism    Viagra [Sildenafil] Other (See Comments)     Anxiety, palpitations, no effect of drug    Androgel [Testosterone] Anxiety and Palpitations     Felt like was going to pass out    Cephalexin Nausea Only     Other reaction(s): Abdominal Pain       PMH/PSH:   Past Medical History:   Diagnosis Date    Depression     Diverticulitis     HIV disease      Prostatitis        HIV Care:  HIV Viral Load:   Lab Results   Component Value Date    HIVRS Not Detected 02/13/2024    HIVRS Not Detected 05/21/2021    HIVRS Not Detected 10/05/2018    HIVRS Not Detected 03/30/2018    HIVRS Not Detected 09/22/2017    HIVCP <20 02/13/2024    HIVCP <20 09/06/2023    HIVCP <20 02/14/2023    HIVCP <40 11/09/2022    HIV10 CANCELED 02/13/2024    HIV10 CANCELED 09/06/2023    HIV10 CANCELED 02/14/2023    HIV10 CANCELED 11/09/2022      CD4 Count:   Lab Results   Component Value Date    ACD4 517 09/06/2023    ACD4 634 10/05/2018    ACD4 615 09/22/2017 ACD4 685 07/08/2016    ACD4 576 05/08/2015     CD4%:   Lab Results   Component Value Date    CD4 30.4 (L) 09/06/2023    CD4 31 (L) 10/05/2018    CD4 29 (L) 09/22/2017    CD4 30 (L) 07/08/2016    CD4 26 (L) 05/08/2015     Hepatitis C:   Lab Results   Component Value Date    HEPCAB Nonreactive 09/22/2017    HEPCAB Nonreactive 05/08/2015      Hepatitis B:   Lab Results   Component Value Date    HBSAG Negative 02/13/2024        Current ART:   CURRENT OUTPATIENT ADMINISTERED ANTIRETROVIRAL MEDICATIONS   Medication Sig Dispense Refill    dolutegravir-lamiVUDine (DOVATO) 50-300 mg Tab Take 1 tablet by mouth daily. 90 tablet 3       Current Non-ART Medications:     ascorbic acid, vitamin C, (VITAMIN C) 500 MG tablet, Take 2 tablets (1,000 mg total)  by mouth daily., Disp: , Rfl:     buPROPion (WELLBUTRIN SR) 150 MG 12 hr tablet, Take 1 tablet (150 mg total) by mouth two (2) times a day., Disp: 180 tablet, Rfl: 0    clobetasol (TEMOVATE) 0.05 % ointment, Apply topically two (2) times a day., Disp: 15 g, Rfl: 3    cyanocobalamin 2000 MCG tablet, Take 1 tablet (2,000 mcg total) by mouth daily., Disp: , Rfl:     diphenoxylate-atropine (LOMOTIL) 2.5-0.025 mg per tablet, Take 1 tablet by mouth daily as needed., Disp: , Rfl:     escitalopram oxalate (LEXAPRO) 20 MG tablet, Take 0.5 tablets (10 mg total) by mouth two (2) times a day., Disp: , Rfl:     ibuprofen (ADVIL,MOTRIN) 200 MG tablet, Take 4 tablets (800 mg total) by mouth daily as needed., Disp: , Rfl:     LORazepam (ATIVAN) 1 MG tablet, Take 0.5 tablets (0.5 mg total) by mouth nightly., Disp: 15 tablet, Rfl: 3    omega-3 fatty acids-fish oil 360-1,200 mg cap, Take 3 tablets by mouth daily., Disp: , Rfl:     silodosin (RAPAFLO) 8 mg cap, silodosin, Disp: , Rfl:     tacrolimus (PROTOPIC) 0.03 % ointment, Apply topically two (2) times a day., Disp: 100 g, Rfl: 0    telmisartan (MICARDIS) 40 MG tablet, , Disp: , Rfl:     triamcinolone (KENALOG) 0.1 % cream, Apply topically two (2) times a day. As needed for sun allergy, Disp: 453.6 g, Rfl: 3      Assessment and Plan:   #HIV:     04/24/2024  Confirmed that Ociel received information on how to obtain Dovato in [place].   Confirmed with [facility] that they have him set up for a refill to go out on Friday, June 6th.   [facility] will also request a 60 day override to ensure Mishael has enough Dovato to carry him over until he reestablishes care at the clinic in [place].     03/01/2024  Mr. Arquette had reached out to the ID clinic to inquire about Dovato access in [place]. He is moving to [place], [place] in mid-June and wants to make sure he is able to still obtain the medication there with no interruption in medication access.   Dr. Bary Likes reached out to Carlotta Chew at [facility] who sent Dr. Bary Likes the following message:  Access to HIV care is free for all, including undocumented migrants, with free treatment and care provided regardless of the stage of infection, including virology and hepatologist consultations, blood tests, and antiretroviral treatments. My contact is connecting with the [place] team to get greater clarification on the administrative steps and additional local information. He did say HIV in [place] HIV is managed through hospital specialist outpatient clinics. The Department of Internal Medicine at [facility] [place], [employer], [place], [place], is a place where you can receive HIV care. The address is: [address], [phone] [place].  I reached out to [place] at [place] [facility] to follow up on the information above and she confirmed the information above, in addition to informing me that one of her [place] [facility] contacts is asking one of the local providers in [place] if its ok to share the name and contact information of the patient (Mr. Elzey). We are still waiting to hear back about that.   I conveyed this information to Mr. Hlavac via MyChart message and will be available to answer any questions and address any concerns  he may have to the best of my ability.       Patient verbalized understanding of counseling. Provided contact information for any further questions/concerns.       I spent a total of 5 minutes on the phone with the patient delivering clinical care and providing education/counseling.       Spence Dux, PharmD, BCIDP, CPP, AAHIVP  Clinical Pharmacist Practitioner - HIV/Infectious Diseases    [facility]  [address]  [place], [place]  [address]  P [phone]  F [phone]

## 2024-04-27 MED FILL — DOVATO 50 MG-300 MG TABLET: ORAL | 30 days supply | Qty: 30 | Fill #2

## 2024-04-27 MED FILL — DOVATO 50 MG-300 MG TABLET: ORAL | 30 days supply | Qty: 30 | Fill #3

## 2024-04-27 NOTE — Unmapped (Signed)
 Specialty Medication(s): Dovato    Jason Donovan has been dis-enrolled from the [facility] specialty pharmacy services as a result of moving out of the country.    Additional information provided to the patient: patient will receive 2 month supply on 6/9 and is moving internationally on 6/19.    Riesa Chary, PharmD  [facility] Specialty Pharmacist
